# Patient Record
Sex: Male | Born: 1960 | Race: Black or African American | Hispanic: No | Marital: Single
Health system: Southern US, Community
[De-identification: ages and names within clinical notes are randomized; demographics above are authoritative.]

## PROBLEM LIST (undated history)

## (undated) DIAGNOSIS — I639 Cerebral infarction, unspecified: Secondary | ICD-10-CM

## (undated) DIAGNOSIS — I10 Essential (primary) hypertension: Secondary | ICD-10-CM

## (undated) HISTORY — PX: HAND SURGERY: SHX662

---

## 2003-09-18 ENCOUNTER — Emergency Department (HOSPITAL_COMMUNITY): Admission: EM | Admit: 2003-09-18 | Discharge: 2003-09-18 | Payer: Self-pay | Admitting: Emergency Medicine

## 2019-10-30 ENCOUNTER — Ambulatory Visit: Payer: Self-pay | Attending: Internal Medicine

## 2019-10-30 DIAGNOSIS — Z23 Encounter for immunization: Secondary | ICD-10-CM

## 2019-10-30 NOTE — Progress Notes (Signed)
   Covid-19 Vaccination Clinic  Name:  Chad Santos    MRN: [id number] DOB: [birth year]  10/30/2019  Chad Santos was observed post Covid-19 immunization for 15 minutes without incident. He was provided with Vaccine Information Sheet and instruction to access the V-Safe system.   Chad Santos was instructed to call 911 with any severe reactions post vaccine: [name] Difficulty breathing  . Swelling of face and throat  . A fast heartbeat  . A bad rash all over body  . Dizziness and weakness   Immunizations Administered    Name Date Dose VIS Date Route   Pfizer COVID-19 Vaccine 10/30/2019 10:25 AM 0.3 mL 07/04/2019 Intramuscular   Manufacturer: [place]   Lot: [id number]   [place]: [id number]

## 2019-11-24 ENCOUNTER — Ambulatory Visit: Payer: Self-pay | Attending: Internal Medicine

## 2019-11-24 DIAGNOSIS — Z23 Encounter for immunization: Secondary | ICD-10-CM

## 2019-11-24 NOTE — Progress Notes (Signed)
   Covid-19 Vaccination Clinic  Name:  Mubashir Gere    MRN: [id number] DOB: 17-Mar-1961  11/24/2019  Mr. Fresquez was observed post Covid-19 immunization for 15 minutes without incident. He was provided with Vaccine Information Sheet and instruction to access the V-Safe system.   Mr. Mazzoli was instructed to call 911 with any severe reactions post vaccine: [name] Difficulty breathing  . Swelling of face and throat  . A fast heartbeat  . A bad rash all over body  . Dizziness and weakness   Immunizations Administered    Name Date Dose VIS Date Route   Pfizer COVID-19 Vaccine 11/24/2019 10:06 AM 0.3 mL 09/17/2018 Intramuscular   Manufacturer: [place]   Lot: [id number]   [place]: [id number]

## 2020-07-10 ENCOUNTER — Other Ambulatory Visit: Payer: Self-pay

## 2020-07-10 ENCOUNTER — Emergency Department (HOSPITAL_COMMUNITY): Payer: Medicaid Other

## 2020-07-10 ENCOUNTER — Encounter (HOSPITAL_COMMUNITY): Payer: Self-pay

## 2020-07-10 ENCOUNTER — Emergency Department (HOSPITAL_COMMUNITY)
Admission: EM | Admit: 2020-07-10 | Discharge: 2020-07-11 | Disposition: A | Discharge status: Home / Self Care | Payer: Medicaid Other | Attending: Emergency Medicine | Admitting: Emergency Medicine

## 2020-07-10 DIAGNOSIS — M169 Osteoarthritis of hip, unspecified: Secondary | ICD-10-CM

## 2020-07-10 DIAGNOSIS — Z5321 Procedure and treatment not carried out due to patient leaving prior to being seen by health care provider: Secondary | ICD-10-CM | POA: Insufficient documentation

## 2020-07-10 DIAGNOSIS — M25551 Pain in right hip: Secondary | ICD-10-CM | POA: Insufficient documentation

## 2020-07-10 HISTORY — DX: Osteoarthritis of hip, unspecified: M16.9

## 2020-07-10 NOTE — ED Triage Notes (Signed)
Patient complains of right hip pain x 2-3 days, today complains too painful to walk. Denies trauma

## 2020-07-11 NOTE — ED Notes (Signed)
Pt called for vitals no answer. °

## 2020-12-31 ENCOUNTER — Emergency Department (HOSPITAL_COMMUNITY)
Admission: EM | Admit: 2020-12-31 | Discharge: 2020-12-31 | Disposition: A | Discharge status: Home / Self Care | Payer: Medicaid Other | Attending: Emergency Medicine | Admitting: Emergency Medicine

## 2020-12-31 ENCOUNTER — Emergency Department (HOSPITAL_COMMUNITY): Payer: Medicaid Other

## 2020-12-31 ENCOUNTER — Other Ambulatory Visit: Payer: Self-pay

## 2020-12-31 DIAGNOSIS — G4700 Insomnia, unspecified: Secondary | ICD-10-CM | POA: Diagnosis present

## 2020-12-31 DIAGNOSIS — R519 Headache, unspecified: Secondary | ICD-10-CM | POA: Insufficient documentation

## 2020-12-31 DIAGNOSIS — F458 Other somatoform disorders: Secondary | ICD-10-CM | POA: Diagnosis not present

## 2020-12-31 LAB — CBC WITH DIFFERENTIAL/PLATELET
Abs Immature Granulocytes: 0.01 10*3/uL (ref 0.00–0.07)
Basophils Absolute: 0 10*3/uL (ref 0.0–0.1)
Basophils Relative: 1 %
Eosinophils Absolute: 0 10*3/uL (ref 0.0–0.5)
Eosinophils Relative: 1 %
HCT: 48.8 % (ref 39.0–52.0)
Hemoglobin: 16.1 g/dL (ref 13.0–17.0)
Immature Granulocytes: 0 %
Lymphocytes Relative: 28 %
Lymphs Abs: 1.8 10*3/uL (ref 0.7–4.0)
MCH: 31.1 pg (ref 26.0–34.0)
MCHC: 33 g/dL (ref 30.0–36.0)
MCV: 94.2 fL (ref 80.0–100.0)
Monocytes Absolute: 0.5 10*3/uL (ref 0.1–1.0)
Monocytes Relative: 8 %
Neutro Abs: 3.9 10*3/uL (ref 1.7–7.7)
Neutrophils Relative %: 62 %
Platelets: 212 10*3/uL (ref 150–400)
RBC: 5.18 MIL/uL (ref 4.22–5.81)
RDW: 14.2 % (ref 11.5–15.5)
WBC: 6.2 10*3/uL (ref 4.0–10.5)
nRBC: 0 % (ref 0.0–0.2)

## 2020-12-31 LAB — URINALYSIS, ROUTINE W REFLEX MICROSCOPIC
Bilirubin Urine: NEGATIVE
Glucose, UA: NEGATIVE mg/dL
Hgb urine dipstick: NEGATIVE
Ketones, ur: NEGATIVE mg/dL
Nitrite: NEGATIVE
Protein, ur: NEGATIVE mg/dL
Specific Gravity, Urine: 1.015 (ref 1.005–1.030)
pH: 6 (ref 5.0–8.0)

## 2020-12-31 LAB — BASIC METABOLIC PANEL
Anion gap: 9 (ref 5–15)
BUN: 12 mg/dL (ref 8–23)
CO2: 27 mmol/L (ref 22–32)
Calcium: 9.5 mg/dL (ref 8.9–10.3)
Chloride: 101 mmol/L (ref 98–111)
Creatinine, Ser: 1.2 mg/dL (ref 0.61–1.24)
GFR, Estimated: >60 mL/min (ref >60)
Glucose, Bld: 105 mg/dL — ABNORMAL HIGH (ref 70–99)
Potassium: 4.6 mmol/L (ref 3.5–5.1)
Sodium: 137 mmol/L (ref 135–145)

## 2020-12-31 MED ORDER — TRAZODONE HCL 50 MG PO TABS: 50.0000 mg | ORAL_TABLET | Freq: Every evening | ORAL | 0 refills | Status: DC | PRN

## 2020-12-31 NOTE — Discharge Instructions (Addendum)
I have given you a prescription medication called Trazodone for sleep. You may try an over the counter medication as well like Unisom. All of these medications can give you daytime sleepiness and you may wish to start with 1/2 a tablet. I would advise trying an UNISOM first and then if it is not working for you, filling the prescription medication. I have attached a print out about sleep hygiene as well. You may follow up with neurology or a primary care doctor for your symptoms. Your head  ct and labs were normal. Your blood pressure was elevated today and should be rechecked and managed by a primary care doctor.

## 2020-12-31 NOTE — ED Provider Notes (Signed)
[facility] EMERGENCY DEPART[facility] Provider Note   CSN: [id number] Arrival date & time: 12/31/20  0715     History Chief Complaint  Patient presents with   Insomnia    Chad Santos is a 60 y.o. male who presents emergency department with chief complaint of insomnia.  Patient states that 3 days ago he began having problems sleeping.  He states that he is able to get to sleep at about 10 PM every night but wakes up around 12 or 1 and that is unable to return to sleep.  This is never happened to him before.  He has tried melatonin without relief.  He denies any use of stimulants, alcohol, illicit drugs.  He does not drink coffee.  He denies any increased stress.  At the same time he began having frontal headaches which are new.  He denies any other neurologic abnormalities with these headaches and has not tried any medications to relieve the symptoms.  He does not currently have a headache. Patient has also experienced intermittent lobus sensation with swallowing.  This is also new since development of the insomnia.  He has no difficulty swallowing water and has no abnormal throat sensation at this time.  The history is provided by the patient.  Insomnia This is a new problem. The current episode started more than 2 days ago. The problem occurs constantly. The problem has not changed since onset.Associated symptoms include headaches. Pertinent negatives include no chest pain, no abdominal pain and no shortness of breath. Nothing aggravates the symptoms. Nothing relieves the symptoms. Treatments tried: with melatonin. The treatment provided no relief.      No past medical history on file.  There are no problems to display for this patient.   No past surgical history on file.     No family history on file.     Home Medications Prior to Admission medications   Not on File    Allergies    Patient has no known allergies.  Review of Systems   Review of Systems   Constitutional: Negative.   HENT:  Positive for trouble swallowing (intermittent globus sensation).   Eyes: Negative.   Respiratory:  Negative for shortness of breath.   Cardiovascular:  Negative for chest pain.  Gastrointestinal:  Negative for abdominal pain.  Endocrine: Negative.   Genitourinary: Negative.   Musculoskeletal: Negative.   Neurological:  Positive for headaches.  Psychiatric/Behavioral:  Positive for sleep disturbance. Negative for agitation and dysphoric mood. The patient has insomnia. The patient is not nervous/anxious.    Physical Exam Updated Vital Signs BP (!) 185/110 (BP Location: Left Arm)   Pulse 72   Temp 98.4 F (36.9 C) (Oral)   Resp 16   Ht 6\' 1"  (1.854 m)   Wt 81.6 kg   SpO2 100%   BMI 23.75 kg/m   Physical Exam Vitals and nursing note reviewed.  Constitutional:      General: He is not in acute distress.    Appearance: He is well-developed. He is not diaphoretic.  HENT:     Head: Normocephalic and atraumatic.     Mouth/Throat:     Mouth: Mucous membranes are moist.     Comments: No palpable thyroid mass or lymphadenopathy Eyes:     General: No scleral icterus.    Conjunctiva/sclera: Conjunctivae normal.     Pupils: Pupils are equal, round, and reactive to light.     Comments: No horizontal, vertical or rotational nystagmus  Neck:  Comments: Full active and passive ROM without pain No midline or paraspinal tenderness No nuchal rigidity or meningeal signs Cardiovascular:     Rate and Rhythm: Normal rate and regular rhythm.  Pulmonary:     Effort: Pulmonary effort is normal. No respiratory distress.     Breath sounds: Normal breath sounds. No wheezing or rales.  Abdominal:     General: Bowel sounds are normal.     Palpations: Abdomen is soft.     Tenderness: There is no abdominal tenderness. There is no guarding or rebound.  Musculoskeletal:        General: Normal range of motion.     Cervical back: Normal range of motion and neck  supple.  Lymphadenopathy:     Cervical: No cervical adenopathy.  Skin:    General: Skin is warm and dry.     Findings: No rash.  Neurological:     Mental Status: He is alert and oriented to person, place, and time.     Cranial Nerves: No cranial nerve deficit.     Motor: No abnormal muscle tone.     Coordination: Coordination normal.     Comments: Mental Status:  Alert, oriented, thought content appropriate. Speech fluent without evidence of aphasia. Able to follow 2 step commands without difficulty.  Cranial Nerves:  II:  Peripheral visual fields grossly normal, pupils equal, round, reactive to light III,IV, VI: ptosis not present, extra-ocular motions intact bilaterally  V,VII: smile symmetric, facial light touch sensation equal VIII: hearing grossly normal bilaterally  IX,X: midline uvula rise  XI: bilateral shoulder shrug equal and strong XII: midline tongue extension  Motor:  5/5 in upper and lower extremities bilaterally including strong and equal grip strength and dorsiflexion/plantar flexion Sensory: Pinprick and light touch normal in all extremities.  Cerebellar: normal finger-to-nose with bilateral upper extremities Gait: normal gait and balance CV: distal pulses palpable throughout   Psychiatric:        Behavior: Behavior normal.        Thought Content: Thought content normal.        Judgment: Judgment normal.    ED Results / Procedures / Treatments   Labs (all labs ordered are listed, but only abnormal results are displayed) Labs Reviewed - No data to display  EKG None  Radiology No results found.  Procedures Procedures   Medications Ordered in ED Medications - No data to display  ED Course  I have reviewed the triage vital signs and the nursing notes.  Pertinent labs & imaging results that were available during my care of the patient were reviewed by me and considered in my medical decision making (see chart for details).    MDM  Rules/Calculators/A&P                          Patient here with insomnia, HA and globus sensation. I suspect all of his sxs are related to his lack of sleep. He has a negative CT head which I reviewed and interpreted. Labs , cbc, bmp, wnl. Will dc with neuro/pcp f/u, hand out on sleep hygiene and rx for trazadone to fill if otc meds  and lifestyle modifications do not help. No active h/a at this time. Doubt Hyperthyroidism with mild bradycardia. BP elevated and patient informed to f/u with a pcp. Discussed return precautions.  Final Clinical Impression(s) / ED Diagnoses Final diagnoses:  None    Rx / DC Orders ED Discharge Orders  None        Margarita Mail, PA-C 12/31/20 [id number]    Davonna Belling, MD 12/31/20 [phone]

## 2020-12-31 NOTE — ED Notes (Signed)
Pt was given urinal. Unable to give UA at this time.

## 2020-12-31 NOTE — ED Triage Notes (Signed)
Pt reports insomnia x 3 nights and feeling as if he is unable to clear something out of his throat when he swallows x 2 days. Also endorses headache x 2 days, taking advil without relief.

## 2021-01-20 ENCOUNTER — Emergency Department (HOSPITAL_COMMUNITY)
Admission: EM | Admit: 2021-01-20 | Discharge: 2021-01-20 | Disposition: A | Discharge status: Home / Self Care | Payer: Medicaid Other | Attending: Emergency Medicine | Admitting: Emergency Medicine

## 2021-01-20 ENCOUNTER — Emergency Department (HOSPITAL_COMMUNITY): Payer: Medicaid Other

## 2021-01-20 DIAGNOSIS — R066 Hiccough: Secondary | ICD-10-CM | POA: Insufficient documentation

## 2021-01-20 DIAGNOSIS — R630 Anorexia: Secondary | ICD-10-CM | POA: Insufficient documentation

## 2021-01-20 DIAGNOSIS — R5383 Other fatigue: Secondary | ICD-10-CM | POA: Insufficient documentation

## 2021-01-20 LAB — COMPREHENSIVE METABOLIC PANEL
ALT: 11 U/L (ref 0–44)
AST: 14 U/L — ABNORMAL LOW (ref 15–41)
Albumin: 3.6 g/dL (ref 3.5–5.0)
Alkaline Phosphatase: 104 U/L (ref 38–126)
Anion gap: 10 (ref 5–15)
BUN: 16 mg/dL (ref 8–23)
CO2: 26 mmol/L (ref 22–32)
Calcium: 9.2 mg/dL (ref 8.9–10.3)
Chloride: 101 mmol/L (ref 98–111)
Creatinine, Ser: 1.52 mg/dL — ABNORMAL HIGH (ref 0.61–1.24)
GFR, Estimated: 52 mL/min — ABNORMAL LOW (ref >60)
Glucose, Bld: 94 mg/dL (ref 70–99)
Potassium: 3.8 mmol/L (ref 3.5–5.1)
Sodium: 137 mmol/L (ref 135–145)
Total Bilirubin: 0.6 mg/dL (ref 0.3–1.2)
Total Protein: 6.9 g/dL (ref 6.5–8.1)

## 2021-01-20 LAB — CBC
HCT: 46.8 % (ref 39.0–52.0)
Hemoglobin: 15.5 g/dL (ref 13.0–17.0)
MCH: 31 pg (ref 26.0–34.0)
MCHC: 33.1 g/dL (ref 30.0–36.0)
MCV: 93.6 fL (ref 80.0–100.0)
Platelets: 241 10*3/uL (ref 150–400)
RBC: 5 MIL/uL (ref 4.22–5.81)
RDW: 13.8 % (ref 11.5–15.5)
WBC: 6.2 10*3/uL (ref 4.0–10.5)
nRBC: 0 % (ref 0.0–0.2)

## 2021-01-20 LAB — URINALYSIS, ROUTINE W REFLEX MICROSCOPIC
Bilirubin Urine: NEGATIVE
Glucose, UA: NEGATIVE mg/dL
Hgb urine dipstick: NEGATIVE
Ketones, ur: NEGATIVE mg/dL
Nitrite: NEGATIVE
Protein, ur: NEGATIVE mg/dL
Specific Gravity, Urine: 1.023 (ref 1.005–1.030)
pH: 6 (ref 5.0–8.0)

## 2021-01-20 LAB — LIPASE, BLOOD: Lipase: 25 U/L (ref 11–51)

## 2021-01-20 LAB — TSH: TSH: 1.081 u[IU]/mL (ref 0.350–4.500)

## 2021-01-20 MED ORDER — SODIUM CHLORIDE 0.9 % IV BOLUS
1000.0000 mL | Freq: Once | INTRAVENOUS | Status: AC
  Administered 2021-01-20: 1000 mL via INTRAVENOUS

## 2021-01-20 NOTE — Social Work (Signed)
CSW met with Pt at bedside. Pt reports that he recently moved to [place] from [place] and has yet to establish with a PCP. Pt currently receives SSDI and has applied for Medicaid.  CSW gave resources for [facility]. Also added added info to AVS.

## 2021-01-20 NOTE — ED Triage Notes (Signed)
Pt c/o loss of appetite & hiccups x3 days. No additional complaints

## 2021-01-20 NOTE — ED Provider Notes (Signed)
Emergency Medicine Provider Triage Evaluation Note  Chad Santos , a 60 y.o. male  was evaluated in triage.  Pt complains of decreased appetite and hiccups x3 days.  Patient reports that he was able to eat some chicken yesterday without incident.  Patient reports that hiccups are intermittent.  Has associated epigastric pain when hiccups are present.  Pain does not radiate.  No alleviating or aggravating symptoms of symptoms.  Review of Systems  Positive: Decreased appetite, hiccups Negative: Fever, chills, URI symptoms, shortness of breath, chest pain, nausea, vomiting, diarrhea  Physical Exam  BP (!) 163/107 (BP Location: Right Arm)   Pulse 89   Temp 97.6 F (36.4 C) (Oral)   Resp 15   SpO2 99%  Gen:   Awake, no distress   Resp:  Normal effort  MSK:   Moves extremities without difficulty  Other:  Abdomen soft, nondistended, nontender  Medical Decision Making  Medically screening exam initiated at 3:44 PM.  Appropriate orders placed.  Chad Santos was informed that the remainder of the evaluation will be completed by another provider, this initial triage assessment does not replace that evaluation, and the importance of remaining in the ED until their evaluation is complete.  The patient appears stable so that the remainder of the work up may be completed by another provider.      Loni Beckwith, PA-C 01/20/21 1546    Dorie Rank, MD 01/21/21 [phone]

## 2021-01-20 NOTE — Discharge Instructions (Addendum)
Try to keep her self hydrated.  Your kidney function looks mildly increased from what it was before.  Your lab work was reassuring today otherwise however.

## 2021-01-20 NOTE — ED Notes (Signed)
Discharge instructions reviewed and explained, pt verbalized understanding.

## 2021-02-02 NOTE — ED Provider Notes (Signed)
[facility] EMERGENCY DEPARTMENT Provider Note   CSN: [id number] Arrival date & time: 01/20/21  1442     History Chief Complaint  Patient presents with   no appetite   Hiccups    Chad Santos is a 60 y.o. male.  HPI Patient presents with hiccups and loss of appetite.  He has not run for 3 days.  No abdominal pains.  No lightheadedness or dizziness.  Has had some fatigue.  No headache.  No confusion.  Did have some issues with sleeping recently and started on trazodone to take as needed.  No weight loss.    No past medical history on file.  There are no problems to display for this patient.   No past surgical history on file.     No family history on file.     Home Medications Prior to Admission medications   Medication Sig Start Date End Date Taking? Authorizing Provider  Ibuprofen (ADVIL PO) Take 1 tablet by mouth daily as needed (pain).    [provider]  traZODone (DESYREL) 50 MG tablet Take 1 tablet (50 mg total) by mouth at bedtime as needed for up to 20 days for sleep. 12/31/20 01/20/21  Margarita Mail, PA-C    Allergies    Patient has no known allergies.  Review of Systems   Review of Systems  Constitutional:  Positive for appetite change.  HENT:  Negative for congestion.   Respiratory:  Negative for shortness of breath.   Cardiovascular:  Negative for chest pain.  Gastrointestinal:  Negative for abdominal pain.       Hiccups  Genitourinary:  Negative for flank pain.  Musculoskeletal:  Negative for back pain.  Neurological:  Negative for weakness.  Psychiatric/Behavioral:  Negative for confusion.    Physical Exam Updated Vital Signs BP (!) 164/98 (BP Location: Right Arm)   Pulse (!) 56   Temp 98.6 F (37 C) (Oral)   Resp 16   SpO2 100%   Physical Exam  ED Results / Procedures / Treatments   Labs (all labs ordered are listed, but only abnormal results are displayed) Labs Reviewed  COMPREHENSIVE METABOLIC PANEL -  Abnormal; Notable for the following components:      Result Value   Creatinine, Ser 1.52 (*)    AST 14 (*)    GFR, Estimated 52 (*)    All other components within normal limits  URINALYSIS, ROUTINE W REFLEX MICROSCOPIC - Abnormal; Notable for the following components:   Color, Urine AMBER (*)    APPearance HAZY (*)    Leukocytes,Ua MODERATE (*)    Bacteria, UA RARE (*)    All other components within normal limits  LIPASE, BLOOD  CBC  TSH    EKG None  Radiology No results found.  Procedures Procedures   Medications Ordered in ED Medications  sodium chloride 0.9 % bolus 1,000 mL (0 mLs Intravenous Stopped 01/20/21 1940)    ED Course  I have reviewed the triage vital signs and the nursing notes.  Pertinent labs & imaging results that were available during my care of the patient were reviewed by me and considered in my medical decision making (see chart for details).    MDM Rules/Calculators/A&P                          Patient presents with hiccups.  Has had for the last few days.  Work-up reassuring.  Benign exam.  Chest x-ray negative.  Recent negative head CT.  Will need outpatient follow-up.  Discussed with patient about possibility of occult malignancy.  TSH reassuring.  Only few hiccups here.  Discharge home  Final Clinical Impression(s) / ED Diagnoses Final diagnoses:  Hiccups  Loss of appetite    Rx / DC Orders ED Discharge Orders     None        Davonna Belling, MD 02/02/21 [phone]

## 2021-05-16 ENCOUNTER — Other Ambulatory Visit: Payer: Self-pay

## 2021-05-16 ENCOUNTER — Ambulatory Visit (HOSPITAL_COMMUNITY)
Admission: EM | Admit: 2021-05-16 | Discharge: 2021-05-16 | Disposition: A | Discharge status: Home / Self Care | Payer: Self-pay | Attending: Emergency Medicine | Admitting: Emergency Medicine

## 2021-05-16 ENCOUNTER — Encounter (HOSPITAL_COMMUNITY): Payer: Self-pay

## 2021-05-16 DIAGNOSIS — G4700 Insomnia, unspecified: Secondary | ICD-10-CM

## 2021-05-16 DIAGNOSIS — M79605 Pain in left leg: Secondary | ICD-10-CM

## 2021-05-16 DIAGNOSIS — M79604 Pain in right leg: Secondary | ICD-10-CM

## 2021-05-16 MED ORDER — CYCLOBENZAPRINE HCL 5 MG PO TABS: 5.0000 mg | ORAL_TABLET | Freq: Two times a day (BID) | ORAL | 0 refills | Status: DC | PRN

## 2021-05-16 MED ORDER — IBUPROFEN 800 MG PO TABS: 800.0000 mg | ORAL_TABLET | Freq: Three times a day (TID) | ORAL | 0 refills | Status: DC

## 2021-05-16 MED ORDER — TRAZODONE HCL 50 MG PO TABS: 50.0000 mg | ORAL_TABLET | Freq: Every day | ORAL | 0 refills | Status: DC

## 2021-05-16 NOTE — ED Provider Notes (Signed)
[facility]    CSN: [id number] Arrival date & time: 05/16/21  9675      History   Chief Complaint Chief Complaint  Patient presents with   Leg Pain   Insomnia    HPI Chad Santos is a 60 y.o. male.   Patient presents with  mania particularly staying asleep throughout the night.  Endorses that he typically goes to bed around 10 in a dark quiet environment, he takes approximately 2 hours to fall asleep and he only sleeps for about 2 to 3 hours.  Denies any changes in daily routine or stress.  Symptoms have occurred before, taking trazodone as needed.  Ran out of medication has no PCP.  denies dizziness, lightheadedness, weakness, blurry vision, headaches, changes in.  No pertinent medical history.  Concerned with intermittent bilateral leg pain present for 2 weeks.  Denies any injury or precipitating event.  Endorses that onset was abrupt.  Describes pain as sharp.  Denies numbness or tingling.  Range of motion is intact.  Has not attempted treatment of symptoms.  History reviewed. No pertinent past medical history.  There are no problems to display for this patient.   History reviewed. No pertinent surgical history.     Home Medications    Prior to Admission medications   Medication Sig Start Date End Date Taking? Authorizing Provider  cyclobenzaprine (FLEXERIL) 5 MG tablet Take 1 tablet (5 mg total) by mouth 2 (two) times daily as needed for muscle spasms. 05/16/21  Yes Milano Rosevear R, NP  ibuprofen (ADVIL) 800 MG tablet Take 1 tablet (800 mg total) by mouth 3 (three) times daily. 05/16/21  Yes Sharlett Lienemann, Leitha Schuller, NP  traZODone (DESYREL) 50 MG tablet Take 1 tablet (50 mg total) by mouth at bedtime. 05/16/21  Yes Brody Kump, Leitha Schuller, NP    Family History Family History  Family history unknown: Yes    Social History     Allergies   Patient has no known allergies.   Review of Systems Review of Systems  Constitutional: Negative.   Respiratory:  Negative.    Endocrine: Negative.   Skin: Negative.   Allergic/Immunologic: Negative.   Neurological: Negative.     Physical Exam Triage Vital Signs ED Triage Vitals  Enc Vitals Group     BP 05/16/21 1051 (!) 191/118     Pulse Rate 05/16/21 1051 62     Resp --      Temp 05/16/21 1051 98.5 F (36.9 C)     Temp Source 05/16/21 1051 Oral     SpO2 05/16/21 1051 97 %     Weight --      Height --      Head Circumference --      Peak Flow --      Pain Score 05/16/21 1100 6     Pain Loc --      Pain Edu? --      Excl. in [place]? --    No data found.  Updated Vital Signs BP (!) 177/106 (BP Location: Left Arm)   Pulse 62   Temp 98.5 F (36.9 C) (Oral)   SpO2 97%   Visual Acuity Right Eye Distance:   Left Eye Distance:   Bilateral Distance:    Right Eye Near:   Left Eye Near:    Bilateral Near:     Physical Exam Constitutional:      Appearance: Normal appearance. He is normal weight.  HENT:     Head: Normocephalic.  Eyes:  Extraocular Movements: Extraocular movements intact.  Cardiovascular:     Rate and Rhythm: Normal rate and regular rhythm.     Pulses: Normal pulses.     Heart sounds: Normal heart sounds.  Pulmonary:     Effort: Pulmonary effort is normal.     Breath sounds: Normal breath sounds.  Musculoskeletal:        General: Normal range of motion.     Comments: Range of motion of bilateral lower extremities intact no erythema no swelling no tenderness noted  Skin:    General: Skin is warm and dry.  Neurological:     General: No focal deficit present.     Mental Status: He is alert and oriented to person, place, and time. Mental status is at baseline.     Cranial Nerves: No cranial nerve deficit.     Sensory: No sensory deficit.     Motor: No weakness.     Coordination: Coordination normal.     Gait: Gait normal.  Psychiatric:        Mood and Affect: Mood normal.        Behavior: Behavior normal.     UC Treatments / Results  Labs (all labs  ordered are listed, but only abnormal results are displayed) Labs Reviewed - No data to display  EKG   Radiology No results found.  Procedures Procedures (including critical care time)  Medications Ordered in UC Medications - No data to display  Initial Impression / Assessment and Plan / UC Course  I have reviewed the triage vital signs and the nursing notes.  Pertinent labs & imaging results that were available during my care of the patient were reviewed by me and considered in my medical decision making (see chart for details).  Lateral leg pain Insomnia  1.  Trazodone 50 mg at bedtime prn .  Refilled 2.  PCP referral placed, encouraged and discussed good sleep hygiene with patient 3.  Ibuprofen 800 mg 3 times daily as needed 4.  Cyclobenzaprine 5 mg twice daily as needed 5.  Orthopedic follow-up as needed 6.  Daily stretching, heat or ice for comfort, pillows for support Final Clinical Impressions(s) / UC Diagnoses   Final diagnoses:  Bilateral leg pain  Insomnia, unspecified type     Discharge Instructions      You may use your trazodone 1 pill nightly before bedtime to help with your sleeping A primary care referral has been placed for you to help you find a doctor to manage her insomnia  For your leg pain you may use ibuprofen up to 3 times a day as needed, you may also use Flexeril twice a day as needed for additional comfort, be mindful that Flexeril may make you drowsy  Can use heating pad or ice in 15-minute intervals over the affected areas  If leg pain is persistent or recur she may follow-up with orthopedic specialist as needed  May follow-up with urgent care as needed for your symptoms   ED Prescriptions     Medication Sig Dispense Auth. Provider   traZODone (DESYREL) 50 MG tablet Take 1 tablet (50 mg total) by mouth at bedtime. 20 tablet Emberlee Sortino R, NP   ibuprofen (ADVIL) 800 MG tablet Take 1 tablet (800 mg total) by mouth 3 (three) times  daily. 21 tablet Vickye Astorino R, NP   cyclobenzaprine (FLEXERIL) 5 MG tablet Take 1 tablet (5 mg total) by mouth 2 (two) times daily as needed for muscle spasms. 30 tablet Shantara Goosby,  Leitha Schuller, NP      PDMP not reviewed this encounter.   Hans Eden, NP 05/16/21 1135

## 2021-05-16 NOTE — ED Triage Notes (Signed)
Pt presents with bilateral leg pain and sleep insomnia X 2 weeks.

## 2021-05-16 NOTE — Discharge Instructions (Signed)
You may use your trazodone 1 pill nightly before bedtime to help with your sleeping A primary care referral has been placed for you to help you find a doctor to manage her insomnia  For your leg pain you may use ibuprofen up to 3 times a day as needed, you may also use Flexeril twice a day as needed for additional comfort, be mindful that Flexeril may make you drowsy  Can use heating pad or ice in 15-minute intervals over the affected areas  If leg pain is persistent or recur she may follow-up with orthopedic specialist as needed  May follow-up with urgent care as needed for your symptoms

## 2021-05-30 ENCOUNTER — Encounter (HOSPITAL_COMMUNITY): Payer: Self-pay | Admitting: Emergency Medicine

## 2021-05-30 ENCOUNTER — Ambulatory Visit (HOSPITAL_COMMUNITY)
Admission: EM | Admit: 2021-05-30 | Discharge: 2021-05-30 | Disposition: A | Discharge status: Home / Self Care | Payer: Medicaid Other | Attending: Internal Medicine | Admitting: Internal Medicine

## 2021-05-30 ENCOUNTER — Other Ambulatory Visit: Payer: Self-pay

## 2021-05-30 DIAGNOSIS — G4700 Insomnia, unspecified: Secondary | ICD-10-CM | POA: Insufficient documentation

## 2021-05-30 DIAGNOSIS — R634 Abnormal weight loss: Secondary | ICD-10-CM | POA: Insufficient documentation

## 2021-05-30 LAB — CBC WITH DIFFERENTIAL/PLATELET
Abs Immature Granulocytes: 0.01 10*3/uL (ref 0.00–0.07)
Basophils Absolute: 0 10*3/uL (ref 0.0–0.1)
Basophils Relative: 1 %
Eosinophils Absolute: 0.1 10*3/uL (ref 0.0–0.5)
Eosinophils Relative: 1 %
HCT: 46 % (ref 39.0–52.0)
Hemoglobin: 15.2 g/dL (ref 13.0–17.0)
Immature Granulocytes: 0 %
Lymphocytes Relative: 29 %
Lymphs Abs: 1.7 10*3/uL (ref 0.7–4.0)
MCH: 31.3 pg (ref 26.0–34.0)
MCHC: 33 g/dL (ref 30.0–36.0)
MCV: 94.7 fL (ref 80.0–100.0)
Monocytes Absolute: 0.5 10*3/uL (ref 0.1–1.0)
Monocytes Relative: 8 %
Neutro Abs: 3.6 10*3/uL (ref 1.7–7.7)
Neutrophils Relative %: 61 %
Platelets: 198 10*3/uL (ref 150–400)
RBC: 4.86 MIL/uL (ref 4.22–5.81)
RDW: 13.9 % (ref 11.5–15.5)
WBC: 5.9 10*3/uL (ref 4.0–10.5)
nRBC: 0 % (ref 0.0–0.2)

## 2021-05-30 MED ORDER — MELATONIN 3-10 MG PO TABS: 1.0000 | ORAL_TABLET | Freq: Every day | ORAL | 0 refills | Status: DC

## 2021-05-30 MED ORDER — TRAZODONE HCL 100 MG PO TABS: 100.0000 mg | ORAL_TABLET | Freq: Every evening | ORAL | 0 refills | Status: DC | PRN

## 2021-05-30 NOTE — Discharge Instructions (Signed)
You need to establish care with a primary care physician If he continues to lose weight, you will benefit from an emergency department visit for imaging Please take medications as prescribed If symptoms worsen please return to urgent care to be reevaluated.

## 2021-05-30 NOTE — ED Provider Notes (Signed)
[facility]    CSN: [id number] Arrival date & time: 05/30/21  0813      History   Chief Complaint Chief Complaint  Patient presents with   loss of appetite    HPI Chad Santos is a 60 y.o. male with a history of insomnia comes to urgent care complaining of insomnia which has been ongoing for the past 3 weeks.  Patient has difficulty not only with initiation of sleep but also stain asleep.  She wakes up after about 2 hours or so and is unable to sleep.  He denies any depressed mood, anhedonia, suicidal or homicidal ideations.  No significant stressors.  No fever or chills.  Patient presented to the emergency department about 3 months ago with similar complaints.  He was prescribed trazodone but says that is not helping him much.  Patient also complains of a 10 pound weight loss.  No nausea, vomiting or change in bowel movements.  No history of colonoscopy.  Patient does not have a primary care physician.[name]   HPI  History reviewed. No pertinent past medical history.  There are no problems to display for this patient.   Past Surgical History:  Procedure Laterality Date   HAND SURGERY Right        Home Medications    Prior to Admission medications   Medication Sig Start Date End Date Taking? Authorizing Provider  Melatonin 3-10 MG TABS Take 1 tablet by mouth at bedtime. 05/30/21  Yes Rigby Swamy, Myrene Galas, MD  traZODone (DESYREL) 100 MG tablet Take 1 tablet (100 mg total) by mouth at bedtime as needed for sleep. 05/30/21  Yes Michaila Kenney, Myrene Galas, MD  ibuprofen (ADVIL) 800 MG tablet Take 1 tablet (800 mg total) by mouth 3 (three) times daily. 05/16/21   Hans Eden, NP    Family History Family History  Family history unknown: Yes    Social History Social History   Tobacco Use   Smoking status: Every Day    Types: Cigarettes   Smokeless tobacco: Never  Vaping Use   Vaping Use: Never used  Substance Use Topics   Alcohol use: Yes    Comment:  occasionally   Drug use: Never     Allergies   Patient has no known allergies.   Review of Systems Review of Systems  Constitutional:  Positive for unexpected weight change. Negative for chills and fever.  Musculoskeletal: Negative.   Neurological:  Negative for dizziness, light-headedness and headaches.  Psychiatric/Behavioral: Negative.      Physical Exam Triage Vital Signs ED Triage Vitals  Enc Vitals Group     BP 05/30/21 0910 (!) 162/97     Pulse Rate 05/30/21 0910 64     Resp 05/30/21 0910 (!) 24     Temp 05/30/21 0910 98.1 F (36.7 C)     Temp Source 05/30/21 0910 Oral     SpO2 05/30/21 0910 97 %     Weight --      Height --      Head Circumference --      Peak Flow --      Pain Score 05/30/21 0906 0     Pain Loc --      Pain Edu? --      Excl. in [place]? --    No data found.  Updated Vital Signs BP (!) 162/97 (BP Location: Right Arm)   Pulse 64   Temp 98.1 F (36.7 C) (Oral)   Resp (!) 24   SpO2  97%   Visual Acuity Right Eye Distance:   Left Eye Distance:   Bilateral Distance:    Right Eye Near:   Left Eye Near:    Bilateral Near:     Physical Exam Vitals and nursing note reviewed.  Constitutional:      General: He is not in acute distress.    Appearance: He is not ill-appearing, toxic-appearing or diaphoretic.  Cardiovascular:     Rate and Rhythm: Normal rate and regular rhythm.     Pulses: Normal pulses.     Heart sounds: Normal heart sounds.  Pulmonary:     Effort: Pulmonary effort is normal.     Breath sounds: Normal breath sounds.  Abdominal:     General: Bowel sounds are normal.     Palpations: Abdomen is soft.  Skin:    General: Skin is warm.     Capillary Refill: Capillary refill takes less than 2 seconds.  Neurological:     General: No focal deficit present.     Mental Status: He is alert and oriented to person, place, and time.     UC Treatments / Results  Labs (all labs ordered are listed, but only abnormal results are  displayed) Labs Reviewed  COMPREHENSIVE METABOLIC PANEL  CBC WITH DIFFERENTIAL/PLATELET    EKG   Radiology No results found.  Procedures Procedures (including critical care time)  Medications Ordered in UC Medications - No data to display  Initial Impression / Assessment and Plan / UC Course  I have reviewed the triage vital signs and the nursing notes.  Pertinent labs & imaging results that were available during my care of the patient were reviewed by me and considered in my medical decision making (see chart for details).     1.  Insomnia: Increase trazodone dose 200 mg at bedtime Add melatonin at bedtime If symptoms worsen or if there is no improvement patient is advised to return to urgent care to be reevaluated  2.  Unintentional weight loss, CBC, CMP Patient is advised to increase oral intake If he continues to lose weight he will need imaging to evaluate him further.  Patient is advised to go to the emergency department if his condition does not improve in the coming couple of weeks. Final Clinical Impressions(s) / UC Diagnoses   Final diagnoses:  Insomnia, unspecified type  Loss of weight     Discharge Instructions      You need to establish care with a primary care physician If he continues to lose weight, you will benefit from an emergency department visit for imaging Please take medications as prescribed If symptoms worsen please return to urgent care to be reevaluated.   ED Prescriptions     Medication Sig Dispense Auth. Provider   traZODone (DESYREL) 100 MG tablet Take 1 tablet (100 mg total) by mouth at bedtime as needed for sleep. 30 tablet Shalece Staffa, Myrene Galas, MD   Melatonin 3-10 MG TABS Take 1 tablet by mouth at bedtime. -- Chase Picket, MD      PDMP not reviewed this encounter.   Chase Picket, MD 05/30/21 1020

## 2021-05-30 NOTE — ED Triage Notes (Signed)
Patient complains of loss of appetite for 3 weeks.  Patient also complains of insomnia.  Patient was seen 05/16/2021 with complaints of insomnia as well.  Patient speculates approx 10 pound weight loss.  Has no pcp

## 2021-05-31 ENCOUNTER — Telehealth (HOSPITAL_COMMUNITY): Payer: Self-pay | Admitting: Emergency Medicine

## 2021-06-03 ENCOUNTER — Other Ambulatory Visit: Payer: Self-pay

## 2021-06-03 ENCOUNTER — Emergency Department (HOSPITAL_COMMUNITY)
Admission: EM | Admit: 2021-06-03 | Discharge: 2021-06-04 | Disposition: A | Discharge status: Home / Self Care | Payer: Medicaid Other | Attending: Medical | Admitting: Medical

## 2021-06-03 DIAGNOSIS — M545 Low back pain, unspecified: Secondary | ICD-10-CM | POA: Insufficient documentation

## 2021-06-03 DIAGNOSIS — R630 Anorexia: Secondary | ICD-10-CM | POA: Insufficient documentation

## 2021-06-03 DIAGNOSIS — Z5321 Procedure and treatment not carried out due to patient leaving prior to being seen by health care provider: Secondary | ICD-10-CM | POA: Insufficient documentation

## 2021-06-03 LAB — CBC WITH DIFFERENTIAL/PLATELET
Abs Immature Granulocytes: 0.02 10*3/uL (ref 0.00–0.07)
Basophils Absolute: 0 10*3/uL (ref 0.0–0.1)
Basophils Relative: 1 %
Eosinophils Absolute: 0.1 10*3/uL (ref 0.0–0.5)
Eosinophils Relative: 1 %
HCT: 46.8 % (ref 39.0–52.0)
Hemoglobin: 15.2 g/dL (ref 13.0–17.0)
Immature Granulocytes: 0 %
Lymphocytes Relative: 29 %
Lymphs Abs: 1.6 10*3/uL (ref 0.7–4.0)
MCH: 31 pg (ref 26.0–34.0)
MCHC: 32.5 g/dL (ref 30.0–36.0)
MCV: 95.3 fL (ref 80.0–100.0)
Monocytes Absolute: 0.5 10*3/uL (ref 0.1–1.0)
Monocytes Relative: 9 %
Neutro Abs: 3.2 10*3/uL (ref 1.7–7.7)
Neutrophils Relative %: 60 %
Platelets: 198 10*3/uL (ref 150–400)
RBC: 4.91 MIL/uL (ref 4.22–5.81)
RDW: 13.9 % (ref 11.5–15.5)
WBC: 5.4 10*3/uL (ref 4.0–10.5)
nRBC: 0 % (ref 0.0–0.2)

## 2021-06-03 LAB — COMPREHENSIVE METABOLIC PANEL
ALT: 9 U/L (ref 0–44)
AST: 13 U/L — ABNORMAL LOW (ref 15–41)
Albumin: 3.6 g/dL (ref 3.5–5.0)
Alkaline Phosphatase: 90 U/L (ref 38–126)
Anion gap: 10 (ref 5–15)
BUN: 14 mg/dL (ref 8–23)
CO2: 23 mmol/L (ref 22–32)
Calcium: 9 mg/dL (ref 8.9–10.3)
Chloride: 103 mmol/L (ref 98–111)
Creatinine, Ser: 1.3 mg/dL — ABNORMAL HIGH (ref 0.61–1.24)
GFR, Estimated: >60 mL/min (ref >60)
Glucose, Bld: 96 mg/dL (ref 70–99)
Potassium: 4 mmol/L (ref 3.5–5.1)
Sodium: 136 mmol/L (ref 135–145)
Total Bilirubin: 0.6 mg/dL (ref 0.3–1.2)
Total Protein: 6.6 g/dL (ref 6.5–8.1)

## 2021-06-03 LAB — LACTIC ACID, PLASMA: Lactic Acid, Venous: 0.8 mmol/L (ref 0.5–1.9)

## 2021-06-03 MED ORDER — ACETAMINOPHEN 325 MG PO TABS: 650.0000 mg | ORAL_TABLET | Freq: Once | ORAL | Status: DC

## 2021-06-03 NOTE — ED Triage Notes (Signed)
Pt reports lack of appetite x 3.5 weeks. Also reports lower back pain with radiation down BLE without injury. Pt denies any infectious symptoms, although is febrile.

## 2021-06-03 NOTE — ED Provider Notes (Signed)
Emergency Medicine Provider Triage Evaluation Note  Chad Santos , a 60 y.o. male  was evaluated in triage.  Pt complains of gradual onset, constant, achy, lower back pain radiating down BLEs for the past 2 weeks. Denies trauma to same. Has been seen at UC twice for same without relief. Also experiencing insomnia and decreased appetite with nausea. No vomiting. No abdominal pain. He was unaware he had a fever until he got here - febrile at 100.6. He denies rhinorrhea, cough, sneezing, sore throat, body aches. Denies recent sick contacts. No urinary retention, urinary or bowel incontinence, saddle anesthesia. No hx IVDA.  Review of Systems  Positive: + back pain, leg pain, nausea, decreased appetite  Negative: - urinary retention, urinary or bowel incontinence, saddle anesthesia, cough, body aches,   Physical Exam  BP (!) 158/101 (BP Location: Right Arm)   Pulse 86   Temp (!) 100.6 F (38.1 C)   Resp 18   SpO2 94%  Gen:   Awake, no distress   Resp:  Normal effort  MSK:   Moves extremities without difficulty  Other:  + midline L spinal TTP with associated bilateral paralumbar musculature TTP. ROM intact to back. Strength 5/5 to BLEs with hip flexion, knee flexion/extention, dorsiflexion/plantarflexion. Sensation intact. 2+ PT pulses bilaterally.   Medical Decision Making  Medically screening exam initiated at 12:54 PM.  Appropriate orders placed.  Alexandros Hohman was informed that the remainder of the evaluation will be completed by another provider, this initial triage assessment does not replace that evaluation, and the importance of remaining in the ED until their evaluation is complete.     Eustaquio Maize, PA-C 06/03/21 1257    Charlesetta Shanks, MD 06/03/21 [phone]

## 2021-06-03 NOTE — ED Notes (Signed)
Pt didn't answered when called for room

## 2021-06-09 ENCOUNTER — Other Ambulatory Visit: Payer: Self-pay

## 2021-06-09 ENCOUNTER — Ambulatory Visit (INDEPENDENT_AMBULATORY_CARE_PROVIDER_SITE_OTHER): Payer: Self-pay

## 2021-06-09 ENCOUNTER — Ambulatory Visit (HOSPITAL_COMMUNITY)
Admission: EM | Admit: 2021-06-09 | Discharge: 2021-06-09 | Disposition: A | Discharge status: Home / Self Care | Payer: Self-pay | Attending: Urgent Care | Admitting: Urgent Care

## 2021-06-09 ENCOUNTER — Encounter (HOSPITAL_COMMUNITY): Payer: Self-pay

## 2021-06-09 DIAGNOSIS — I10 Essential (primary) hypertension: Secondary | ICD-10-CM

## 2021-06-09 DIAGNOSIS — M545 Low back pain, unspecified: Secondary | ICD-10-CM

## 2021-06-09 DIAGNOSIS — M5416 Radiculopathy, lumbar region: Secondary | ICD-10-CM

## 2021-06-09 MED ORDER — PREDNISONE 20 MG PO TABS: ORAL_TABLET | ORAL | 0 refills | Status: DC

## 2021-06-09 MED ORDER — LOSARTAN POTASSIUM 50 MG PO TABS: 50.0000 mg | ORAL_TABLET | Freq: Every day | ORAL | 0 refills | Status: DC

## 2021-06-09 MED ORDER — TIZANIDINE HCL 4 MG PO TABS: 4.0000 mg | ORAL_TABLET | Freq: Every day | ORAL | 0 refills | Status: DC

## 2021-06-09 NOTE — ED Triage Notes (Signed)
Pt c/o lower back pain radiating down both legs x1wk. Denies injury.

## 2021-06-09 NOTE — ED Provider Notes (Signed)
[place] [facility]   MRN: [id number] DOB: [birth year]  Subjective:   Chad Santos is a 60 y.o. male presenting for 1 week history of persistent low back pain that radiates into either leg.  No fall, trauma, weakness, numbness or tingling, saddle paresthesia, history of back problems or back injuries, no changes to bowel or urinary habits.  Patient denies history of high blood pressure.  Has never had to take any medications.  Does not get regular follow-up.  He did go to the emergency room a few days ago but left without being seen.  Blood work is as below.  No current facility-administered medications for this encounter.  Current Outpatient Medications:    ibuprofen (ADVIL) 800 MG tablet, Take 1 tablet (800 mg total) by mouth 3 (three) times daily., Disp: 21 tablet, Rfl: 0   Melatonin 3-10 MG TABS, Take 1 tablet by mouth at bedtime., Disp: , Rfl: 0   traZODone (DESYREL) 100 MG tablet, Take 1 tablet (100 mg total) by mouth at bedtime as needed for sleep., Disp: 30 tablet, Rfl: 0   No Known Allergies  History reviewed. No pertinent past medical history.   Past Surgical History:  Procedure Laterality Date   HAND SURGERY Right     Family History  Family history unknown: Yes    Social History   Tobacco Use   Smoking status: Every Day    Types: Cigarettes   Smokeless tobacco: Never  Vaping Use   Vaping Use: Never used  Substance Use Topics   Alcohol use: Yes    Comment: occasionally   Drug use: Never    ROS   Objective:   Vitals: BP (!) 172/114 (BP Location: Right Arm)   Pulse 96   Temp 98.5 F (36.9 C) (Oral)   Resp 18   SpO2 93%   BP Readings from Last 3 Encounters:  06/09/21 (!) 172/114  06/03/21 (!) 161/127  05/30/21 (!) 162/97   BP recheck was 163/103.  Physical Exam Constitutional:      General: He is not in acute distress.    Appearance: Normal appearance. He is well-developed. He is not ill-appearing, toxic-appearing or diaphoretic.   HENT:     Head: Normocephalic and atraumatic.     Right Ear: External ear normal.     Left Ear: External ear normal.     Nose: Nose normal.     Mouth/Throat:     Mouth: Mucous membranes are moist.     Pharynx: Oropharynx is clear.  Eyes:     General: No scleral icterus.    Extraocular Movements: Extraocular movements intact.     Pupils: Pupils are equal, round, and reactive to light.  Cardiovascular:     Rate and Rhythm: Normal rate and regular rhythm.     Heart sounds: Normal heart sounds. No murmur heard.   No friction rub. No gallop.  Pulmonary:     Effort: Pulmonary effort is normal. No respiratory distress.     Breath sounds: Normal breath sounds. No stridor. No wheezing, rhonchi or rales.  Musculoskeletal:     Comments: Full range of motion throughout.  Strength 5/5 for upper and lower extremities.  Patient ambulates without any assistance at expected pace.  No ecchymosis, swelling, lacerations or abrasions.  Patient does have mild paraspinal muscle tenderness along the lumbar region of his back excluding the midline.  Negative straight leg raise bilaterally.  Skin:    General: Skin is warm and dry.  Neurological:  Mental Status: He is alert and oriented to person, place, and time.     Cranial Nerves: No cranial nerve deficit.     Motor: No weakness.     Coordination: Coordination normal.     Gait: Gait normal.     Deep Tendon Reflexes: Reflexes normal.     Comments: Negative Romberg and pronator drift, no facial asymmetry.  Psychiatric:        Mood and Affect: Mood normal.        Behavior: Behavior normal.        Thought Content: Thought content normal.    DG Lumbar Spine Complete  Result Date: 06/09/2021 CLINICAL DATA:  lumbar radiculopathy, low back pain EXAM: LUMBAR SPINE - COMPLETE 4+ VIEW COMPARISON:  None. FINDINGS: There is no evidence of lumbar spine fracture. Alignment is normal. Intervertebral disc spaces are maintained. Atherosclerotic calcification of  the abdominal aorta. IMPRESSION: Negative. Electronically Signed   By: Davina Poke D.O.   On: 06/09/2021 09:31     Recent Results (from the past 2160 hour(s))  CBC with Differential     Status: None   Collection Time: 05/30/21 10:49 AM  Result Value Ref Range   WBC 5.9 4.0 - 10.5 K/uL   RBC 4.86 4.22 - 5.81 MIL/uL   Hemoglobin 15.2 13.0 - 17.0 g/dL   HCT 46.0 39.0 - 52.0 %   MCV 94.7 80.0 - 100.0 fL   MCH 31.3 26.0 - 34.0 pg   MCHC 33.0 30.0 - 36.0 g/dL   RDW 13.9 11.5 - 15.5 %   Platelets 198 150 - 400 K/uL   nRBC 0.0 0.0 - 0.2 %   Neutrophils Relative % 61 %   Neutro Abs 3.6 1.7 - 7.7 K/uL   Lymphocytes Relative 29 %   Lymphs Abs 1.7 0.7 - 4.0 K/uL   Monocytes Relative 8 %   Monocytes Absolute 0.5 0.1 - 1.0 K/uL   Eosinophils Relative 1 %   Eosinophils Absolute 0.1 0.0 - 0.5 K/uL   Basophils Relative 1 %   Basophils Absolute 0.0 0.0 - 0.1 K/uL   Immature Granulocytes 0 %   Abs Immature Granulocytes 0.01 0.00 - 0.07 K/uL    Comment: Performed at [facility], [address] [place], [place] [address]  CBC with Differential     Status: None   Collection Time: 06/03/21  1:00 PM  Result Value Ref Range   WBC 5.4 4.0 - 10.5 K/uL   RBC 4.91 4.22 - 5.81 MIL/uL   Hemoglobin 15.2 13.0 - 17.0 g/dL   HCT 46.8 39.0 - 52.0 %   MCV 95.3 80.0 - 100.0 fL   MCH 31.0 26.0 - 34.0 pg   MCHC 32.5 30.0 - 36.0 g/dL   RDW 13.9 11.5 - 15.5 %   Platelets 198 150 - 400 K/uL   nRBC 0.0 0.0 - 0.2 %   Neutrophils Relative % 60 %   Neutro Abs 3.2 1.7 - 7.7 K/uL   Lymphocytes Relative 29 %   Lymphs Abs 1.6 0.7 - 4.0 K/uL   Monocytes Relative 9 %   Monocytes Absolute 0.5 0.1 - 1.0 K/uL   Eosinophils Relative 1 %   Eosinophils Absolute 0.1 0.0 - 0.5 K/uL   Basophils Relative 1 %   Basophils Absolute 0.0 0.0 - 0.1 K/uL   Immature Granulocytes 0 %   Abs Immature Granulocytes 0.02 0.00 - 0.07 K/uL    Comment: Performed at [facility], [address] [place], [place] [address]   Comprehensive metabolic panel     Status: Abnormal   Collection Time: 06/03/21  1:00 PM  Result Value Ref Range   Sodium 136 135 - 145 mmol/L   Potassium 4.0 3.5 - 5.1 mmol/L   Chloride 103 98 - 111 mmol/L   CO2 23 22 - 32 mmol/L   Glucose, Bld 96 70 - 99 mg/dL    Comment: Glucose reference range applies only to samples taken after fasting for at least 8 hours.   BUN 14 8 - 23 mg/dL   Creatinine, Ser 1.30 (H) 0.61 - 1.24 mg/dL   Calcium 9.0 8.9 - 10.3 mg/dL   Total Protein 6.6 6.5 - 8.1 g/dL   Albumin 3.6 3.5 - 5.0 g/dL   AST 13 (L) 15 - 41 U/L   ALT 9 0 - 44 U/L   Alkaline Phosphatase 90 38 - 126 U/L   Total Bilirubin 0.6 0.3 - 1.2 mg/dL   GFR, Estimated >60 >60 mL/min    Comment: (NOTE) Calculated using the CKD-EPI Creatinine Equation (2021)    Anion gap 10 5 - 15    Comment: Performed at [facility] [address] [place], [place] [address]  Lactic acid, plasma     Status: None   Collection Time: 06/03/21  1:00 PM  Result Value Ref Range   Lactic Acid, Venous 0.8 0.5 - 1.9 mmol/L    Comment: Performed at [facility] [address], [place], [place] [address]     Assessment and Plan :   PDMP not reviewed this encounter.  1. Acute bilateral low back pain without sciatica   2. Lumbar radiculopathy   3. Essential hypertension    Recommended an oral prednisone course, muscle relaxant.  Patient will likely need an MRI and therefore needs to have a consultation with a spine specialist.  Follow-up with a new primary care provider, provided him with information for this.  Discussed his longstanding history of elevated blood pressure readings and need to start management for hypertension.  He is to use losartan once daily.  Recommended hypertensive friendly diet.  Blood work reviewed from patient as he went to the emergency room but left without being seen.  Follow-up very closely with PCP as above. Counseled patient on potential for adverse effects with  medications prescribed/recommended today, ER and return-to-clinic precautions discussed, patient verbalized understanding.    Jaynee Eagles, [place] 06/09/21 [phone]

## 2021-06-09 NOTE — Discharge Instructions (Addendum)
Please make sure you follow-up with the spine specialist.  Make an appointment with them today to be seen in the near future.  There will likely try to pursue an MRI of your back for the pain that she have.  In the meantime, you can start the oral prednisone course and take Tylenol with it long-term.  Use the muscle relaxant at bedtime.  Please make sure that you start blood pressure medication as we discussed.  Follow-up with a new primary care provider by making an appointment with one of the providers listed.   For diabetes or elevated blood sugar, please make sure you are limiting and avoiding starchy, carbohydrate foods like pasta, breads, sweet breads, pastry, rice, potatoes, desserts. These foods can elevate your blood sugar. Also, limit and avoid drinks that contain a lot of sugar such as sodas, sweet teas, fruit juices.  Drinking plain water will be much more helpful, try 64 ounces of water daily.  It is okay to flavor your water naturally by cutting cucumber, lemon, mint or lime, placing it in a picture with water and drinking it over a period of 24-48 hours as long as it remains refrigerated.  For elevated blood pressure, make sure you are monitoring salt in your diet.  Do not eat restaurant foods and limit processed foods at home. I highly recommend you prepare and cook your own foods at home.  Processed foods include things like frozen meals, pre-seasoned meats and dinners, deli meats, canned foods as these foods contain a high amount of sodium/salt.  Make sure you are paying attention to sodium labels on foods you buy at the grocery store. Buy your spices separately such as garlic powder, onion powder, cumin, cayenne, parsley flakes so that you can avoid seasonings that contain salt. However, salt-free seasonings are available and can be used, an example is Mrs. Dash and includes a lot of different mixtures that do not contain salt.  Lastly, when cooking using oils that are healthier for you is  important. This includes olive oil, avocado oil, canola oil. We have discussed a lot of foods to avoid but below is a list of foods that can be very healthy to use in your diet whether it is for diabetes, cholesterol, high blood pressure, or in general healthy eating.  Salads - kale, spinach, cabbage, spring mix, arugula Fruits - avocadoes, berries (blueberries, raspberries, blackberries), apples, oranges, pomegranate, grapefruit, kiwi Vegetables - asparagus, cauliflower, broccoli, green beans, brussel sprouts, bell peppers, beets; stay away from or limit starchy vegetables like potatoes, carrots, peas Other general foods - kidney beans, egg whites, almonds, walnuts, sunflower seeds, pumpkin seeds, fat free yogurt, almond milk, flax seeds, quinoa, oats  Meat - It is better to eat lean meats and limit your red meat including pork to once a week.  Wild caught fish, chicken breast are good options as they tend to be leaner sources of good protein. Still be mindful of the sodium labels for the meats you buy.  DO NOT EAT ANY FOODS ON THIS LIST THAT YOU ARE ALLERGIC TO. For more specific needs, I highly recommend consulting a dietician or nutritionist but this can definitely be a good starting point.

## 2021-06-21 ENCOUNTER — Ambulatory Visit (HOSPITAL_COMMUNITY)
Admission: EM | Admit: 2021-06-21 | Discharge: 2021-06-21 | Disposition: A | Discharge status: Home / Self Care | Payer: Medicaid Other | Attending: Family Medicine | Admitting: Family Medicine

## 2021-06-21 ENCOUNTER — Encounter (HOSPITAL_COMMUNITY): Payer: Self-pay

## 2021-06-21 ENCOUNTER — Other Ambulatory Visit: Payer: Self-pay

## 2021-06-21 DIAGNOSIS — G4700 Insomnia, unspecified: Secondary | ICD-10-CM | POA: Insufficient documentation

## 2021-06-21 DIAGNOSIS — R630 Anorexia: Secondary | ICD-10-CM | POA: Insufficient documentation

## 2021-06-21 LAB — COMPREHENSIVE METABOLIC PANEL
ALT: 12 U/L (ref 0–44)
AST: 15 U/L (ref 15–41)
Albumin: 3.6 g/dL (ref 3.5–5.0)
Alkaline Phosphatase: 92 U/L (ref 38–126)
Anion gap: 9 (ref 5–15)
BUN: 19 mg/dL (ref 8–23)
CO2: 25 mmol/L (ref 22–32)
Calcium: 9.1 mg/dL (ref 8.9–10.3)
Chloride: 102 mmol/L (ref 98–111)
Creatinine, Ser: 1.92 mg/dL — ABNORMAL HIGH (ref 0.61–1.24)
GFR, Estimated: 39 mL/min — ABNORMAL LOW (ref >60)
Glucose, Bld: 202 mg/dL — ABNORMAL HIGH (ref 70–99)
Potassium: 4.3 mmol/L (ref 3.5–5.1)
Sodium: 136 mmol/L (ref 135–145)
Total Bilirubin: 0.7 mg/dL (ref 0.3–1.2)
Total Protein: 6.9 g/dL (ref 6.5–8.1)

## 2021-06-21 LAB — CBC WITH DIFFERENTIAL/PLATELET
Abs Immature Granulocytes: 0.03 10*3/uL (ref 0.00–0.07)
Basophils Absolute: 0 10*3/uL (ref 0.0–0.1)
Basophils Relative: 1 %
Eosinophils Absolute: 0 10*3/uL (ref 0.0–0.5)
Eosinophils Relative: 1 %
HCT: 49.5 % (ref 39.0–52.0)
Hemoglobin: 16.3 g/dL (ref 13.0–17.0)
Immature Granulocytes: 0 %
Lymphocytes Relative: 25 %
Lymphs Abs: 1.7 10*3/uL (ref 0.7–4.0)
MCH: 30.9 pg (ref 26.0–34.0)
MCHC: 32.9 g/dL (ref 30.0–36.0)
MCV: 93.9 fL (ref 80.0–100.0)
Monocytes Absolute: 0.5 10*3/uL (ref 0.1–1.0)
Monocytes Relative: 7 %
Neutro Abs: 4.7 10*3/uL (ref 1.7–7.7)
Neutrophils Relative %: 66 %
Platelets: 226 10*3/uL (ref 150–400)
RBC: 5.27 MIL/uL (ref 4.22–5.81)
RDW: 13.5 % (ref 11.5–15.5)
WBC: 7 10*3/uL (ref 4.0–10.5)
nRBC: 0 % (ref 0.0–0.2)

## 2021-06-21 LAB — FOLATE: Folate: 13.6 ng/mL (ref >5.9)

## 2021-06-21 LAB — VITAMIN D 25 HYDROXY (VIT D DEFICIENCY, FRACTURES): Vit D, 25-Hydroxy: 10.2 ng/mL — ABNORMAL LOW (ref 30–100)

## 2021-06-21 LAB — TSH: TSH: 0.598 u[IU]/mL (ref 0.350–4.500)

## 2021-06-21 LAB — VITAMIN B12: Vitamin B-12: 262 pg/mL (ref 180–914)

## 2021-06-21 MED ORDER — ESZOPICLONE 3 MG PO TABS
3.0000 mg | ORAL_TABLET | Freq: Every evening | ORAL | 0 refills | Status: DC | PRN
Start: 2021-06-21 — End: 2021-06-29

## 2021-06-21 NOTE — ED Triage Notes (Signed)
Pt reports he can not sleep more than 2 hrs for the past week; low appetite x 2 weeks.

## 2021-06-21 NOTE — ED Provider Notes (Signed)
  [facility]   [id number] 06/21/21 Arrival Time: 1218  ASSESSMENT & PLAN:  1. Insomnia, unspecified type   2. Decreased appetite    Unclear cause of current symptoms. Placed in PCP queue. Labs pending. Trial of: Meds ordered this encounter  Medications   Eszopiclone 3 MG TABS    Sig: Take 1 tablet (3 mg total) by mouth at bedtime as needed. Take immediately before bedtime    Dispense:  10 tablet    Refill:  0  Discussed not for freq use.  Reviewed expectations re: course of current medical issues. Questions answered. Outlined signs and symptoms indicating need for more acute intervention. Understanding verbalized. After Visit Summary given.   SUBJECTIVE: History from: patient. Chad Santos is a 60 y.o. male who reports insomnia; past 2 weeks. No life changes. Able to fall asleep but wakes after 1-2 hours. Trazodone recently Rx not helping. Also with decreased appetite. No n/v/d. No recent illnesses.   OBJECTIVE:  Vitals:   06/21/21 1404  BP: (!) 165/102  Pulse: (!) 104  Resp: (!) 26  Temp: 98 F (36.7 C)  TempSrc: Oral  SpO2: 94%    Recheck P: 92 Recheck RR: 18  General appearance: alert; no distress; appears well-nourished Eyes: PERRLA; EOMI; conjunctiva normal HENT: [place]; AT Neck: supple  CV: reg Lungs: speaks full sentences without difficulty; unlabored Abd: benign Extremities: no edema Skin: warm and dry Neurologic: normal gait Psychological: alert and cooperative; normal mood and affect   No Known Allergies  History reviewed. No pertinent past medical history. Social History   Socioeconomic History   Marital status: Single    Spouse name: Not on file   Number of children: Not on file   Years of education: Not on file   Highest education level: Not on file  Occupational History   Not on file  Tobacco Use   Smoking status: Every Day    Types: Cigarettes   Smokeless tobacco: Never  Vaping Use   Vaping Use: Never used   Substance and Sexual Activity   Alcohol use: Yes    Comment: occasionally   Drug use: Never   Sexual activity: Not on file  Other Topics Concern   Not on file  Social History Narrative   Not on file   Social Determinants of Health   Financial Resource Strain: Not on file  Food Insecurity: Not on file  Transportation Needs: Not on file  Physical Activity: Not on file  Stress: Not on file  Social Connections: Not on file  Intimate Partner Violence: Not on file   Family History  Family history unknown: Yes   Past Surgical History:  Procedure Laterality Date   HAND SURGERY Right      Vanessa Kick, MD 06/21/21 1729

## 2021-06-21 NOTE — Discharge Instructions (Addendum)
You have had labs (blood work) drawn today. We will call you with any significant abnormalities or if there is need to begin or change treatment or pursue further follow up.  You may also review your test results online through MyChart. If you do not have a MyChart account, instructions to sign up should be on your discharge paperwork.  

## 2021-06-22 ENCOUNTER — Telehealth (HOSPITAL_COMMUNITY): Payer: Self-pay | Admitting: Emergency Medicine

## 2021-06-22 MED ORDER — VITAMIN D (ERGOCALCIFEROL) 1.25 MG (50000 UNIT) PO CAPS: 50000.0000 [IU] | ORAL_CAPSULE | ORAL | 0 refills | Status: DC

## 2021-06-29 ENCOUNTER — Other Ambulatory Visit: Payer: Self-pay

## 2021-06-29 ENCOUNTER — Ambulatory Visit (HOSPITAL_COMMUNITY)
Admission: EM | Admit: 2021-06-29 | Discharge: 2021-06-29 | Disposition: A | Discharge status: Home / Self Care | Payer: Self-pay | Attending: Emergency Medicine | Admitting: Emergency Medicine

## 2021-06-29 ENCOUNTER — Encounter (HOSPITAL_COMMUNITY): Payer: Self-pay

## 2021-06-29 DIAGNOSIS — G4700 Insomnia, unspecified: Secondary | ICD-10-CM

## 2021-06-29 DIAGNOSIS — R630 Anorexia: Secondary | ICD-10-CM

## 2021-06-29 MED ORDER — HYDROXYZINE HCL 50 MG PO TABS: 50.0000 mg | ORAL_TABLET | Freq: Four times a day (QID) | ORAL | 1 refills | Status: DC | PRN

## 2021-06-29 NOTE — Discharge Instructions (Addendum)
Try the hydroxyzine.  This helps with anxiety as well as sleep.  Please follow-up with a primary care provider ASAP for monitoring of your blood pressure, even though it is better today.  You need a colonoscopy, and possibly a further work-up to determine the cause of your weight loss over the past 3 months.  Moderate exercise for 30 minutes 3-4 times a week can be helpful, make sure you drink plenty of electrolyte containing fluids.  try Ensure and boost to make sure that you have adequate nutritional/caloric intake.  Below is a list of primary care practices who are taking new patients for you to follow-up with.  [facility] -multiple locations.  See website at [email or web address]  [facility] internal medicine clinic Ground Floor - [facility] [facility], [place], [place], [place] [address] [phone]  [facility] at [facility] [address] [place], [place] [address] [phone]  [place], [place] [address] [phone]  [name] [phone] [place] [address]  [facility]: [place]  [phone]  [facility]: [address] [place]  [phone]  [facility] : [address] [place] [phone]  [facility]: [address] [place] [address] [phone]  [facility]: [place] [phone]  Dr. Blanchie Serve [place] [address]  [phone]  Go to www.goodrx.com  or www.costplusdrugs.com to look up your medications. This will give you a list of where you can find your prescriptions at  the most affordable prices. Or ask the pharmacist what the cash price is, or if they have any other discount programs available to help make your medication more affordable. This can be less expensive than what you would pay with insurance.

## 2021-06-29 NOTE — ED Provider Notes (Signed)
HPI  SUBJECTIVE:  Chad Santos is a 60 y.o. male who presents with 2 weeks of insomnia, decreased appetite.  He has been seen here twice for this, was tried initially on trazodone, then [place].  He states neither of them worked.  He states that he falls asleep but wakes after 1 to 2 hours.  He denies increased stress, but states that the 1 year anniversary of his mother's death is approaching and he has been thinking about it a lot.  He denies excessive feelings of guilt, crying, loss of pleasure in usual activities, racing thoughts, other manic behaviors, anxiety.  He states that he is not exercising.  He drinks 2-3 sodas a day, states that the last 1 was late at night.  He denies excess alcohol use.  He also reports unintentional 10 pound weight loss for the over the past 3 months.  No fevers, night sweats, chest pain, shortness of breath, abdominal pain, cough, heat or cold intolerance.  He has tried over-the-counter sleep aids, trazodone and Lunesta without improvement in his symptoms.  No aggravating factors.  He smokes per chart review.  He has never had a colonoscopy.  Has a history of hypertension, and is currently taking his losartan which was started here.  No history of cancer, pulmonary disease, thyroid disease.  PMD: None.  History reviewed. No pertinent past medical history.  Past Surgical History:  Procedure Laterality Date   HAND SURGERY Right     Family History  Family history unknown: Yes    Social History   Tobacco Use   Smoking status: Every Day    Types: Cigarettes   Smokeless tobacco: Never  Vaping Use   Vaping Use: Never used  Substance Use Topics   Alcohol use: Yes    Comment: occasionally   Drug use: Never    No current facility-administered medications for this encounter.  Current Outpatient Medications:    hydrOXYzine (ATARAX) 50 MG tablet, Take 1 tablet (50 mg total) by mouth every 6 (six) hours as needed for anxiety. May take 2 tabs at night, Disp:  30 tablet, Rfl: 1   ibuprofen (ADVIL) 800 MG tablet, Take 1 tablet (800 mg total) by mouth 3 (three) times daily., Disp: 21 tablet, Rfl: 0   losartan (COZAAR) 50 MG tablet, Take 1 tablet (50 mg total) by mouth daily., Disp: 90 tablet, Rfl: 0   Melatonin 3-10 MG TABS, Take 1 tablet by mouth at bedtime., Disp: , Rfl: 0   tiZANidine (ZANAFLEX) 4 MG tablet, Take 1 tablet (4 mg total) by mouth at bedtime., Disp: 30 tablet, Rfl: 0   Vitamin D, Ergocalciferol, (DRISDOL) 1.25 MG (50000 UNIT) CAPS capsule, Take 1 capsule (50,000 Units total) by mouth every 7 (seven) days., Disp: 6 capsule, Rfl: 0  No Known Allergies   ROS  As noted in HPI.   Physical Exam  BP (!) 145/96 (BP Location: Left Arm)   Pulse 88   Temp 98.4 F (36.9 C) (Oral)   Resp 18   SpO2 96%   Constitutional: Well developed, well nourished, no acute distress.  Appears anxious.  Well-dressed, well-nourished. Eyes:  EOMI, conjunctiva normal bilaterally HENT: Normocephalic, atraumatic,mucus membranes moist. Neck: No thyromegaly.  No thyroid tenderness. Respiratory: Normal inspiratory effort, lungs clear bilaterally Cardiovascular: Normal rate, regular rhythm, no murmurs rubs or gallops GI: nondistended. No suprapubic tenderness skin: No rash, skin intact Musculoskeletal: Moving all extremities equally Neurologic: Alert & oriented x 3, no focal neuro deficits Psychiatric: Speech and behavior appropriate.  No apparent auditory or visual hallucinations.  ED Course   Medications - No data to display  No orders of the defined types were placed in this encounter.   No results found for this or any previous visit (from the past 24 hour(s)). No results found.  ED Clinical Impression  1. Insomnia, unspecified type   2. Decreased appetite     ED Assessment/Plan  Previous records reviewed.  As noted in HPI.  Patient's primary concern today was the insomnia, so we addressed that today.  Patient with persistent  insomnia.  He denies manic or depressive symptoms.  He has no apparent suicidal/homicidal ideation, auditory or visual hallucinations.  Will try hydroxyzine, encouraged moderate exercise 3-4 times a week, limit caffeine intake in the afternoon, and to try Ensure/boost to maintain adequate nutrition.  Discussed with patient that he needs to be evaluated by a primary care provider for ongoing monitoring of his blood pressure, which is better today than over his last several visits.  Also discussed that he needs a colonoscopy, and may need additional work-up for malignancy due to the unintentional weight loss over the past 3 months.  Will provide primary care list and order assistance in finding a PMD.  Discussed MDM, treatment plan, and plan for follow-up with patient.patient agrees with plan.   Meds ordered this encounter  Medications   hydrOXYzine (ATARAX) 50 MG tablet    Sig: Take 1 tablet (50 mg total) by mouth every 6 (six) hours as needed for anxiety. May take 2 tabs at night    Dispense:  30 tablet    Refill:  1     *This clinic note was created using [employer]. Therefore, there may be occasional mistakes despite careful proofreading.  ?     Melynda Ripple, MD 06/29/21 1719

## 2021-06-29 NOTE — ED Triage Notes (Signed)
Pt reports he has not being able to sleep for the past 3 days, "not even a nap". Lower back pain and bilateral leg pain x 1 week.

## 2021-07-11 ENCOUNTER — Encounter (HOSPITAL_COMMUNITY): Payer: Self-pay

## 2021-07-11 ENCOUNTER — Ambulatory Visit (HOSPITAL_COMMUNITY)
Admission: EM | Admit: 2021-07-11 | Discharge: 2021-07-11 | Disposition: A | Discharge status: Home / Self Care | Payer: Medicaid Other | Attending: Internal Medicine | Admitting: Internal Medicine

## 2021-07-11 DIAGNOSIS — E559 Vitamin D deficiency, unspecified: Secondary | ICD-10-CM

## 2021-07-11 DIAGNOSIS — M5440 Lumbago with sciatica, unspecified side: Secondary | ICD-10-CM

## 2021-07-11 HISTORY — DX: Essential (primary) hypertension: I10

## 2021-07-11 MED ORDER — METHOCARBAMOL 500 MG PO TABS: 500.0000 mg | ORAL_TABLET | Freq: Every evening | ORAL | 0 refills | Status: DC | PRN

## 2021-07-11 MED ORDER — IBUPROFEN 600 MG PO TABS: 600.0000 mg | ORAL_TABLET | Freq: Three times a day (TID) | ORAL | 0 refills | Status: DC | PRN

## 2021-07-11 NOTE — ED Triage Notes (Signed)
Pt c/o pain in lower back radiating down both legs. Denies injury.

## 2021-07-11 NOTE — Discharge Instructions (Addendum)
Gentle range of motion exercise Heating pad use only 20 minutes on-20 minutes off cycle Please take medications as prescribed Return to urgent care if symptoms worsen.

## 2021-07-11 NOTE — ED Provider Notes (Signed)
[facility]    CSN: [id number] Arrival date & time: 07/11/21  0801      History   Chief Complaint Chief Complaint  Patient presents with   Back Pain    HPI Chad Santos is a 60 y.o. male comes to urgent care with lower back pain which started yesterday.  Patient has a history of lower back pain.  He fell sometime last week and is complaining of lower back pain.  Pain is currently 8 out of 10, sharp and throbbing, radiates to both lower extremities with no known relieving factors.  Patient has not tried any over-the-counter medications.  No bladder or bowel difficulties.  No bruising over the lower back.  No muscle stiffness or spasms.[name]   HPI  Past Medical History:  Diagnosis Date   Hypertension     There are no problems to display for this patient.   Past Surgical History:  Procedure Laterality Date   HAND SURGERY Right        Home Medications    Prior to Admission medications   Medication Sig Start Date End Date Taking? Authorizing Provider  losartan (COZAAR) 50 MG tablet Take 1 tablet (50 mg total) by mouth daily. 06/09/21  Yes Jaynee Eagles, PA-C  methocarbamol (ROBAXIN) 500 MG tablet Take 1 tablet (500 mg total) by mouth at bedtime as needed for muscle spasms. 07/11/21  Yes Barrie Sigmund, Myrene Galas, MD  ibuprofen (ADVIL) 600 MG tablet Take 1 tablet (600 mg total) by mouth every 8 (eight) hours as needed. 07/11/21   Michai Dieppa, Myrene Galas, MD  Vitamin D, Ergocalciferol, (DRISDOL) 1.25 MG (50000 UNIT) CAPS capsule Take 1 capsule (50,000 Units total) by mouth every 7 (seven) days. 06/22/21   Adelynne Joerger, Myrene Galas, MD    Family History Family History  Family history unknown: Yes    Social History Social History   Tobacco Use   Smoking status: Every Day    Packs/day: 1.00    Years: 30.00    Pack years: 30.00    Types: Cigarettes   Smokeless tobacco: Never  Vaping Use   Vaping Use: Never used  Substance Use Topics   Alcohol use: Yes    Comment:  occasionally   Drug use: Never     Allergies   Patient has no known allergies.   Review of Systems Review of Systems  Respiratory: Negative.    Gastrointestinal: Negative.   Genitourinary: Negative.   Musculoskeletal:  Positive for back pain and myalgias. Negative for arthralgias and neck pain.  Neurological: Negative.     Physical Exam Triage Vital Signs ED Triage Vitals  Enc Vitals Group     BP 07/11/21 0813 (!) 150/93     Pulse Rate 07/11/21 0813 80     Resp 07/11/21 0813 20     Temp 07/11/21 0813 98.4 F (36.9 C)     Temp Source 07/11/21 0813 Oral     SpO2 07/11/21 0813 97 %     Weight --      Height --      Head Circumference --      Peak Flow --      Pain Score 07/11/21 0814 8     Pain Loc --      Pain Edu? --      Excl. in [place]? --    No data found.  Updated Vital Signs BP (!) 150/93 (BP Location: Right Arm)    Pulse 80    Temp 98.4 F (36.9 C) (Oral)  Resp 20    SpO2 97%   Visual Acuity Right Eye Distance:   Left Eye Distance:   Bilateral Distance:    Right Eye Near:   Left Eye Near:    Bilateral Near:     Physical Exam Vitals and nursing note reviewed.  Constitutional:      General: He is not in acute distress.    Appearance: He is not ill-appearing.  Cardiovascular:     Rate and Rhythm: Normal rate and regular rhythm.     Pulses: Normal pulses.     Heart sounds: Normal heart sounds.  Pulmonary:     Effort: Pulmonary effort is normal.     Breath sounds: Normal breath sounds.  Abdominal:     General: Bowel sounds are normal.     Palpations: Abdomen is soft.  Musculoskeletal:        General: No swelling, tenderness, deformity or signs of injury. Normal range of motion.     Comments: Straight leg raise test is positive for the right lower extremity.  Skin:    General: Skin is warm.  Neurological:     Mental Status: He is alert.     UC Treatments / Results  Labs (all labs ordered are listed, but only abnormal results are  displayed) Labs Reviewed - No data to display  EKG   Radiology No results found.  Procedures Procedures (including critical care time)  Medications Ordered in UC Medications - No data to display  Initial Impression / Assessment and Plan / UC Course  I have reviewed the triage vital signs and the nursing notes.  Pertinent labs & imaging results that were available during my care of the patient were reviewed by me and considered in my medical decision making (see chart for details).     1.  Right-sided sciatica: Ibuprofen 600 mg every 6 hours as needed for pain Robaxin to be taken at bedtime as needed for muscle spasms-avoid driving or operating heavy machinery after taking Robaxin because it makes you drowsy If symptoms worsen please return to urgent care to be reevaluated Heating pad use on a 20-minute on-20 minutes off cycle.  Final Clinical Impressions(s) / UC Diagnoses   Final diagnoses:  Acute midline low back pain with sciatica, sciatica laterality unspecified  Vitamin D deficiency     Discharge Instructions      Gentle range of motion exercise Heating pad use only 20 minutes on-20 minutes off cycle Please take medications as prescribed Return to urgent care if symptoms worsen.     ED Prescriptions     Medication Sig Dispense Auth. Provider   ibuprofen (ADVIL) 600 MG tablet Take 1 tablet (600 mg total) by mouth every 8 (eight) hours as needed. 21 tablet Marquesa Rath, Myrene Galas, MD   methocarbamol (ROBAXIN) 500 MG tablet Take 1 tablet (500 mg total) by mouth at bedtime as needed for muscle spasms. 10 tablet Maleki Hippe, Myrene Galas, MD      I have reviewed the PDMP during this encounter.   Chase Picket, MD 07/11/21 [phone]

## 2021-08-21 ENCOUNTER — Inpatient Hospital Stay (HOSPITAL_COMMUNITY): Payer: Medicaid Other

## 2021-08-21 ENCOUNTER — Other Ambulatory Visit: Payer: Self-pay

## 2021-08-21 ENCOUNTER — Emergency Department (HOSPITAL_COMMUNITY): Payer: Medicaid Other

## 2021-08-21 ENCOUNTER — Encounter (HOSPITAL_COMMUNITY): Payer: Self-pay | Admitting: Emergency Medicine

## 2021-08-21 ENCOUNTER — Inpatient Hospital Stay (HOSPITAL_COMMUNITY)
Admission: EM | Admit: 2021-08-21 | Discharge: 2021-08-23 | DRG: 378 | Disposition: A | Discharge status: Home / Self Care | Payer: Medicaid Other | Attending: Internal Medicine | Admitting: Internal Medicine

## 2021-08-21 DIAGNOSIS — K449 Diaphragmatic hernia without obstruction or gangrene: Secondary | ICD-10-CM | POA: Diagnosis present

## 2021-08-21 DIAGNOSIS — R079 Chest pain, unspecified: Secondary | ICD-10-CM

## 2021-08-21 DIAGNOSIS — M5431 Sciatica, right side: Secondary | ICD-10-CM | POA: Diagnosis present

## 2021-08-21 DIAGNOSIS — R778 Other specified abnormalities of plasma proteins: Secondary | ICD-10-CM | POA: Diagnosis present

## 2021-08-21 DIAGNOSIS — M79605 Pain in left leg: Secondary | ICD-10-CM | POA: Diagnosis present

## 2021-08-21 DIAGNOSIS — K922 Gastrointestinal hemorrhage, unspecified: Secondary | ICD-10-CM | POA: Diagnosis not present

## 2021-08-21 DIAGNOSIS — F17210 Nicotine dependence, cigarettes, uncomplicated: Secondary | ICD-10-CM | POA: Diagnosis present

## 2021-08-21 DIAGNOSIS — D62 Acute posthemorrhagic anemia: Secondary | ICD-10-CM | POA: Diagnosis present

## 2021-08-21 DIAGNOSIS — R451 Restlessness and agitation: Secondary | ICD-10-CM | POA: Diagnosis present

## 2021-08-21 DIAGNOSIS — K648 Other hemorrhoids: Secondary | ICD-10-CM | POA: Diagnosis present

## 2021-08-21 DIAGNOSIS — R066 Hiccough: Secondary | ICD-10-CM | POA: Diagnosis present

## 2021-08-21 DIAGNOSIS — I10 Essential (primary) hypertension: Secondary | ICD-10-CM | POA: Diagnosis present

## 2021-08-21 DIAGNOSIS — K226 Gastro-esophageal laceration-hemorrhage syndrome: Secondary | ICD-10-CM | POA: Diagnosis present

## 2021-08-21 DIAGNOSIS — I248 Other forms of acute ischemic heart disease: Secondary | ICD-10-CM | POA: Diagnosis present

## 2021-08-21 DIAGNOSIS — D509 Iron deficiency anemia, unspecified: Secondary | ICD-10-CM | POA: Diagnosis not present

## 2021-08-21 DIAGNOSIS — N179 Acute kidney failure, unspecified: Secondary | ICD-10-CM | POA: Diagnosis present

## 2021-08-21 DIAGNOSIS — K635 Polyp of colon: Secondary | ICD-10-CM | POA: Diagnosis not present

## 2021-08-21 DIAGNOSIS — G4700 Insomnia, unspecified: Secondary | ICD-10-CM | POA: Diagnosis present

## 2021-08-21 DIAGNOSIS — R410 Disorientation, unspecified: Secondary | ICD-10-CM | POA: Diagnosis present

## 2021-08-21 DIAGNOSIS — M79604 Pain in right leg: Secondary | ICD-10-CM | POA: Diagnosis present

## 2021-08-21 DIAGNOSIS — D649 Anemia, unspecified: Secondary | ICD-10-CM | POA: Diagnosis not present

## 2021-08-21 DIAGNOSIS — K5730 Diverticulosis of large intestine without perforation or abscess without bleeding: Secondary | ICD-10-CM | POA: Diagnosis present

## 2021-08-21 DIAGNOSIS — Z20822 Contact with and (suspected) exposure to covid-19: Secondary | ICD-10-CM | POA: Diagnosis present

## 2021-08-21 DIAGNOSIS — Z79899 Other long term (current) drug therapy: Secondary | ICD-10-CM | POA: Diagnosis not present

## 2021-08-21 DIAGNOSIS — R7989 Other specified abnormal findings of blood chemistry: Secondary | ICD-10-CM | POA: Diagnosis present

## 2021-08-21 DIAGNOSIS — M79651 Pain in right thigh: Secondary | ICD-10-CM

## 2021-08-21 DIAGNOSIS — K921 Melena: Principal | ICD-10-CM

## 2021-08-21 DIAGNOSIS — K259 Gastric ulcer, unspecified as acute or chronic, without hemorrhage or perforation: Secondary | ICD-10-CM | POA: Diagnosis present

## 2021-08-21 DIAGNOSIS — R748 Abnormal levels of other serum enzymes: Secondary | ICD-10-CM

## 2021-08-21 LAB — HEPATIC FUNCTION PANEL
ALT: 26 U/L (ref 0–44)
AST: 29 U/L (ref 15–41)
Albumin: 3.3 g/dL — ABNORMAL LOW (ref 3.5–5.0)
Alkaline Phosphatase: 46 U/L (ref 38–126)
Bilirubin, Direct: 0.2 mg/dL (ref 0.0–0.2)
Indirect Bilirubin: 0.5 mg/dL (ref 0.3–0.9)
Total Bilirubin: 0.7 mg/dL (ref 0.3–1.2)
Total Protein: 5.5 g/dL — ABNORMAL LOW (ref 6.5–8.1)

## 2021-08-21 LAB — RAPID URINE DRUG SCREEN, HOSP PERFORMED
Amphetamines: NOT DETECTED
Barbiturates: NOT DETECTED
Benzodiazepines: NOT DETECTED
Cocaine: NOT DETECTED
Opiates: POSITIVE — AB
Tetrahydrocannabinol: POSITIVE — AB

## 2021-08-21 LAB — CBC
HCT: 30 % — ABNORMAL LOW (ref 39.0–52.0)
HCT: 27.6 % — ABNORMAL LOW (ref 39.0–52.0)
Hemoglobin: 9.9 g/dL — ABNORMAL LOW (ref 13.0–17.0)
Hemoglobin: 8.9 g/dL — ABNORMAL LOW (ref 13.0–17.0)
MCH: 31.1 pg (ref 26.0–34.0)
MCH: 30.9 pg (ref 26.0–34.0)
MCHC: 33 g/dL (ref 30.0–36.0)
MCHC: 32.2 g/dL (ref 30.0–36.0)
MCV: 94.3 fL (ref 80.0–100.0)
MCV: 95.8 fL (ref 80.0–100.0)
Platelets: 187 10*3/uL (ref 150–400)
Platelets: 165 10*3/uL (ref 150–400)
RBC: 3.18 MIL/uL — ABNORMAL LOW (ref 4.22–5.81)
RBC: 2.88 MIL/uL — ABNORMAL LOW (ref 4.22–5.81)
RDW: 15 % (ref 11.5–15.5)
RDW: 15.2 % (ref 11.5–15.5)
WBC: 7.6 10*3/uL (ref 4.0–10.5)
WBC: 8.6 10*3/uL (ref 4.0–10.5)
nRBC: 0 % (ref 0.0–0.2)
nRBC: 0 % (ref 0.0–0.2)

## 2021-08-21 LAB — RESP PANEL BY RT-PCR (FLU A&B, COVID) ARPGX2
Influenza A by PCR: NEGATIVE
Influenza B by PCR: NEGATIVE
SARS Coronavirus 2 by RT PCR: NEGATIVE

## 2021-08-21 LAB — HIV ANTIBODY (ROUTINE TESTING W REFLEX): HIV Screen 4th Generation wRfx: NONREACTIVE

## 2021-08-21 LAB — BASIC METABOLIC PANEL
Anion gap: 11 (ref 5–15)
BUN: 65 mg/dL — ABNORMAL HIGH (ref 8–23)
CO2: 26 mmol/L (ref 22–32)
Calcium: 8.7 mg/dL — ABNORMAL LOW (ref 8.9–10.3)
Chloride: 100 mmol/L (ref 98–111)
Creatinine, Ser: 1.91 mg/dL — ABNORMAL HIGH (ref 0.61–1.24)
GFR, Estimated: 39 mL/min — ABNORMAL LOW (ref >60)
Glucose, Bld: 109 mg/dL — ABNORMAL HIGH (ref 70–99)
Potassium: 3.5 mmol/L (ref 3.5–5.1)
Sodium: 137 mmol/L (ref 135–145)

## 2021-08-21 LAB — TYPE AND SCREEN
ABO/RH(D): O POS
Antibody Screen: NEGATIVE

## 2021-08-21 LAB — ABO/RH: ABO/RH(D): O POS

## 2021-08-21 LAB — LIPASE, BLOOD: Lipase: 25 U/L (ref 11–51)

## 2021-08-21 LAB — POC OCCULT BLOOD, ED: Occult Blood, Stool #1: POSITIVE — AB

## 2021-08-21 LAB — TROPONIN I (HIGH SENSITIVITY)
Troponin I (High Sensitivity): 38 ng/L — ABNORMAL HIGH (ref <18)
Troponin I (High Sensitivity): 31 ng/L — ABNORMAL HIGH (ref <18)

## 2021-08-21 MED ORDER — MORPHINE SULFATE (PF) 2 MG/ML IV SOLN
2.0000 mg | INTRAVENOUS | Status: DC | PRN
  Administered 2021-08-21 – 2021-08-23 (×3): 2 mg via INTRAVENOUS
  Filled 2021-08-21 (×3): qty 1

## 2021-08-21 MED ORDER — SODIUM CHLORIDE 0.9 % IV BOLUS (SEPSIS)
1000.0000 mL | Freq: Once | INTRAVENOUS | Status: AC
  Administered 2021-08-21: 1000 mL via INTRAVENOUS

## 2021-08-21 MED ORDER — ACETAMINOPHEN 650 MG RE SUPP: 650.0000 mg | Freq: Four times a day (QID) | RECTAL | Status: DC | PRN

## 2021-08-21 MED ORDER — PANTOPRAZOLE SODIUM 40 MG IV SOLR
40.0000 mg | Freq: Once | INTRAVENOUS | Status: AC
  Administered 2021-08-21: 40 mg via INTRAVENOUS
  Filled 2021-08-21: qty 40

## 2021-08-21 MED ORDER — ONDANSETRON HCL 4 MG PO TABS: 4.0000 mg | ORAL_TABLET | Freq: Four times a day (QID) | ORAL | Status: DC | PRN

## 2021-08-21 MED ORDER — HYDRALAZINE HCL 20 MG/ML IJ SOLN: 5.0000 mg | INTRAMUSCULAR | Status: DC | PRN

## 2021-08-21 MED ORDER — PANTOPRAZOLE SODIUM 40 MG IV SOLR
40.0000 mg | Freq: Two times a day (BID) | INTRAVENOUS | Status: DC
  Administered 2021-08-21 – 2021-08-23 (×4): 40 mg via INTRAVENOUS
  Filled 2021-08-21 (×4): qty 40

## 2021-08-21 MED ORDER — METHOCARBAMOL 500 MG PO TABS: 500.0000 mg | ORAL_TABLET | Freq: Four times a day (QID) | ORAL | Status: DC | PRN

## 2021-08-21 MED ORDER — PROCHLORPERAZINE EDISYLATE 10 MG/2ML IJ SOLN
10.0000 mg | Freq: Once | INTRAMUSCULAR | Status: AC
  Administered 2021-08-21: 10 mg via INTRAVENOUS
  Filled 2021-08-21: qty 2

## 2021-08-21 MED ORDER — LACTATED RINGERS IV SOLN: INTRAVENOUS | Status: DC

## 2021-08-21 MED ORDER — ONDANSETRON HCL 4 MG/2ML IJ SOLN: 4.0000 mg | Freq: Four times a day (QID) | INTRAMUSCULAR | Status: DC | PRN

## 2021-08-21 MED ORDER — ACETAMINOPHEN 325 MG PO TABS: 650.0000 mg | ORAL_TABLET | Freq: Four times a day (QID) | ORAL | Status: DC | PRN

## 2021-08-21 MED ORDER — MORPHINE SULFATE (PF) 4 MG/ML IV SOLN
4.0000 mg | Freq: Once | INTRAVENOUS | Status: AC
  Administered 2021-08-21: 4 mg via INTRAVENOUS
  Filled 2021-08-21: qty 1

## 2021-08-21 MED ORDER — SODIUM CHLORIDE 0.9 % IV SOLN
1000.0000 mL | INTRAVENOUS | Status: DC
  Administered 2021-08-21: 1000 mL via INTRAVENOUS

## 2021-08-21 NOTE — Assessment & Plan Note (Signed)
-  Uncertain etiology -He does have advanced R hip joint changes on x-ray so possibly related to this -Will order Robaxin for spasms -Consider back imaging if he is having ongoing issues, but lumbar radiculopathy is not usually bilateral like his reported pain

## 2021-08-21 NOTE — ED Notes (Signed)
Pt returned from CT °

## 2021-08-21 NOTE — Plan of Care (Signed)

## 2021-08-21 NOTE — ED Notes (Signed)
Patient got out of bed and ripped all monitoring devices off and pulled IV out. Patient reported he needed to go to bathroom but never called out or used call bell.

## 2021-08-21 NOTE — ED Provider Notes (Signed)
[facility] EMERGENCY DEPARTMENT Provider Note   CSN: [id number] Arrival date & time: 08/21/21  2458     History  Chief Complaint  Patient presents with   Chest Pain    Chad Santos is a 61 y.o. male.   Chest Pain Patient has a history of hypertension, hiccups and insomnia. Patient presents to the ED with several complaints.  Patient states he has been having hiccups over the last few days.  He is not having any abdominal pain but he has had some intermittent chest pain maybe a couple of times lasting 30 minutes at a time.  He is not having any diaphoresis.  Although he is having some nausea.  Pain is not radiating.  Its not pressure-like.  He is not having any shortness of breath.  Patient also states that he is having pain in his thigh.  It is an aching discomfort.  He has not noticed any redness or swelling.  He has not had any falls.  He denies any back pain.  Patient also states he has not sleeping well.  He has not slept well for the last couple of days.  He has had issues like this in the past.  He denies any alcohol use.  He does smoke cigarettes.  Home Medications Prior to Admission medications   Medication Sig Start Date End Date Taking? Authorizing Provider  ibuprofen (ADVIL) 600 MG tablet Take 1 tablet (600 mg total) by mouth every 8 (eight) hours as needed. 07/11/21   Chase Picket, MD  losartan (COZAAR) 50 MG tablet Take 1 tablet (50 mg total) by mouth daily. 06/09/21   Jaynee Eagles, PA-C  methocarbamol (ROBAXIN) 500 MG tablet Take 1 tablet (500 mg total) by mouth at bedtime as needed for muscle spasms. 07/11/21   Lamptey, Myrene Galas, MD  Vitamin D, Ergocalciferol, (DRISDOL) 1.25 MG (50000 UNIT) CAPS capsule Take 1 capsule (50,000 Units total) by mouth every 7 (seven) days. 06/22/21   LampteyMyrene Galas, MD      Allergies    Patient has no known allergies.    Review of Systems   Review of Systems  Cardiovascular:  Positive for chest pain.    Physical Exam Updated Vital Signs BP 134/79    Pulse 68    Temp 98 F (36.7 C) (Oral)    Resp 14    SpO2 91%  Physical Exam Vitals and nursing note reviewed.  Constitutional:      General: He is not in acute distress.    Appearance: He is well-developed.  HENT:     Head: Normocephalic and atraumatic.     Right Ear: External ear normal.     Left Ear: External ear normal.  Eyes:     General: No scleral icterus.       Right eye: No discharge.        Left eye: No discharge.     Conjunctiva/sclera: Conjunctivae normal.  Neck:     Trachea: No tracheal deviation.  Cardiovascular:     Rate and Rhythm: Normal rate and regular rhythm.  Pulmonary:     Effort: Pulmonary effort is normal. No respiratory distress.     Breath sounds: Normal breath sounds. No stridor. No wheezing or rales.  Abdominal:     General: Bowel sounds are normal. There is no distension.     Palpations: Abdomen is soft.     Tenderness: There is no abdominal tenderness. There is no guarding or rebound.  Musculoskeletal:  General: No tenderness or deformity. Normal range of motion.     Cervical back: Neck supple.     Right lower leg: No edema.     Left lower leg: No edema.     Comments: Mild tenderness palpation right anterior thigh, compartment is soft, no mass, no edema or erythema  Skin:    General: Skin is warm and dry.     Findings: No rash.  Neurological:     General: No focal deficit present.     Mental Status: He is alert.     Cranial Nerves: No cranial nerve deficit (no facial droop, extraocular movements intact, no slurred speech).     Sensory: No sensory deficit.     Motor: No abnormal muscle tone or seizure activity.     Coordination: Coordination normal.  Psychiatric:        Mood and Affect: Mood normal.    ED Results / Procedures / Treatments   Labs (all labs ordered are listed, but only abnormal results are displayed) Labs Reviewed  BASIC METABOLIC PANEL - Abnormal; Notable for the  following components:      Result Value   Glucose, Bld 109 (*)    BUN 65 (*)    Creatinine, Ser 1.91 (*)    Calcium 8.7 (*)    GFR, Estimated 39 (*)    All other components within normal limits  CBC - Abnormal; Notable for the following components:   RBC 3.18 (*)    Hemoglobin 9.9 (*)    HCT 30.0 (*)    All other components within normal limits  HEPATIC FUNCTION PANEL - Abnormal; Notable for the following components:   Total Protein 5.5 (*)    Albumin 3.3 (*)    All other components within normal limits  POC OCCULT BLOOD, ED - Abnormal; Notable for the following components:   Occult Blood, Stool #1 POSITIVE (*)    All other components within normal limits  TROPONIN I (HIGH SENSITIVITY) - Abnormal; Notable for the following components:   Troponin I (High Sensitivity) 38 (*)    All other components within normal limits  TROPONIN I (HIGH SENSITIVITY) - Abnormal; Notable for the following components:   Troponin I (High Sensitivity) 31 (*)    All other components within normal limits  RESP PANEL BY RT-PCR (FLU A&B, COVID) ARPGX2  LIPASE, BLOOD  TYPE AND SCREEN  ABO/RH    EKG EKG Interpretation  Date/Time:  Sunday August 21 2021 07:26:10 EST Ventricular Rate:  72 PR Interval:  138 QRS Duration: 115 QT Interval:  406 QTC Calculation: 445 R Axis:   86 Text Interpretation: Sinus rhythm Atrial premature complexes Biatrial enlargement Incomplete right bundle branch block Consider left ventricular hypertrophy Repol abnrm suggests ischemia, diffuse leads Confirmed by Dorie Rank [phone]) on 08/21/2021 7:34:14 AM  Radiology DG Chest 2 View  Result Date: 08/21/2021 CLINICAL DATA:  Chest pain and right lower extremity pain. EXAM: CHEST - 2 VIEW COMPARISON:  01/20/2021 FINDINGS: Normal heart, mediastinum and hila. Clear lungs.  No pleural effusion or pneumothorax. Skeletal structures are intact. IMPRESSION: No active cardiopulmonary disease. Electronically Signed   By: Lajean Manes M.D.    On: 08/21/2021 08:22   DG Femur Min 2 Views Right  Result Date: 08/21/2021 CLINICAL DATA:  Right thigh region pain. EXAM: RIGHT FEMUR 2 VIEWS COMPARISON:  Pelvis and right hip radiographs dated 07/10/2020. FINDINGS: No fracture or bone lesion. There is right hip joint space narrowing, most evident superiorly, associated with a collar  of osteophytes along the base of the femoral head. Joint space narrowing has increased compared to the prior exam. Knee joint is not well assessed but is normally aligned with no gross degenerative/arthropathic changes and no joint effusion. There scattered femoral artery atherosclerotic calcifications. IMPRESSION: 1. No fracture, bone lesion or acute finding. 2. Advanced right hip joint arthropathic/degenerative changes. Electronically Signed   By: Lajean Manes M.D.   On: 08/21/2021 08:24    Procedures Procedures    Medications Ordered in ED Medications  sodium chloride 0.9 % bolus 1,000 mL (0 mLs Intravenous Stopped 08/21/21 1030)    Followed by  0.9 %  sodium chloride infusion (1,000 mLs Intravenous New Bag/Given 08/21/21 0805)  pantoprazole (PROTONIX) injection 40 mg (40 mg Intravenous Given 08/21/21 0806)  prochlorperazine (COMPAZINE) injection 10 mg (10 mg Intravenous Given 08/21/21 0806)  morphine 4 MG/ML injection 4 mg (4 mg Intravenous Given 08/21/21 8757)    ED Course/ Medical Decision Making/ A&P Clinical Course as of 08/21/21 1213  Sun Aug 21, 2021  0748 Previous records reviewed.  Patient has been evaluated several times in the past after urgent cares and the ED for issues with insomnia and hiccups [JK]  1112 Troponins are elevated at 38 and 31 [JK]  1112 Hemoglobin decreased at 9.9, down from 16 few months ago [JK]  1112 Covid and flu are negative [JK]  1112 Femur x-ray without acute findings [JK]  1112 Chest x-ray images and radiology report reviewed.  No acute findings [JK]  1140 Discussed with Dr Lorin Mercy regarding admission [JK]  1211 Case  discussed with Alonza Bogus, GI, will see pt. [JK]    Clinical Course User Index [JK] Dorie Rank, MD                           Medical Decision Making Amount and/or Complexity of Data Reviewed Labs: ordered. Radiology: ordered.  Risk Prescription drug management. Decision regarding hospitalization.   Chest pain, elevated troponin. Patient symptoms are atypical for ACS.  He has had fleeting intermittent chest pain.  EKG however is not normal and his troponins were slightly elevated.  Pt does have elevated creatinine, un changed.  They are only mildly elevated and the delta troponin is unchanged.  I doubt that this is related to acute coronary syndrome.  LCould consider cardiac consultation although primary issue at this time seems to be GI bleeding  Hiccups, abdominal pain Patient denies any hematemesis or blood in his stool.  He has been having upper abdominal discomfort.  And hiccups patient has a significant drop in his hemoglobin.  His stool is guaiac positive.  Suggestive of an acute GI bleed.  Patient will require hospitalization regarding this.  I will consult the medical service.  Also consult GI  Leg pain Acute abnormalities noted on x-rays.  Findings not suggestive of DVT.          Final Clinical Impression(s) / ED Diagnoses Final diagnoses:  Chest pain, unspecified type  Hiccup  Pain of right thigh  Upper GI bleed  Elevated creatine kinase     Dorie Rank, MD 08/21/21 1213

## 2021-08-21 NOTE — ED Notes (Signed)
Patient removed his second IV, bleeding controlled, dressing placed. Pt currently has a gcs of 14 due to his confusion. Pt moved to a room that is located in front of this RN. Bed alarm placed, bed in lowest position, brakes locked.

## 2021-08-21 NOTE — Assessment & Plan Note (Signed)
-  Patient with agitation and lack of understanding of the plan while in the ER despite multiple explanations -He has called his sister multiple times to ask her to bring him food even though he has been told that he can't eat -He has also exhibited erratic behaviors like pulling off his lines/monitors and sitting naked at the bedside -He provided inconsistent history like reporting that he lives in [place] or saw a doctor there recently when this does not appear to be true -He will not answer the door when his sister comes over and she has noticed erratic behavior -He is having escalating ER and urgent care visits and reports significant insomnia -All of this behavior is suggestive of underlying mental illness, although he has no report of such -Will check UDS -Psychiatry consult requested -Currently does not appear to be a danger to himself or others, but he may need a sitter

## 2021-08-21 NOTE — ED Triage Notes (Addendum)
Pt reports pain to center of chest x 3 days, hiccups, and generalized pain all over.  Denies SOB, nausea, and vomiting.

## 2021-08-21 NOTE — Assessment & Plan Note (Signed)
-  Patient is presenting with melena and anemia, suggestive of upper GI bleeding. -He reports about 1 month of dark stools and 2 weeks of DOE. -Inconsistent reporting about ibuprofen use, maybe taking BID. -Most likely diagnosis is gastric or duodenal ulcer, esophagitis or gastritis, or Mallory-Weiss tear. -The patient is not tachycardic with normal blood pressure, suggesting subacute volume loss.  -Will admit to med surg -GI consulted by ED, will follow up recommendations -NPO for possible EGD -LR at 100 mL/hr -Start IV pantoprazole 40 BID for patients with ongoing bleeding who need urgent EGD -Zofran IV for nausea -Avoid NSAIDs and SQ heparin -Maintain IV access (2 large bore IVs if possible).  ABLA -Patient's lightheadedness and DOE are most likely caused by anemia secondary to upper GI bleeding.  -His Hgb decreased from 16.3 on 11/29 to 9.9 today.  -Type and screen were done in ED.  -Monitor closely and follow cbc q12h, transfuse as necessary for Hbg <7

## 2021-08-21 NOTE — Assessment & Plan Note (Signed)
-  Recently diagnosed at Urgent Care -He was started on losartan but is not taking at this time -Will add prn IV hydralazine -May need oral therapy but will follow for now

## 2021-08-21 NOTE — ED Notes (Signed)
Pt transported to CT ?

## 2021-08-21 NOTE — ED Notes (Signed)
Advised patient to not eat until MD ok's patient to eat reports his sister is bringing him a sausage biscuit

## 2021-08-21 NOTE — Assessment & Plan Note (Signed)
-  Likely associated with demand ischemia -Minimally elevated troponin, negative delta -No further intervention planned for now

## 2021-08-21 NOTE — Consult Note (Signed)
Referring Provider: Dr. Tomi Bamberger Primary Care Physician:  Pcp, No Primary Gastroenterologist:  Althia Forts  Reason for Consultation:  GI bleed  HPI: Chad Santos is a 61 y.o. male with past medical history significant for hypertension who presented to [facility] primarily with complaints of chest pain and dyspnea on exertion for the past couple of weeks.  Hemoglobin was found to be 9.9 g, down from 16.3 g just 2 months ago.  Was heme positive.    When I entered the room patient was lying in the bed with his pants around his thighs.  After I exited the room the nurse pulled me to the side and said that he has had very unusual behavior:  keeps getting up and pulling all his lines, cords, etc.  She says that every time she goes into the room he has his pants pulled down.  She says that family says that he moved here 2 years ago from [place] and moved into his mother's house when she passed away.  His sister reports that she does not know any of his medical history and she will try to call him, but he never answers.  Apparently while here though he has been calling her continuously asking her to bring him food.  It looks like a psychiatric consult has been placed.  The patient tells me that he has been having dark stools about once a day for the past couple weeks.  Does not notice any red blood.  Denies nausea, vomiting, abdominal pain.  He tells me that he does not use ibuprofen regularly, but apparently reported at some point that he takes it twice a day.  Inconsistent with history and not forthcoming.  Apparently has never had a colonoscopy or endoscopy.   Past Medical History:  Diagnosis Date   Hypertension     Past Surgical History:  Procedure Laterality Date   HAND SURGERY Right     Prior to Admission medications   Medication Sig Start Date End Date Taking? Authorizing Provider  losartan (COZAAR) 50 MG tablet Take 1 tablet (50 mg total) by mouth daily. Patient not taking:  Reported on 08/21/2021 06/09/21   Jaynee Eagles, PA-C  methocarbamol (ROBAXIN) 500 MG tablet Take 1 tablet (500 mg total) by mouth at bedtime as needed for muscle spasms. Patient not taking: Reported on 08/21/2021 07/11/21   Chase Picket, MD    Current Facility-Administered Medications  Medication Dose Route Frequency Provider Last Rate Last Admin   acetaminophen (TYLENOL) tablet 650 mg  650 mg Oral Q6H PRN Karmen Bongo, MD       Or   acetaminophen (TYLENOL) suppository 650 mg  650 mg Rectal Q6H PRN Karmen Bongo, MD       hydrALAZINE (APRESOLINE) injection 5 mg  5 mg Intravenous Q4H PRN Karmen Bongo, MD       lactated ringers infusion   Intravenous Continuous Karmen Bongo, MD       methocarbamol (ROBAXIN) tablet 500 mg  500 mg Oral Q6H PRN Karmen Bongo, MD       morphine 2 MG/ML injection 2 mg  2 mg Intravenous Q2H PRN Karmen Bongo, MD       ondansetron [facility]) tablet 4 mg  4 mg Oral Q6H PRN Karmen Bongo, MD       Or   ondansetron [facility]) injection 4 mg  4 mg Intravenous Q6H PRN Karmen Bongo, MD       Current Outpatient Medications  Medication Sig Dispense Refill  losartan (COZAAR) 50 MG tablet Take 1 tablet (50 mg total) by mouth daily. (Patient not taking: Reported on 08/21/2021) 90 tablet 0   methocarbamol (ROBAXIN) 500 MG tablet Take 1 tablet (500 mg total) by mouth at bedtime as needed for muscle spasms. (Patient not taking: Reported on 08/21/2021) 10 tablet 0    Allergies as of 08/21/2021   (No Known Allergies)    Family History  Family history unknown: Yes    Social History   Socioeconomic History   Marital status: Single    Spouse name: Not on file   Number of children: Not on file   Years of education: Not on file   Highest education level: Not on file  Occupational History   Occupation: unemployed  Tobacco Use   Smoking status: Every Day    Packs/day: 1.00    Years: 37.00    Pack years: 37.00    Types: Cigarettes   Smokeless tobacco:  Never   Tobacco comments:    Reports <1ppd  Vaping Use   Vaping Use: Never used  Substance and Sexual Activity   Alcohol use: Yes    Comment: occasionally,6 pack/month   Drug use: Never   Sexual activity: Not on file  Other Topics Concern   Not on file  Social History Narrative   Not on file   Social Determinants of Health   Financial Resource Strain: Not on file  Food Insecurity: Not on file  Transportation Needs: Not on file  Physical Activity: Not on file  Stress: Not on file  Social Connections: Not on file  Intimate Partner Violence: Not on file    Review of Systems: ROS is O/W negative except as mentioned in HPI.  Physical Exam: Vital signs in last 24 hours: Temp:  [98 F (36.7 C)] 98 F (36.7 C) (01/29 0727) Pulse Rate:  [65-94] 68 (01/29 1200) Resp:  [12-30] 14 (01/29 1200) BP: (133-163)/(67-95) 134/79 (01/29 1200) SpO2:  [70 %-100 %] 91 % (01/29 1200)   General:  Alert, disheveled, cooperative in NAD Head:  Normocephalic and atraumatic. Eyes:  Sclera clear, no icterus.  Conjunctiva pink. Ears:  Normal auditory acuity. Mouth:  No deformity or lesions.   Lungs:  Clear throughout to auscultation.  No wheezes, crackles, or rhonchi.  Heart:  Regular rate and rhythm; no murmurs, clicks, rubs, or gallops. Abdomen:  Soft, non-distended. BS present.  Non-tender. Rectal:  Deferred.  Heme positive.  Msk:  Symmetrical without gross deformities. Pulses:  Normal pulses noted. Extremities:  Without clubbing or edema. Neurologic:  Alert and oriented x 4;  grossly normal neurologically. Skin:  Intact without significant lesions or rashes.  Intake/Output this shift: Total I/O In: 1587.5 [I.V.:587.5; IV Piggyback:1000] Out: -   Lab Results: Recent Labs    08/21/21 0727  WBC 7.6  HGB 9.9*  HCT 30.0*  PLT 187   BMET Recent Labs    08/21/21 0727  NA 137  K 3.5  CL 100  CO2 26  GLUCOSE 109*  BUN 65*  CREATININE 1.91*  CALCIUM 8.7*   LFT Recent Labs     08/21/21 0927  PROT 5.5*  ALBUMIN 3.3*  AST 29  ALT 26  ALKPHOS 46  BILITOT 0.7  BILIDIR 0.2  IBILI 0.5   Studies/Results: DG Chest 2 View  Result Date: 08/21/2021 CLINICAL DATA:  Chest pain and right lower extremity pain. EXAM: CHEST - 2 VIEW COMPARISON:  01/20/2021 FINDINGS: Normal heart, mediastinum and hila. Clear lungs.  No pleural effusion  or pneumothorax. Skeletal structures are intact. IMPRESSION: No active cardiopulmonary disease. Electronically Signed   By: Lajean Manes M.D.   On: 08/21/2021 08:22   DG Femur Min 2 Views Right  Result Date: 08/21/2021 CLINICAL DATA:  Right thigh region pain. EXAM: RIGHT FEMUR 2 VIEWS COMPARISON:  Pelvis and right hip radiographs dated 07/10/2020. FINDINGS: No fracture or bone lesion. There is right hip joint space narrowing, most evident superiorly, associated with a collar of osteophytes along the base of the femoral head. Joint space narrowing has increased compared to the prior exam. Knee joint is not well assessed but is normally aligned with no gross degenerative/arthropathic changes and no joint effusion. There scattered femoral artery atherosclerotic calcifications. IMPRESSION: 1. No fracture, bone lesion or acute finding. 2. Advanced right hip joint arthropathic/degenerative changes. Electronically Signed   By: Lajean Manes M.D.   On: 08/21/2021 08:24    IMPRESSION:  *New anemia with heme positive stools:  Hgb 9.9 grams down from 16.3 grams two months ago.  He does report dark stools once daily for the past couple weeks.  Questionable NSAID use.  He reported twice daily ibuprofen use to the admitting physician, but denied that to me.  Patient is a very poor and unreliable historian.  PLAN: -EGD at some point. -Will need colonoscopy at some point as well.  ? As inpatient with EGD or wait for results of EGD first and then plan for outpatient if bleeding source found on EGD.  Don't know that he would follow up for that. -Monitor Hgb and  transfuse prn. -Pantoprazole 40 mg IV BID.  **Of note, when I entered the room patient was lying in the bed with his pants around his thighs.  After I exited the room the nurse pulled me to the side and said that he has had very unusual behavior:  keeps getting up and pulling all his lines, cords, etc.  She says that every time she goes into the room he has his pants pulled down.  She says that family says that he moved here 2 years ago from [place] and moved into his mother's house when she passed away.  His sister reports that she does not know any of his medical history and she will try to call him, but he never answers.  Apparently while here though he has been calling her continuously asking her to bring him food.  It looks like a psychiatric consult has been placed.   Chad Santos. Chad Santos  08/21/2021, 12:57 PM

## 2021-08-21 NOTE — ED Notes (Signed)
Patient acting bizarre, walked into room and patient had gotten dressed but had his pants down and was sitting on the chair bare bottom.  Patient tapping his foot as though he was anxious or upset.  When asked what was wrong patient voice pain to right leg.  Asked patient to get back in bed so we could hook him back up to monitor and reinforced he is admitted for this GI bleed.

## 2021-08-21 NOTE — H&P (Signed)
History and Physical    Patient: Chad Santos NAT:[id number] DOB: Sep 11, 1960 DOA: 08/21/2021 DOS: the patient was seen and examined on 08/21/2021 PCP: Pcp, No - last saw a doctor a few months ago in [place], reports he is just visiting here Patient coming from: Home - lives alone in [place]; [place]: [place] Pop, [place]   Chief Complaint: Chest pain  HPI: Chad Santos is a 61 y.o. male with medical history significant of HTN presenting with chest pain. He reports pain in his legs, can't sleep.  Denies chest or abdominal pain.  His BMs have gotten dark for maybe a month.  No prior h/o GI bleeds.  He has never had a colonoscopy.  +DOE for about 2 weeks.  Not light-headed or dizziness.  No N/V.  He takes ibuprofen BID.    The nurse reports that his sister knows nothing about him.  He moved from [place] into his mother's house after she died.  He doesn't answer the door when she calls.  She will try to reach out to his daughter, in [place].    His sister reports that she hasn't heard from him in months.  He called her this AM to ask her to take him to the hospital; he didn't say anything else on the way to the hospital.  She brought him here a while back but she doesn't know anything about his health.  She is not aware of mental health issues.  He moved into his mother's house a couple of years ago.  Their mother died in 06-20-18 and he has been living in her house in [place] since then.  He may be using drugs, she doesn't know.  He's been "very quiet" since he's been back.   She will call his daughter to see if she can help.  He has been seen frequently in the ER since 6/22.  He presented on 6/10 with insomnia, new onset, with frontal headaches; he had a negative head CT and was given trazodone.  He returned on 6/30 with decreased appetite and hiccups; evaluation was unremarkable and he was discharged without intervention.  He returned to urgent care on 10/24 with leg pain and  insomnia.  Trazodone was refilled and he was given Flexeril and ibuprofen.  He was back at [facility] on 11/7 with anorexia and denied mood issues other than insomnia; trazodone dose was increased and he was given melatonin.  He came to the ER for back pain on 11/11 but LWBS.  He went to UC on 11/17 for back pain with B radiculopathy and he was treated with PO prednisone and muscle relaxant and recommended for outpatient PCP f/u for consideration of MRI.  He was started on losartan for HTN.   He returned to UC on 11/29 with insomnia and anorexia and was given Lunesta.  He came to the ER on 12/7 with insomnia and anorexia and was given hydroxyzine.  He returned on 12/19 at [facility] with back pain and R-side sciatica and was given ibuprofen and robaxin.    ER Course:  Upper GI bleed.  Complained of chest pain, hiccups, not sleeping well, but not n/v.  +hiccups during exam.  Hgb dropped from 16.3 to 9.9, heme positive.  Mild troponin elevation, 38, 31.  Creatinine 1.91, increased.  Ordered Protonix.  GI consulted.     Review of Systems: As mentioned in the history of present illness. All other systems reviewed and are negative. Past Medical History:  Diagnosis Date   Hypertension  Past Surgical History:  Procedure Laterality Date   HAND SURGERY Right    Social History:  reports that he has been smoking cigarettes. He has a 37.00 pack-year smoking history. He has never used smokeless tobacco. He reports current alcohol use. He reports that he does not use drugs.  No Known Allergies  Family History  Family history unknown: Yes    Prior to Admission medications   Medication Sig Start Date End Date Taking? Authorizing Provider  ibuprofen (ADVIL) 600 MG tablet Take 1 tablet (600 mg total) by mouth every 8 (eight) hours as needed. 07/11/21   Chase Picket, MD  losartan (COZAAR) 50 MG tablet Take 1 tablet (50 mg total) by mouth daily. 06/09/21   Jaynee Eagles, PA-C  methocarbamol (ROBAXIN) 500 MG tablet  Take 1 tablet (500 mg total) by mouth at bedtime as needed for muscle spasms. 07/11/21   Lamptey, Myrene Galas, MD  Vitamin D, Ergocalciferol, (DRISDOL) 1.25 MG (50000 UNIT) CAPS capsule Take 1 capsule (50,000 Units total) by mouth every 7 (seven) days. 06/22/21   Chase Picket, MD    Physical Exam: Vitals:   08/21/21 1015 08/21/21 1045 08/21/21 1115 08/21/21 1200  BP: 133/67 (!) 149/78 133/80 134/79  Pulse: 65 70 78 68  Resp: 13 12 19 14   Temp:      TempSrc:      SpO2: 100% 99% 99% 91%   General:  Appears agitated, restless, rubbing R > L thighs Eyes:  PERRL, EOMI, normal lids, iris ENT:  grossly normal hearing, lips & tongue, mmm; poor/absent dentition Neck:  no LAD, masses or thyromegaly Cardiovascular:  RRR, no m/r/g. No LE edema.  Respiratory:   CTA bilaterally with no wheezes/rales/rhonchi.  Normal respiratory effort. Abdomen:  soft, NT, ND Skin:  no rash or induration seen on limited exam Musculoskeletal:  grossly normal tone BUE/BLE, good ROM, no bony abnormality Psychiatric:  eccentric/agitated mood and affect, speech fluent and appropriate for the most part but there is inconsistency in his answers to questions, AOx3 Neurologic:  CN 2-12 grossly intact, moves all extremities in coordinated fashion   Radiological Exams on Admission: Independently reviewed - see discussion in A/P where applicable  DG Chest 2 View  Result Date: 08/21/2021 CLINICAL DATA:  Chest pain and right lower extremity pain. EXAM: CHEST - 2 VIEW COMPARISON:  01/20/2021 FINDINGS: Normal heart, mediastinum and hila. Clear lungs.  No pleural effusion or pneumothorax. Skeletal structures are intact. IMPRESSION: No active cardiopulmonary disease. Electronically Signed   By: Lajean Manes M.D.   On: 08/21/2021 08:22   DG Femur Min 2 Views Right  Result Date: 08/21/2021 CLINICAL DATA:  Right thigh region pain. EXAM: RIGHT FEMUR 2 VIEWS COMPARISON:  Pelvis and right hip radiographs dated 07/10/2020. FINDINGS:  No fracture or bone lesion. There is right hip joint space narrowing, most evident superiorly, associated with a collar of osteophytes along the base of the femoral head. Joint space narrowing has increased compared to the prior exam. Knee joint is not well assessed but is normally aligned with no gross degenerative/arthropathic changes and no joint effusion. There scattered femoral artery atherosclerotic calcifications. IMPRESSION: 1. No fracture, bone lesion or acute finding. 2. Advanced right hip joint arthropathic/degenerative changes. Electronically Signed   By: Lajean Manes M.D.   On: 08/21/2021 08:24    EKG: Independently reviewed.  NSR with rate 72; nonspecific ST changes with no prior for comparison   Labs on Admission: I have personally reviewed the available labs and  imaging studies at the time of the admission.  Pertinent labs:    Glucose 109; 202 on 11/29 BUN 65/Creatinine 1.91/GFR 39 - stable from 06/21/21 HS troponin 38, 31 WBC 7.6 Hgb 9.9; 16.3 on 11/29 Heme positive COVID/flu negative   Assessment/Plan * Upper GI bleeding- (present on admission) -Patient is presenting with melena and anemia, suggestive of upper GI bleeding. -He reports about 1 month of dark stools and 2 weeks of DOE. -Inconsistent reporting about ibuprofen use, maybe taking BID. -Most likely diagnosis is gastric or duodenal ulcer, esophagitis or gastritis, or Mallory-Weiss tear. -The patient is not tachycardic with normal blood pressure, suggesting subacute volume loss.  -Will admit to med surg -GI consulted by ED, will follow up recommendations -NPO for possible EGD -LR at 100 mL/hr -Start IV pantoprazole 40 BID for patients with ongoing bleeding who need urgent EGD -Zofran IV for nausea -Avoid NSAIDs and SQ heparin -Maintain IV access (2 large bore IVs if possible).  ABLA -Patient's lightheadedness and DOE are most likely caused by anemia secondary to upper GI bleeding.  -His Hgb decreased from  16.3 on 11/29 to 9.9 today.  -Type and screen were done in ED.  -Monitor closely and follow cbc q12h, transfuse as necessary for Hbg <7  Agitation- (present on admission) -Patient with agitation and lack of understanding of the plan while in the ER despite multiple explanations -He has called his sister multiple times to ask her to bring him food even though he has been told that he can't eat -He has also exhibited erratic behaviors like pulling off his lines/monitors and sitting naked at the bedside -He provided inconsistent history like reporting that he lives in [place] or saw a doctor there recently when this does not appear to be true -He will not answer the door when his sister comes over and she has noticed erratic behavior -He is having escalating ER and urgent care visits and reports significant insomnia -All of this behavior is suggestive of underlying mental illness, although he has no report of such -Will check UDS -Psychiatry consult requested -Currently does not appear to be a danger to himself or others, but he may need a sitter  Bilateral leg pain- (present on admission) -Uncertain etiology -He does have advanced R hip joint changes on x-ray so possibly related to this -Will order Robaxin for spasms -Consider back imaging if he is having ongoing issues, but lumbar radiculopathy is not usually bilateral like his reported pain  Elevated troponin- (present on admission) -Likely associated with demand ischemia -Minimally elevated troponin, negative delta -No further intervention planned for now  Essential hypertension- (present on admission) -Recently diagnosed at Urgent Care -He was started on losartan but is not taking at this time -Will add prn IV hydralazine -May need oral therapy but will follow for now    Advance Care Planning:   Code Status: Full Code   Consults: GI; psychiatry  Family Communication: I spoke with his sister by telephone at the time of  admission  Severity of Illness: The appropriate patient status for this patient is INPATIENT. Inpatient status is judged to be reasonable and necessary in order to provide the required intensity of service to ensure the patient's safety. The patient's presenting symptoms, physical exam findings, and initial radiographic and laboratory data in the context of their chronic comorbidities is felt to place them at high risk for further clinical deterioration. Furthermore, it is not anticipated that the patient will be medically stable for discharge from the  hospital within 2 midnights of admission.   * I certify that at the point of admission it is my clinical judgment that the patient will require inpatient hospital care spanning beyond 2 midnights from the point of admission due to high intensity of service, high risk for further deterioration and high frequency of surveillance required.*  Author: Karmen Bongo, MD 08/21/2021 1:53 PM  For on call review [email or web address].

## 2021-08-22 LAB — IRON AND TIBC
Iron: 56 ug/dL (ref 45–182)
Saturation Ratios: 29 % (ref 17.9–39.5)
TIBC: 195 ug/dL — ABNORMAL LOW (ref 250–450)
UIBC: 139 ug/dL

## 2021-08-22 LAB — BASIC METABOLIC PANEL
Anion gap: 8 (ref 5–15)
BUN: 35 mg/dL — ABNORMAL HIGH (ref 8–23)
CO2: 26 mmol/L (ref 22–32)
Calcium: 8.5 mg/dL — ABNORMAL LOW (ref 8.9–10.3)
Chloride: 108 mmol/L (ref 98–111)
Creatinine, Ser: 1.38 mg/dL — ABNORMAL HIGH (ref 0.61–1.24)
GFR, Estimated: 58 mL/min — ABNORMAL LOW (ref >60)
Glucose, Bld: 81 mg/dL (ref 70–99)
Potassium: 3.9 mmol/L (ref 3.5–5.1)
Sodium: 142 mmol/L (ref 135–145)

## 2021-08-22 LAB — CBC
HCT: 26 % — ABNORMAL LOW (ref 39.0–52.0)
Hemoglobin: 8.7 g/dL — ABNORMAL LOW (ref 13.0–17.0)
MCH: 31.6 pg (ref 26.0–34.0)
MCHC: 33.5 g/dL (ref 30.0–36.0)
MCV: 94.5 fL (ref 80.0–100.0)
Platelets: 161 10*3/uL (ref 150–400)
RBC: 2.75 MIL/uL — ABNORMAL LOW (ref 4.22–5.81)
RDW: 15.3 % (ref 11.5–15.5)
WBC: 6.8 10*3/uL (ref 4.0–10.5)
nRBC: 0 % (ref 0.0–0.2)

## 2021-08-22 LAB — FERRITIN: Ferritin: 306 ng/mL (ref 24–336)

## 2021-08-22 LAB — RETICULOCYTES
Immature Retic Fract: 7.9 % (ref 2.3–15.9)
RBC.: 2.8 MIL/uL — ABNORMAL LOW (ref 4.22–5.81)
Retic Count, Absolute: 60.5 10*3/uL (ref 19.0–186.0)
Retic Ct Pct: 2.2 % (ref 0.4–3.1)

## 2021-08-22 MED ORDER — BISACODYL 5 MG PO TBEC
20.0000 mg | DELAYED_RELEASE_TABLET | Freq: Once | ORAL | Status: AC
  Administered 2021-08-22: 20 mg via ORAL
  Filled 2021-08-22: qty 4

## 2021-08-22 MED ORDER — PEG-KCL-NACL-NASULF-NA ASC-C 100 G PO SOLR
0.5000 | Freq: Once | ORAL | Status: AC
Start: 2021-08-22 — End: 2021-08-22
  Administered 2021-08-22: 100 g via ORAL
  Filled 2021-08-22: qty 1

## 2021-08-22 MED ORDER — TRAZODONE HCL 50 MG PO TABS
50.0000 mg | ORAL_TABLET | Freq: Every evening | ORAL | Status: DC | PRN
  Administered 2021-08-22: 50 mg via ORAL
  Filled 2021-08-22: qty 1

## 2021-08-22 MED ORDER — METOCLOPRAMIDE HCL 5 MG/ML IJ SOLN
10.0000 mg | Freq: Four times a day (QID) | INTRAMUSCULAR | Status: AC
  Administered 2021-08-22 (×2): 10 mg via INTRAVENOUS
  Filled 2021-08-22 (×2): qty 2

## 2021-08-22 MED ORDER — PEG-KCL-NACL-NASULF-NA ASC-C 100 G PO SOLR
0.5000 | Freq: Once | ORAL | Status: AC
  Administered 2021-08-22: 100 g via ORAL

## 2021-08-22 MED ORDER — PEG-KCL-NACL-NASULF-NA ASC-C 100 G PO SOLR: 1.0000 | Freq: Once | ORAL | Status: DC

## 2021-08-22 MED ORDER — SODIUM CHLORIDE 0.9 % IV SOLN: INTRAVENOUS | Status: DC

## 2021-08-22 NOTE — H&P (View-Only) (Signed)
Daily Rounding Note  08/22/2021, 12:36 PM  LOS: 1 day   SUBJECTIVE:   Chief complaint:  anemia.  FOBT +.      Overnight reported dark, tarry stool, patient did not see this and he had not seen tarry stool at home.  He is on clears.  No nausea or vomiting.  Abdominal pain present at arrival has resolved.  Leg pain is also improved.  OBJECTIVE:         Vital signs in last 24 hours:    Temp:  [98.3 F (36.8 C)-99.4 F (37.4 C)] 98.3 F (36.8 C) (01/30 0839) Pulse Rate:  [62-98] 76 (01/30 0839) Resp:  [16-22] 19 (01/30 0839) BP: (134-153)/(75-90) 136/78 (01/30 0839) SpO2:  [98 %-100 %] 98 % (01/30 0839) Last BM Date: 08/21/21 There were no vitals filed for this visit. General: Comfortable, pleasant, somewhat chronically ill-appearing but in no distress and calm. Heart: RRR Chest: Clear bilaterally.  No labored breathing or cough. Abdomen: Soft without tenderness.  Active bowel sounds.  No distention. Extremities: No CCE. Neuro/Psych: Oriented x3.  Appropriate.  No pressured speech.  Pleasant.  Intake/Output from previous day: 01/29 0701 - 01/30 0700 In: 3218.4 [I.V.:2218.4; IV Piggyback:1000] Out: -   Intake/Output this shift: No intake/output data recorded.  Lab Results: Recent Labs    08/21/21 0727 08/21/21 1652 08/22/21 0618  WBC 7.6 8.6 6.8  HGB 9.9* 8.9* 8.7*  HCT 30.0* 27.6* 26.0*  PLT 187 165 161   BMET Recent Labs    08/21/21 0727 08/22/21 0618  NA 137 142  K 3.5 3.9  CL 100 108  CO2 26 26  GLUCOSE 109* 81  BUN 65* 35*  CREATININE 1.91* 1.38*  CALCIUM 8.7* 8.5*   LFT Recent Labs    08/21/21 0927  PROT 5.5*  ALBUMIN 3.3*  AST 29  ALT 26  ALKPHOS 46  BILITOT 0.7  BILIDIR 0.2  IBILI 0.5   PT/INR No results for input(s): LABPROT, INR in the last 72 hours. Hepatitis Panel No results for input(s): HEPBSAG, HCVAB, HEPAIGM, HEPBIGM in the last 72 hours.  Studies/Results: DG Chest  2 View  Result Date: 08/21/2021 CLINICAL DATA:  Chest pain and right lower extremity pain. EXAM: CHEST - 2 VIEW COMPARISON:  01/20/2021 FINDINGS: Normal heart, mediastinum and hila. Clear lungs.  No pleural effusion or pneumothorax. Skeletal structures are intact. IMPRESSION: No active cardiopulmonary disease. Electronically Signed   By: Lajean Manes M.D.   On: 08/21/2021 08:22   CT HEAD WO CONTRAST (5MM)  Result Date: 08/21/2021 CLINICAL DATA:  Delirium. EXAM: CT HEAD WITHOUT CONTRAST TECHNIQUE: Contiguous axial images were obtained from the base of the skull through the vertex without intravenous contrast. RADIATION DOSE REDUCTION: This exam was performed according to the departmental dose-optimization program which includes automated exposure control, adjustment of the mA and/or kV according to patient size and/or use of iterative reconstruction technique. COMPARISON:  December 31, 2020. FINDINGS: Brain: Mild chronic ischemic white matter disease is noted. No mass effect or midline shift is noted. Ventricular size is within normal limits. There is no evidence of mass lesion, hemorrhage or acute infarction. Vascular: No hyperdense vessel or unexpected calcification. Skull: Normal. Negative for fracture or focal lesion. Sinuses/Orbits: No acute finding. Other: None. IMPRESSION: No acute intracranial abnormality seen. Electronically Signed   By: Marijo Conception M.D.   On: 08/21/2021 17:09   DG Femur Min 2 Views Right  Result Date: 08/21/2021 CLINICAL  DATA:  Right thigh region pain. EXAM: RIGHT FEMUR 2 VIEWS COMPARISON:  Pelvis and right hip radiographs dated 07/10/2020. FINDINGS: No fracture or bone lesion. There is right hip joint space narrowing, most evident superiorly, associated with a collar of osteophytes along the base of the femoral head. Joint space narrowing has increased compared to the prior exam. Knee joint is not well assessed but is normally aligned with no gross degenerative/arthropathic  changes and no joint effusion. There scattered femoral artery atherosclerotic calcifications. IMPRESSION: 1. No fracture, bone lesion or acute finding. 2. Advanced right hip joint arthropathic/degenerative changes. Electronically Signed   By: Lajean Manes M.D.   On: 08/21/2021 08:24    Scheduled Meds:  pantoprazole (PROTONIX) IV  40 mg Intravenous Q12H   Continuous Infusions:  lactated ringers 100 mL/hr at 08/22/21 0154   PRN Meds:.acetaminophen **OR** acetaminophen, hydrALAZINE, methocarbamol, morphine injection, ondansetron **OR** ondansetron (ZOFRAN) IV   ASSESMENT:   Normocytic anemia.  Suspect multifactorial.  Pt reports dark stools in the past few weeks.  May have been using ibuprofen on a regular basis.  Empiric IV bid pantoprazole in place.  Iron, iron sats, ferritin, folate, B12 levels normal.  Low TIBC.  Hgb  ~16 in 05/2021, now 9.9 ..  8.7.  FOBT positive.  No GI imaging thus far.  Delirium, confusion.  Head CT w mild, chronic ischemic WM dz.  No acute changes.  Psychiatry involved, incomplete notes thus far, have yet to offer diagnosis.  No obvious mental health issues today.      AKI, improved.     PLAN   Notes mention psychiatry consult is pending to assess for patient competency to make medical decisions.    Colonoscopy, EGD.  On for 0930 tmrw.  Procedure details including complications, risks, benefits discussed with patient and he is agreeable.  He seems entirely competent to make medical decisions based on interaction today.      Azucena Freed  08/22/2021, 12:36 PM Phone [phone]   I have taken a history, reviewed the chart and examined the patient. I performed a substantive portion of this encounter, including complete performance of at least one of the key components, in conjunction with the APP. I agree with the APP's note, impression and recommendations.   61 year old male w/hx of HTN p/w anemia with heme + stools. Psych says patient is A&Ox3 and likely had  an episode of delirium. Plan for EGD/colonoscopy tomorrow.

## 2021-08-22 NOTE — Consult Note (Shared)
Pt states "I am doing a lot better than I was doing yesterday". States he came in for his stomach and his leg, but he's not sure of any test results. He does remember hearing that his blood was low. Overall poor healthcare literacy (did not recognize terms anemia, hemoglobin). Generally denies depression, anxiety. Has poor sleep. He does not remember the names of any of the medications he has gotten for sleep recently. He denies pain. He denies racing thoughts or stressors. He denies any relationship issues. He states that he "doesn't do anything except collect disability". Collects disability for his leg. Has siblings who live in [place], but not in touch with them. Denies isolating himself. No thoughts of hurting self or others. Denies hallucinations. Denies paranoia. Feels appetite is poor. Denies any history of abuse. Drinks maybe 1 beer/week. Remote marijuana use. Smokes cigarettes - ~1 ppd.  Oriented to self, situation, date month, year, president, and president - incorrectly gave past president as Mady Gemma.   Has never been admitted to a psych hospital. No hx self-harm. No family hx mental illness.   Psychosocial Lives alone, desires roommate.

## 2021-08-22 NOTE — Progress Notes (Addendum)
Daily Rounding Note  08/22/2021, 12:36 PM  LOS: 1 day   SUBJECTIVE:   Chief complaint:  anemia.  FOBT +.      Overnight reported dark, tarry stool, patient did not see this and he had not seen tarry stool at home.  He is on clears.  No nausea or vomiting.  Abdominal pain present at arrival has resolved.  Leg pain is also improved.  OBJECTIVE:         Vital signs in last 24 hours:    Temp:  [98.3 F (36.8 C)-99.4 F (37.4 C)] 98.3 F (36.8 C) (01/30 0839) Pulse Rate:  [62-98] 76 (01/30 0839) Resp:  [16-22] 19 (01/30 0839) BP: (134-153)/(75-90) 136/78 (01/30 0839) SpO2:  [98 %-100 %] 98 % (01/30 0839) Last BM Date: 08/21/21 There were no vitals filed for this visit. General: Comfortable, pleasant, somewhat chronically ill-appearing but in no distress and calm. Heart: RRR Chest: Clear bilaterally.  No labored breathing or cough. Abdomen: Soft without tenderness.  Active bowel sounds.  No distention. Extremities: No CCE. Neuro/Psych: Oriented x3.  Appropriate.  No pressured speech.  Pleasant.  Intake/Output from previous day: 01/29 0701 - 01/30 0700 In: 3218.4 [I.V.:2218.4; IV Piggyback:1000] Out: -   Intake/Output this shift: No intake/output data recorded.  Lab Results: Recent Labs    08/21/21 0727 08/21/21 1652 08/22/21 0618  WBC 7.6 8.6 6.8  HGB 9.9* 8.9* 8.7*  HCT 30.0* 27.6* 26.0*  PLT 187 165 161   BMET Recent Labs    08/21/21 0727 08/22/21 0618  NA 137 142  K 3.5 3.9  CL 100 108  CO2 26 26  GLUCOSE 109* 81  BUN 65* 35*  CREATININE 1.91* 1.38*  CALCIUM 8.7* 8.5*   LFT Recent Labs    08/21/21 0927  PROT 5.5*  ALBUMIN 3.3*  AST 29  ALT 26  ALKPHOS 46  BILITOT 0.7  BILIDIR 0.2  IBILI 0.5   PT/INR No results for input(s): LABPROT, INR in the last 72 hours. Hepatitis Panel No results for input(s): HEPBSAG, HCVAB, HEPAIGM, HEPBIGM in the last 72 hours.  Studies/Results: DG Chest  2 View  Result Date: 08/21/2021 CLINICAL DATA:  Chest pain and right lower extremity pain. EXAM: CHEST - 2 VIEW COMPARISON:  01/20/2021 FINDINGS: Normal heart, mediastinum and hila. Clear lungs.  No pleural effusion or pneumothorax. Skeletal structures are intact. IMPRESSION: No active cardiopulmonary disease. Electronically Signed   By: Lajean Manes M.D.   On: 08/21/2021 08:22   CT HEAD WO CONTRAST (5MM)  Result Date: 08/21/2021 CLINICAL DATA:  Delirium. EXAM: CT HEAD WITHOUT CONTRAST TECHNIQUE: Contiguous axial images were obtained from the base of the skull through the vertex without intravenous contrast. RADIATION DOSE REDUCTION: This exam was performed according to the departmental dose-optimization program which includes automated exposure control, adjustment of the mA and/or kV according to patient size and/or use of iterative reconstruction technique. COMPARISON:  December 31, 2020. FINDINGS: Brain: Mild chronic ischemic white matter disease is noted. No mass effect or midline shift is noted. Ventricular size is within normal limits. There is no evidence of mass lesion, hemorrhage or acute infarction. Vascular: No hyperdense vessel or unexpected calcification. Skull: Normal. Negative for fracture or focal lesion. Sinuses/Orbits: No acute finding. Other: None. IMPRESSION: No acute intracranial abnormality seen. Electronically Signed   By: Marijo Conception M.D.   On: 08/21/2021 17:09   DG Femur Min 2 Views Right  Result Date: 08/21/2021 CLINICAL  DATA:  Right thigh region pain. EXAM: RIGHT FEMUR 2 VIEWS COMPARISON:  Pelvis and right hip radiographs dated 07/10/2020. FINDINGS: No fracture or bone lesion. There is right hip joint space narrowing, most evident superiorly, associated with a collar of osteophytes along the base of the femoral head. Joint space narrowing has increased compared to the prior exam. Knee joint is not well assessed but is normally aligned with no gross degenerative/arthropathic  changes and no joint effusion. There scattered femoral artery atherosclerotic calcifications. IMPRESSION: 1. No fracture, bone lesion or acute finding. 2. Advanced right hip joint arthropathic/degenerative changes. Electronically Signed   By: Lajean Manes M.D.   On: 08/21/2021 08:24    Scheduled Meds:  pantoprazole (PROTONIX) IV  40 mg Intravenous Q12H   Continuous Infusions:  lactated ringers 100 mL/hr at 08/22/21 0154   PRN Meds:.acetaminophen **OR** acetaminophen, hydrALAZINE, methocarbamol, morphine injection, ondansetron **OR** ondansetron (ZOFRAN) IV   ASSESMENT:   Normocytic anemia.  Suspect multifactorial.  Pt reports dark stools in the past few weeks.  May have been using ibuprofen on a regular basis.  Empiric IV bid pantoprazole in place.  Iron, iron sats, ferritin, folate, B12 levels normal.  Low TIBC.  Hgb  ~16 in 05/2021, now 9.9 ..  8.7.  FOBT positive.  No GI imaging thus far.  Delirium, confusion.  Head CT w mild, chronic ischemic WM dz.  No acute changes.  Psychiatry involved, incomplete notes thus far, have yet to offer diagnosis.  No obvious mental health issues today.      AKI, improved.     PLAN   Notes mention psychiatry consult is pending to assess for patient competency to make medical decisions.    Colonoscopy, EGD.  On for 0930 tmrw.  Procedure details including complications, risks, benefits discussed with patient and he is agreeable.  He seems entirely competent to make medical decisions based on interaction today.      Azucena Freed  08/22/2021, 12:36 PM Phone [phone]   I have taken a history, reviewed the chart and examined the patient. I performed a substantive portion of this encounter, including complete performance of at least one of the key components, in conjunction with the APP. I agree with the APP's note, impression and recommendations.   61 year old male w/hx of HTN p/w anemia with heme + stools. Psych says patient is A&Ox3 and likely had  an episode of delirium. Plan for EGD/colonoscopy tomorrow.

## 2021-08-22 NOTE — Progress Notes (Signed)
PROGRESS NOTE  Chad Santos  DOB: 03-17-1961  PCP: Pcp, No OIT:[id number]  DOA: 08/21/2021  LOS: 1 day  Hospital Day: 2  Chief Complaint  Patient presents with   Chest Pain    Brief narrative: Chad Santos is a 61 y.o. male with PMH significant for hypertension  Patient presented to the ED on 08/12/2021 with complaint of chest pain, dyspnea and exertion for last few weeks.  Also reported black stool for same duration Patient lives at home alone.  He had sister reported that she had not heard from him in months.  He called her on 1/29 morning to take him to the hospital. In the ED, patient was hemodynamically stable. Labs with hemoglobin 9.9, BUN/creatinine 65/1.91 FOBT positive CT head unremarkable While in the ED, patient was also showing unusual behavior. Admitted to hospital service GI and psychiatry were consulted.  Subjective: Patient was seen and examined this morning.  Pleasant middle-aged African-American male.  His biggest concern at this time is being on clear liquid diet and not getting real food. Is oriented x3 on my evaluation.  Seen by psychiatry as well.  Assessment/Plan: Acute GI bleeding -Presented with melena, fatigue, dyspnea on exertion -Noted to be FOBT positive and had low hemoglobin level at 9.9 compared to 16 few weeks ago. -GI consulted.  Currently on Protonix IV 40 mg twice daily, clear liquid diet -Intervention per GI  Acute blood loss anemia -Acute drop in hemoglobin due to GI bleeding, seems fairly recent.  Ferritin level remains normal at 306.  Continue to monitor hemoglobin.  Transfuse if less than 7. Recent Labs    06/03/21 1300 06/21/21 1430 06/21/21 1433 08/21/21 0727 08/21/21 1652 08/22/21 0618  HGB 15.2 16.3  --  9.9* 8.9* 8.7*  MCV 95.3 93.9  --  94.3 95.8 94.5  VITAMINB12  --  262  --   --   --   --   FOLATE  --   --  13.6  --   --   --   FERRITIN  --   --   --   --   --  306  TIBC  --   --   --   --   --  195*  IRON  --    --   --   --   --  56  RETICCTPCT  --   --   --   --   --  2.2   Agitation -Noted to be agitated in the ED.  Lack understanding of the plan despite multiple explanations. -Psychiatry consultation was obtained.  Per psychiatry note, patient denies any depression, anxiety or psychosis.  He is alert, awake and oriented. Rec PRN IV Haldol for agitation.  Bilateral leg pain -Has advanced right hip joint changes and x-ray.  Currently on as needed Robaxin.  Elevated troponin -Mild elevation in troponin likely associated with demand ischemia Recent Labs    08/21/21 0727 08/21/21 0927  TROPONINIHS 38* 31*   Essential hypertension -IV hydralazine as needed  Mobility: Encourage ambulation.  PT eval ordered. Living condition: Lives at home Goals of care:   Code Status: Full Code  Nutritional status: There is no height or weight on file to calculate BMI.      Diet:  Diet Order             Diet clear liquid Room service appropriate? Yes; Fluid consistency: Thin  Diet effective now  DVT prophylaxis:  SCDs Start: 08/21/21 1242   Antimicrobials: None Fluid: LR@100  Consultants: GI, psychiatry Family Communication: None at bedside  Status is: Inpatient  Continue in-hospital care because: Pending GI intervention Level of care: Med-Surg   Dispo: The patient is from: Home              Anticipated d/c is to: Pending PT eval              Patient currently is not medically stable to d/c.   Difficult to place patient No     Infusions:   lactated ringers 100 mL/hr at 08/22/21 1248    Scheduled Meds:  pantoprazole (PROTONIX) IV  40 mg Intravenous Q12H    PRN meds: acetaminophen **OR** acetaminophen, hydrALAZINE, methocarbamol, morphine injection, ondansetron **OR** ondansetron (ZOFRAN) IV   Antimicrobials: Anti-infectives (From admission, onward)    None       Objective: Vitals:   08/21/21 2327 08/22/21 0839  BP: (!) 153/86 136/78  Pulse: 83 76   Resp: 18 19  Temp: 99.4 F (37.4 C) 98.3 F (36.8 C)  SpO2: 99% 98%    Intake/Output Summary (Last 24 hours) at 08/22/2021 1306 Last data filed at 08/22/2021 0600 Gross per 24 hour  Intake 1630.93 ml  Output --  Net 1630.93 ml   There were no vitals filed for this visit. Weight change:  There is no height or weight on file to calculate BMI.   Physical Exam: General exam: Pleasant, middle-aged African-American male.  Not in physical distress Skin: No rashes, lesions or ulcers. HEENT: Atraumatic, normocephalic, no obvious bleeding Lungs: Clear to auscultation bilaterally CVS: Regular rate and rhythm, no murmur GI/Abd soft, nontender, nondistended, bowel sound present CNS: Alert, awake, oriented x3, Psychiatry: Not restless or agitated at the time of my evaluation Extremities: No pedal edema, no calf tenderness  Data Review: I have personally reviewed the laboratory data and studies available.  F/u labs ordered Unresulted Labs (From admission, onward)     Start     Ordered   Unscheduled  CBC with Differential/Platelet  Daily,   R      08/22/21 1306   Unscheduled  Basic metabolic panel  Daily,   R      08/22/21 1306            Signed, Terrilee Croak, MD [facility] 08/22/2021

## 2021-08-22 NOTE — Plan of Care (Signed)
  Problem: Education: Goal: Knowledge of General Education information will improve Description Including pain rating scale, medication(s)/side effects and non-pharmacologic comfort measures Outcome: Progressing   

## 2021-08-22 NOTE — TOC Initial Note (Signed)
Transition of Care [facility]) - Initial/Assessment Note    Patient Details  Name: Chad Santos MRN: [id number] Date of Birth: 12/07/1960  [facility] CM/SW Contact:    Tom-Johnson, Renea Ee, RN Phone Number: 08/22/2021, 2:56 PM  Clinical Narrative:                  CM spoke with patient at bedside about needs for post hospital transition. Patient is admitted for GI bleed. States he lives alone. Has a daughter who lives in [place] and has a sister, Vito Backers whom are supportive. Patient is on disability and does not drive. States he catches ride with family and friends to and from his appointments. CM will schedule transportation at discharge if needed. Patient does not have medical insurance and also does not have a PCP. CM sent referral to [employer] for Medicaid eligibility and patient's account is assigned to [place] and are working towards an interview per [employer], Hotel manager. MATCH done for medication assistance. Followup appointment with [facility] and information on AVS. No recommendation noted at this time. CM will continue to follow with needs.   Barriers to Discharge: Continued Medical Work up   Patient Goals and CMS Choice Patient states their goals for this hospitalization and ongoing recovery are:: To return home. CMS Medicare.gov Compare Post Acute Care list provided to:: Patient    Expected Discharge Plan and Services   In-house Referral: PCP / Psychologist, educational, Financial Counselor Discharge Planning Services: CM Consult   Living arrangements for the past 2 months: [place]                                      Prior Living Arrangements/Services Living arrangements for the past 2 months: Single Family Home Lives with:: Self Patient language and need for interpreter reviewed:: Yes Do you feel safe going back to the place where you live?: Yes      Need for Family Participation in Patient Care: Yes (Comment) Care  giver support system in place?: Yes (comment)   Criminal Activity/Legal Involvement Pertinent to Current Situation/Hospitalization: No - Comment as needed  Activities of Daily Living      Permission Sought/Granted Permission sought to share information with : Case Manager, Family Supports, Facility Sport and exercise psychologist, PCP Permission granted to share information with : Yes, Verbal Permission Granted              Emotional Assessment Appearance:: Appears stated age Attitude/Demeanor/Rapport: Engaged Affect (typically observed): Accepting, Appropriate, Calm, Hopeful Orientation: : Oriented to Self, Oriented to Place, Oriented to  Time, Oriented to Situation Alcohol / Substance Use: Not Applicable Psych Involvement: No (comment)  Admission diagnosis:  Hiccup [R06.6] Upper GI bleeding [K92.2] Upper GI bleed [K92.2] Elevated creatine kinase [R74.8] Pain of right thigh [M79.651] Chest pain, unspecified type [R07.9] Patient Active Problem List   Diagnosis Date Noted   Upper GI bleeding 08/21/2021   Essential hypertension 08/21/2021   Elevated troponin 08/21/2021   Bilateral leg pain 08/21/2021   Agitation 08/21/2021   Blood in the stool    Normocytic anemia    PCP:  Pcp, No Pharmacy:   [place], [place] - [address] [place] [address] Phone: [phone] Fax: [phone]     Social Determinants of Health (SDOH) Interventions    Readmission Risk Interventions No flowsheet data found.

## 2021-08-22 NOTE — Consult Note (Signed)
[facility] Face-to-Face Psychiatry Consult   Reason for Consult: Psychosis Referring Physician: Dr. Lorin Mercy Patient Identification: Chad Santos MRN:  [id number] Principal Diagnosis: Upper GI bleeding Diagnosis:  Principal Problem:   Upper GI bleeding Active Problems:   Essential hypertension   Elevated troponin   Bilateral leg pain   Agitation   Blood in the stool   Normocytic anemia   Total Time spent with patient: 45 minutes  Assessment:   Chad Santos is a 61 y.o. male patient with past medical history of HTN admitted medically for chest pain and pain in his legs.  Patient was found to have upper GI bleed with hemoglobin of 9.9.  Psych consulted for psychosis.  Pt seen today.  Denies any depression, or anxiety. Endorses insomnia. Denies SI, HI, AVH.  Patient denies any past psych history or hospitalizations.  He answers questions appropriately and is not responding to internal stimuli.  He is alert and oriented.  Patient does not meet criteria for inpatient hospitalization. Patient does have poor healthcare literacy and does not know about his diagnosis or need for endoscopy or colonoscopy.  Recommend primary team to explain to him his diagnosis and endoscopy or colonoscopy if needed.  Primary team can use low-dose Haldol for agitation if needed.  Recommendations Safety Patient is low Risk for self-harm.  Labs reviewed: CMP shows creatinine 1.38, AST ALT within normal limits, albumin 3.3, UDS positive for opiates and THC.  Hemoglobin 8.7, TIBC 195, ferritin -normal, CT head -normal 06/21/21-TSH 0.598, vitamin D low at 10.20, vitamin B12 262, folate 13.6.  Medications -Primary team can use low-dose Haldol for agitation if needed.  Disposition -Per Primary. Patient does not meet criteria for inpatient hospitalization.   Thank you for this psych consult.  Psychiatry will sign off.    Subjective:  Pt states "I am doing a lot better than I was doing yesterday". States he came  in for his stomach and his leg, but he's not sure of any test results. He does remember hearing that his blood was low. Overall poor healthcare literacy (did not recognize terms anemia, hemoglobin).  When asked if somebody has talked to him about his diagnosis and endoscopy or colonoscopy needs.  He states that nobody talked to him about that.  He states he generally denies depression, anxiety. Has poor sleep. He does not remember the names of any of the medications he has gotten for sleep recently. He denies pain. He denies racing thoughts or stressors. He denies any relationship issues. He states that he "doesn't do anything except collect disability". Collects disability for his leg. Has siblings who live in [place], but not in touch with them. Denies isolating himself. No thoughts of hurting self or others. Denies hallucinations. Denies paranoia. Feels appetite is poor. Denies any history of abuse. Drinks maybe 1 beer/week. Remote marijuana use. Smokes cigarettes - ~1 ppd.   Oriented to self, situation, date month, year, president, and president - incorrectly gave past president as Mady Gemma.    Has never been admitted to a psych hospital. No hx self-harm. No family hx mental illness.    Chart review: Patient had been to ER and Urgent care  9 times since 12/31/20 for various reasons-frontal headache, decreased appetite, hiccups, leg pain, insomnia, anorexia and back pain.  He had been prescribed trazodone 100 mg as needed for insomnia and Vistaril 50 mg every 6 hours for anxiety.  Past Psychiatric History: none  Risk to Self:  No Risk to Others:  No Prior Inpatient  Therapy:  No Prior Outpatient Therapy:  No  Past Medical History:  Past Medical History:  Diagnosis Date   Hypertension     Past Surgical History:  Procedure Laterality Date   HAND SURGERY Right    Family History:  Family History  Family history unknown: Yes   Family Psychiatric  History: none Social History:  Social  History   Substance and Sexual Activity  Alcohol Use Yes   Comment: occasionally,6 pack/month     Social History   Substance and Sexual Activity  Drug Use Never    Social History   Socioeconomic History   Marital status: Single    Spouse name: Not on file   Number of children: Not on file   Years of education: Not on file   Highest education level: Not on file  Occupational History   Occupation: unemployed  Tobacco Use   Smoking status: Every Day    Packs/day: 1.00    Years: 37.00    Pack years: 37.00    Types: Cigarettes   Smokeless tobacco: Never   Tobacco comments:    Reports <1ppd  Vaping Use   Vaping Use: Never used  Substance and Sexual Activity   Alcohol use: Yes    Comment: occasionally,6 pack/month   Drug use: Never   Sexual activity: Not on file  Other Topics Concern   Not on file  Social History Narrative   Not on file   Social Determinants of Health   Financial Resource Strain: Not on file  Food Insecurity: Not on file  Transportation Needs: Not on file  Physical Activity: Not on file  Stress: Not on file  Social Connections: Not on file   Additional Social History:    Allergies:  No Known Allergies  Labs:  Results for orders placed or performed during the hospital encounter of 08/21/21 (from the past 48 hour(s))  Basic metabolic panel     Status: Abnormal   Collection Time: 08/21/21  7:27 AM  Result Value Ref Range   Sodium 137 135 - 145 mmol/L   Potassium 3.5 3.5 - 5.1 mmol/L   Chloride 100 98 - 111 mmol/L   CO2 26 22 - 32 mmol/L   Glucose, Bld 109 (H) 70 - 99 mg/dL    Comment: Glucose reference range applies only to samples taken after fasting for at least 8 hours.   BUN 65 (H) 8 - 23 mg/dL   Creatinine, Ser 1.91 (H) 0.61 - 1.24 mg/dL   Calcium 8.7 (L) 8.9 - 10.3 mg/dL   GFR, Estimated 39 (L) >60 mL/min    Comment: (NOTE) Calculated using the CKD-EPI Creatinine Equation (2021)    Anion gap 11 5 - 15    Comment: Performed at  [facility] [address] [place],  [address]  CBC     Status: Abnormal   Collection Time: 08/21/21  7:27 AM  Result Value Ref Range   WBC 7.6 4.0 - 10.5 K/uL   RBC 3.18 (L) 4.22 - 5.81 MIL/uL   Hemoglobin 9.9 (L) 13.0 - 17.0 g/dL   HCT 30.0 (L) 39.0 - 52.0 %   MCV 94.3 80.0 - 100.0 fL   MCH 31.1 26.0 - 34.0 pg   MCHC 33.0 30.0 - 36.0 g/dL   RDW 15.0 11.5 - 15.5 %   Platelets 187 150 - 400 K/uL   nRBC 0.0 0.0 - 0.2 %    Comment: Performed at [facility], [place] [address]  [place], [place] [address]  Troponin I (High Sensitivity)     Status: Abnormal   Collection Time: 08/21/21  7:27 AM  Result Value Ref Range   Troponin I (High Sensitivity) 38 (H) <18 ng/L    Comment: (NOTE) Elevated high sensitivity troponin I (hsTnI) values and significant  changes across serial measurements may suggest ACS but many other  chronic and acute conditions are known to elevate hsTnI results.  Refer to the "Links" section for chest pain algorithms and additional  guidance. Performed at [facility], [place] [address], [place], [place] [address]   ABO/Rh     Status: None   Collection Time: 08/21/21  7:27 AM  Result Value Ref Range   ABO/RH(D)      O POS Performed at [facility] [address], [place], [place] [address]   Resp Panel by RT-PCR (Flu A&B, Covid) Nasopharyngeal Swab     Status: None   Collection Time: 08/21/21  7:47 AM   Specimen: Nasopharyngeal Swab; Nasopharyngeal(NP) swabs in vial transport medium  Result Value Ref Range   SARS Coronavirus 2 by RT PCR NEGATIVE NEGATIVE    Comment: (NOTE) SARS-CoV-2 target nucleic acids are NOT DETECTED.  The SARS-CoV-2 RNA is generally detectable in upper respiratory specimens during the acute phase of infection. The lowest concentration of SARS-CoV-2 viral copies this assay can detect is 138 copies/mL. A negative result does not preclude SARS-Cov-2 infection and should not be used as the sole basis for  treatment or other patient management decisions. A negative result may occur with  improper specimen collection/handling, submission of specimen other than nasopharyngeal swab, presence of viral mutation(s) within the areas targeted by this assay, and inadequate number of viral copies(<138 copies/mL). A negative result must be combined with clinical observations, patient history, and epidemiological information. The expected result is Negative.  Fact Sheet for Patients:  EntrepreneurPulse.com.au  Fact Sheet for Healthcare Providers:  IncredibleEmployment.be  This test is no t yet approved or cleared by the [place] FDA and  has been authorized for detection and/or diagnosis of SARS-CoV-2 by FDA under an Emergency Use Authorization (EUA). This EUA will remain  in effect (meaning this test can be used) for the duration of the COVID-19 declaration under Section 564(b)(1) of the Act, 21 U.S.C.section 360bbb-3(b)(1), unless the authorization is terminated  or revoked sooner.       Influenza A by PCR NEGATIVE NEGATIVE   Influenza B by PCR NEGATIVE NEGATIVE    Comment: (NOTE) The Xpert Xpress SARS-CoV-2/FLU/RSV plus assay is intended as an aid in the diagnosis of influenza from Nasopharyngeal swab specimens and should not be used as a sole basis for treatment. Nasal washings and aspirates are unacceptable for Xpert Xpress SARS-CoV-2/FLU/RSV testing.  Fact Sheet for Patients: EntrepreneurPulse.com.au  Fact Sheet for Healthcare Providers: IncredibleEmployment.be  This test is not yet approved or cleared by the [place] FDA and has been authorized for detection and/or diagnosis of SARS-CoV-2 by FDA under an Emergency Use Authorization (EUA). This EUA will remain in effect (meaning this test can be used) for the duration of the COVID-19 declaration under Section 564(b)(1) of the Act, 21 U.S.C. section  360bbb-3(b)(1), unless the authorization is terminated or revoked.  Performed at [facility], [place] [address], [place],  [address]   Hepatic function panel     Status: Abnormal   Collection Time: 08/21/21  9:27 AM  Result Value Ref Range   Total Protein 5.5 (L) 6.5 -  8.1 g/dL   Albumin 3.3 (L) 3.5 - 5.0 g/dL   AST 29 15 - 41 U/L   ALT 26 0 - 44 U/L   Alkaline Phosphatase 46 38 - 126 U/L   Total Bilirubin 0.7 0.3 - 1.2 mg/dL   Bilirubin, Direct 0.2 0.0 - 0.2 mg/dL   Indirect Bilirubin 0.5 0.3 - 0.9 mg/dL    Comment: Performed at [facility] [address] [place], [place] [address]  Lipase, blood     Status: None   Collection Time: 08/21/21  9:27 AM  Result Value Ref Range   Lipase 25 11 - 51 U/L    Comment: Performed at [facility] [address], [place], [place] [address]  Troponin I (High Sensitivity)     Status: Abnormal   Collection Time: 08/21/21  9:27 AM  Result Value Ref Range   Troponin I (High Sensitivity) 31 (H) <18 ng/L    Comment: (NOTE) Elevated high sensitivity troponin I (hsTnI) values and significant  changes across serial measurements may suggest ACS but many other  chronic and acute conditions are known to elevate hsTnI results.  Refer to the "Links" section for chest pain algorithms and additional  guidance. Performed at [facility], [place] [address] [place], [place] [address]   POC occult blood, ED     Status: Abnormal   Collection Time: 08/21/21 11:19 AM  Result Value Ref Range   Occult Blood, Stool #1 POSITIVE (A)   Type and screen [place]     Status: None   Collection Time: 08/21/21 11:30 AM  Result Value Ref Range   ABO/RH(D) O POS    Antibody Screen NEG    Sample Expiration      08/24/2021,2359 Performed at [facility], [place] [address], [place], [place] [address]   Urine rapid drug screen (hosp performed)     Status: Abnormal   Collection Time: 08/21/21  1:23 PM  Result Value  Ref Range   Opiates POSITIVE (A) NONE DETECTED   Cocaine NONE DETECTED NONE DETECTED   Benzodiazepines NONE DETECTED NONE DETECTED   Amphetamines NONE DETECTED NONE DETECTED   Tetrahydrocannabinol POSITIVE (A) NONE DETECTED   Barbiturates NONE DETECTED NONE DETECTED    Comment: (NOTE) DRUG SCREEN FOR MEDICAL PURPOSES ONLY.  IF CONFIRMATION IS NEEDED FOR ANY PURPOSE, NOTIFY LAB WITHIN 5 DAYS.  LOWEST DETECTABLE LIMITS FOR URINE DRUG SCREEN Drug Class                     Cutoff (ng/mL) Amphetamine and metabolites    1000 Barbiturate and metabolites    200 Benzodiazepine                 606 Tricyclics and metabolites     300 Opiates and metabolites        300 Cocaine and metabolites        300 THC                            50 Performed at [facility], [place] [address] [place], [place] [address]   HIV Antibody (routine testing w rflx)     Status: None   Collection Time: 08/21/21  4:52 PM  Result Value Ref Range   HIV Screen 4th Generation wRfx Non Reactive Non Reactive    Comment: Performed at [facility], [place] [address],  [place], [place] [address]  CBC     Status: Abnormal   Collection Time: 08/21/21  4:52 PM  Result Value Ref Range   WBC 8.6 4.0 - 10.5 K/uL   RBC 2.88 (L) 4.22 - 5.81 MIL/uL   Hemoglobin 8.9 (L) 13.0 - 17.0 g/dL   HCT 27.6 (L) 39.0 - 52.0 %   MCV 95.8 80.0 - 100.0 fL   MCH 30.9 26.0 - 34.0 pg   MCHC 32.2 30.0 - 36.0 g/dL   RDW 15.2 11.5 - 15.5 %   Platelets 165 150 - 400 K/uL   nRBC 0.0 0.0 - 0.2 %    Comment: Performed at [facility], [place] [address] [place], [place] [address]  CBC     Status: Abnormal   Collection Time: 08/22/21  6:18 AM  Result Value Ref Range   WBC 6.8 4.0 - 10.5 K/uL   RBC 2.75 (L) 4.22 - 5.81 MIL/uL   Hemoglobin 8.7 (L) 13.0 - 17.0 g/dL   HCT 26.0 (L) 39.0 - 52.0 %   MCV 94.5 80.0 - 100.0 fL   MCH 31.6 26.0 - 34.0 pg   MCHC 33.5 30.0 - 36.0 g/dL   RDW 15.3 11.5 - 15.5 %   Platelets 161 150 - 400 K/uL    nRBC 0.0 0.0 - 0.2 %    Comment: Performed at [facility], [place] [address] [place], [place] [address]  Basic metabolic panel     Status: Abnormal   Collection Time: 08/22/21  6:18 AM  Result Value Ref Range   Sodium 142 135 - 145 mmol/L   Potassium 3.9 3.5 - 5.1 mmol/L   Chloride 108 98 - 111 mmol/L   CO2 26 22 - 32 mmol/L   Glucose, Bld 81 70 - 99 mg/dL    Comment: Glucose reference range applies only to samples taken after fasting for at least 8 hours.   BUN 35 (H) 8 - 23 mg/dL   Creatinine, Ser 1.38 (H) 0.61 - 1.24 mg/dL   Calcium 8.5 (L) 8.9 - 10.3 mg/dL   GFR, Estimated 58 (L) >60 mL/min    Comment: (NOTE) Calculated using the CKD-EPI Creatinine Equation (2021)    Anion gap 8 5 - 15    Comment: Performed at [facility] [address], [place], [place] [address]  Iron and TIBC     Status: Abnormal   Collection Time: 08/22/21  6:18 AM  Result Value Ref Range   Iron 56 45 - 182 ug/dL   TIBC 195 (L) 250 - 450 ug/dL   Saturation Ratios 29 17.9 - 39.5 %   UIBC 139 ug/dL    Comment: Performed at [facility], [place] [address] [place], [place] [address]  Ferritin     Status: None   Collection Time: 08/22/21  6:18 AM  Result Value Ref Range   Ferritin 306 24 - 336 ng/mL    Comment: Performed at [facility], [place] [address], [place], [place] [address]  Reticulocytes     Status: Abnormal   Collection Time: 08/22/21  6:18 AM  Result Value Ref Range   Retic Ct Pct 2.2 0.4 - 3.1 %   RBC. 2.80 (L) 4.22 - 5.81 MIL/uL   Retic Count, Absolute 60.5 19.0 - 186.0 K/uL   Immature Retic Fract 7.9 2.3 - 15.9 %    Comment: Performed at [facility] [address], [place], [place] [address]  Current Facility-Administered Medications  Medication Dose Route Frequency Provider Last Rate Last Admin   acetaminophen (TYLENOL) tablet 650 mg  650 mg Oral Q6H PRN Karmen Bongo, MD       Or   acetaminophen (TYLENOL) suppository 650 mg  650 mg Rectal Q6H PRN  Karmen Bongo, MD       hydrALAZINE (APRESOLINE) injection 5 mg  5 mg Intravenous Q4H PRN Karmen Bongo, MD       lactated ringers infusion   Intravenous Continuous Karmen Bongo, MD 100 mL/hr at 08/22/21 1248 New Bag at 08/22/21 1248   methocarbamol (ROBAXIN) tablet 500 mg  500 mg Oral Q6H PRN Karmen Bongo, MD       metoCLOPramide (REGLAN) injection 10 mg  10 mg Intravenous Q6H Gribbin, Sarah J, PA-C       morphine 2 MG/ML injection 2 mg  2 mg Intravenous Q2H PRN Karmen Bongo, MD   2 mg at 08/21/21 1314   ondansetron (ZOFRAN) tablet 4 mg  4 mg Oral Q6H PRN Karmen Bongo, MD       Or   ondansetron [facility]) injection 4 mg  4 mg Intravenous Q6H PRN Karmen Bongo, MD       pantoprazole (PROTONIX) injection 40 mg  40 mg Intravenous Q12H Zehr, Jessica D, PA-C   40 mg at 08/22/21 0824   peg 3350 powder (MOVIPREP) kit 100 g  0.5 kit Oral Once Gribbin, Sarah J, PA-C       And   peg 3350 powder (MOVIPREP) kit 100 g  0.5 kit Oral Once Vena Rua, PA-C        Musculoskeletal: Strength & Muscle Tone:  Not tested Gait & Station:  Deferred Patient leans: Backward            Psychiatric Specialty Exam:  Presentation  General Appearance: Appropriate for Environment Eye Contact:Fair Speech:Clear and Coherent; Normal Rate Speech Volume:Normal Handedness:No data recorded  Mood and Affect  Mood:Euthymic Affect:Appropriate  Thought Process  Thought Processes:Coherent Descriptions of Associations:Intact  Orientation:Full (Time, Place and Person)  Thought Content:Logical; WDL  History of Schizophrenia/Schizoaffective disorder:No data recorded Duration of Psychotic Symptoms:No data recorded Hallucinations:Hallucinations: Other (comment)  Ideas of Reference:None  Suicidal Thoughts:Suicidal Thoughts: No  Homicidal Thoughts:Homicidal Thoughts: No   Sensorium  Memory:Recent Fair; Remote Fair; Immediate Poor Judgment:Fair Insight:Fair  Executive Functions   Concentration:Good Attention Span:Good Junction of Knowledge:Poor Language:Good  Psychomotor Activity  Psychomotor Activity:Psychomotor Activity: Normal  Assets  Assets:Communication Skills; Desire for Improvement; Physical Health; Social Support; Housing  Sleep  Sleep:Sleep: Poor  Physical Exam: Physical Exam Vitals reviewed.  Constitutional:      General: He is not in acute distress.    Appearance: He is not ill-appearing, toxic-appearing or diaphoretic.  HENT:     Head: Normocephalic.  Pulmonary:     Effort: Pulmonary effort is normal.  Neurological:     Mental Status: He is alert.   Review of Systems  Psychiatric/Behavioral:  Negative for depression, hallucinations, memory loss, substance abuse and suicidal ideas. The patient has insomnia. The patient is not nervous/anxious.   Blood pressure 136/78, pulse 76, temperature 98.3 F (36.8 C), temperature source Oral, resp. rate 19, SpO2 98 %. There is no height or weight on file to calculate BMI.  PGy2 Armando Reichert, MD 08/22/2021 3:49 PM

## 2021-08-23 ENCOUNTER — Inpatient Hospital Stay (HOSPITAL_COMMUNITY): Payer: Medicaid Other | Admitting: Certified Registered"

## 2021-08-23 ENCOUNTER — Telehealth: Payer: Self-pay

## 2021-08-23 ENCOUNTER — Encounter (HOSPITAL_COMMUNITY): Payer: Self-pay | Admitting: Internal Medicine

## 2021-08-23 ENCOUNTER — Encounter (HOSPITAL_COMMUNITY)
Admission: EM | Disposition: A | Discharge status: Home / Self Care | Payer: Self-pay | Source: Home / Self Care | Attending: Internal Medicine

## 2021-08-23 DIAGNOSIS — K226 Gastro-esophageal laceration-hemorrhage syndrome: Secondary | ICD-10-CM

## 2021-08-23 DIAGNOSIS — D509 Iron deficiency anemia, unspecified: Secondary | ICD-10-CM

## 2021-08-23 DIAGNOSIS — K259 Gastric ulcer, unspecified as acute or chronic, without hemorrhage or perforation: Secondary | ICD-10-CM

## 2021-08-23 DIAGNOSIS — K635 Polyp of colon: Secondary | ICD-10-CM

## 2021-08-23 HISTORY — PX: COLONOSCOPY WITH PROPOFOL: SHX5780

## 2021-08-23 HISTORY — PX: ESOPHAGOGASTRODUODENOSCOPY (EGD) WITH PROPOFOL: SHX5813

## 2021-08-23 HISTORY — PX: POLYPECTOMY: SHX5525

## 2021-08-23 HISTORY — PX: BIOPSY: SHX5522

## 2021-08-23 LAB — CBC WITH DIFFERENTIAL/PLATELET
Abs Immature Granulocytes: 0.01 10*3/uL (ref 0.00–0.07)
Basophils Absolute: 0.1 10*3/uL (ref 0.0–0.1)
Basophils Relative: 1 %
Eosinophils Absolute: 0.1 10*3/uL (ref 0.0–0.5)
Eosinophils Relative: 2 %
HCT: 24.2 % — ABNORMAL LOW (ref 39.0–52.0)
Hemoglobin: 8.2 g/dL — ABNORMAL LOW (ref 13.0–17.0)
Immature Granulocytes: 0 %
Lymphocytes Relative: 41 %
Lymphs Abs: 2.6 10*3/uL (ref 0.7–4.0)
MCH: 31.9 pg (ref 26.0–34.0)
MCHC: 33.9 g/dL (ref 30.0–36.0)
MCV: 94.2 fL (ref 80.0–100.0)
Monocytes Absolute: 0.6 10*3/uL (ref 0.1–1.0)
Monocytes Relative: 9 %
Neutro Abs: 3 10*3/uL (ref 1.7–7.7)
Neutrophils Relative %: 47 %
Platelets: 160 10*3/uL (ref 150–400)
RBC: 2.57 MIL/uL — ABNORMAL LOW (ref 4.22–5.81)
RDW: 14.8 % (ref 11.5–15.5)
WBC: 6.3 10*3/uL (ref 4.0–10.5)
nRBC: 0 % (ref 0.0–0.2)

## 2021-08-23 LAB — BASIC METABOLIC PANEL
Anion gap: 8 (ref 5–15)
BUN: 19 mg/dL (ref 8–23)
CO2: 24 mmol/L (ref 22–32)
Calcium: 8.3 mg/dL — ABNORMAL LOW (ref 8.9–10.3)
Chloride: 107 mmol/L (ref 98–111)
Creatinine, Ser: 1.26 mg/dL — ABNORMAL HIGH (ref 0.61–1.24)
GFR, Estimated: >60 mL/min (ref >60)
Glucose, Bld: 84 mg/dL (ref 70–99)
Potassium: 3.4 mmol/L — ABNORMAL LOW (ref 3.5–5.1)
Sodium: 139 mmol/L (ref 135–145)

## 2021-08-23 SURGERY — ESOPHAGOGASTRODUODENOSCOPY (EGD) WITH PROPOFOL
Anesthesia: Monitor Anesthesia Care

## 2021-08-23 MED ORDER — LACTATED RINGERS IV SOLN
INTRAVENOUS | Status: AC | PRN
  Administered 2021-08-23: 20 mL/h via INTRAVENOUS

## 2021-08-23 MED ORDER — PROPOFOL 10 MG/ML IV BOLUS
INTRAVENOUS | Status: DC | PRN
Start: 2021-08-23 — End: 2021-08-23
  Administered 2021-08-23: 50 mg via INTRAVENOUS

## 2021-08-23 MED ORDER — LOSARTAN POTASSIUM 50 MG PO TABS: 50.0000 mg | ORAL_TABLET | Freq: Every day | ORAL | 2 refills | Status: DC

## 2021-08-23 MED ORDER — PANTOPRAZOLE SODIUM 40 MG PO TBEC: 40.0000 mg | DELAYED_RELEASE_TABLET | Freq: Two times a day (BID) | ORAL | 2 refills | Status: DC

## 2021-08-23 MED ORDER — PROPOFOL 500 MG/50ML IV EMUL
INTRAVENOUS | Status: DC | PRN
  Administered 2021-08-23: 125 ug/kg/min via INTRAVENOUS

## 2021-08-23 SURGICAL SUPPLY — 25 items

## 2021-08-23 NOTE — Op Note (Signed)
[facility] Patient Name: Chad Santos Procedure Date : 08/23/2021 MRN: [id number] Attending MD: Georgian Co ,  Date of Birth: 01-07-1961 CSN: [id number] Age: 61 Admit Type: Inpatient Procedure:                Upper GI endoscopy Indications:              Iron deficiency anemia Providers:                Adline Mango" Carmin Richmond, RN,                            Benetta Spar, Technician Referring MD:             Hospitalist team Medicines:                Monitored Anesthesia Care Complications:            No immediate complications. Estimated Blood Loss:     Estimated blood loss was minimal. Procedure:                Pre-Anesthesia Assessment:                           - Prior to the procedure, a History and Physical                            was performed, and patient medications and                            allergies were reviewed. The patient's tolerance of                            previous anesthesia was also reviewed. The risks                            and benefits of the procedure and the sedation                            options and risks were discussed with the patient.                            All questions were answered, and informed consent                            was obtained. Prior Anticoagulants: The patient has                            taken no previous anticoagulant or antiplatelet                            agents. ASA Grade Assessment: II - A patient with                            mild systemic disease. After reviewing the risks  and benefits, the patient was deemed in                            satisfactory condition to undergo the procedure.                           After obtaining informed consent, the endoscope was                            passed under direct vision. Throughout the                            procedure, the patient's blood pressure, pulse, and                             oxygen saturations were monitored continuously. The                            GIF-H190 ([id number]) Olympus endoscope was introduced                            through the mouth, and advanced to the second part                            of duodenum. The upper GI endoscopy was                            accomplished without difficulty. The patient                            tolerated the procedure well. Scope In: Scope Out: Findings:      The examined esophagus was normal.      A medium-sized hiatal hernia was present.      A 10 mm non-bleeding Mallory-Weiss tear with stigmata of recent bleeding       was found.      Few non-bleeding cratered gastric ulcers with no stigmata of bleeding       were found in the gastric antrum. Biopsies were taken with a cold       forceps for Helicobacter pylori testing.      The examined duodenum was normal. Impression:               - Normal esophagus.                           - Medium-sized hiatal hernia.                           - Mallory-Weiss tear.                           - Non-bleeding gastric ulcers with no stigmata of                            bleeding. Biopsied.                           -  Normal examined duodenum. Recommendation:           - Await pathology results.                           - Use a proton pump inhibitor PO BID for 8 weeks.                           - Repeat upper endoscopy in 2 months to check                            healing.                           - Perform a colonoscopy today. Procedure Code(s):        --- Professional ---                           [phone], Esophagogastroduodenoscopy, flexible,                            transoral; with biopsy, single or multiple Diagnosis Code(s):        --- Professional ---                           K44.9, Diaphragmatic hernia without obstruction or                            gangrene                           K22.6, Gastro-esophageal laceration-hemorrhage                             syndrome                           K25.9, Gastric ulcer, unspecified as acute or                            chronic, without hemorrhage or perforation                           D50.9, Iron deficiency anemia, unspecified CPT copyright 2019 [employer] Medical [employer]. All rights reserved. The codes documented in this report are preliminary and upon coder review may  be revised to meet current compliance requirements. [name] "Christia Reading,  08/23/2021 11:02:03 AM Number of Addenda: 0

## 2021-08-23 NOTE — Discharge Summary (Signed)
Physician Discharge Summary  Chad Santos QJJ:[id number] DOB: [birth year] DOA: 08/21/2021  PCP: Merryl Hacker, No  Admit date: 08/21/2021 Discharge date: 08/23/2021  Admitted From: Home Discharge disposition: Home with home health RN, outpatient PT   Code Status: Full Code   Discharge Diagnosis:   Principal Problem:   Upper GI bleeding Active Problems:   Essential hypertension   Elevated troponin   Bilateral leg pain   Agitation   Blood in the stool   Normocytic anemia    Chief Complaint  Patient presents with   Chest Pain    Brief narrative: Chad Santos is a 61 y.o. male with PMH significant for hypertension  Patient presented to the ED on 08/12/2021 with complaint of chest pain, dyspnea and exertion for last few weeks.  Also reported black stool for same duration Patient lives at home alone.  He had sister reported that she had not heard from him in months.  He called her on 1/29 morning to take him to the hospital. In the ED, patient was hemodynamically stable. Labs with hemoglobin 9.9, BUN/creatinine 65/1.91 FOBT positive CT head unremarkable While in the ED, patient was also showing unusual behavior. Admitted to hospital service GI and psychiatry were consulted.  Subjective: Patient was seen and examined this morning.  Pleasant middle-aged African-American male.  His biggest concern at this time is being on clear liquid diet and not getting real food. Is oriented x3 on my evaluation.  Seen by psychiatry as well.  Assessment/Plan: Acute GI bleeding -Presented with melena, fatigue, dyspnea on exertion -Noted to be FOBT positive and had low hemoglobin level at 9.9 compared to 16 few weeks ago. -GI consulted.  Currently on Protonix IV 40 mg twice daily. -GI consult appreciated. -Patient underwent EGD this morning which showed a Mallory-Weiss tear, nonbleeding gastric ulcer and stigmata of bleeding.  GI recommended PPI twice daily for 8 weeks followed by repeat endoscopy  in 2 months to check healing.  Colonoscopy showed a 10 mm polyp in the ascending colon removed with a cold snare.  Diverticulosis in the sigmoid colon and nonbleeding internal hemorrhoids.  Acute blood loss anemia -Acute drop in hemoglobin due to GI bleeding, seems fairly recent.  Ferritin level remains normal at 306.   -Hemoglobin at 8.2 this morning. Recent Labs    06/21/21 1430 06/21/21 1433 08/21/21 0727 08/21/21 1652 08/22/21 0618 08/23/21 0355  HGB 16.3  --  9.9* 8.9* 8.7* 8.2*  MCV 93.9  --  94.3 95.8 94.5 94.2  VITAMINB12 262  --   --   --   --   --   FOLATE  --  13.6  --   --   --   --   FERRITIN  --   --   --   --  306  --   TIBC  --   --   --   --  195*  --   IRON  --   --   --   --  56  --   RETICCTPCT  --   --   --   --  2.2  --    Agitation -Noted to be agitated in the ED.   -Psychiatry consultation was obtained.  Per psychiatry note, patient denies any depression, anxiety or psychosis.  He is alert, awake and oriented.   Bilateral leg pain -Has advanced right hip joint changes and x-ray.  Currently on as needed Robaxin. -PT eval obtained.  Outpatient PT recommended for gait and balance.  Elevated troponin -Mild elevation in troponin likely associated with demand ischemia Recent Labs    08/21/21 0727 08/21/21 0927  TROPONINIHS 38* 31*   Essential hypertension -Resume losartan.  Recommend compliance.  Discharge Medications:   Allergies as of 08/23/2021   No Known Allergies      Medication List     STOP taking these medications    methocarbamol 500 MG tablet Commonly known as: ROBAXIN       TAKE these medications    losartan 50 MG tablet Commonly known as: Cozaar Take 1 tablet (50 mg total) by mouth daily.   pantoprazole 40 MG tablet Commonly known as: Protonix Take 1 tablet (40 mg total) by mouth 2 (two) times daily before a meal.        Wound care:     Discharge Instructions:   Discharge Instructions     Call MD for:   difficulty breathing, headache or visual disturbances   Complete by: As directed    Call MD for:  extreme fatigue   Complete by: As directed    Call MD for:  hives   Complete by: As directed    Call MD for:  persistant dizziness or light-headedness   Complete by: As directed    Call MD for:  persistant nausea and vomiting   Complete by: As directed    Call MD for:  severe uncontrolled pain   Complete by: As directed    Call MD for:  temperature >100.4   Complete by: As directed    Diet general   Complete by: As directed    Discharge instructions   Complete by: As directed    General discharge instructions:  Follow with Primary MD Pcp, No in 7 days   Get CBC/BMP checked in next visit within 1 week by PCP or SNF MD. (We routinely change or add medications that can affect your baseline labs and fluid status, therefore we recommend that you get the mentioned basic workup next visit with your PCP, your PCP may decide not to get them or add new tests based on their clinical decision)  On your next visit with your PCP, please get your medicines reviewed and adjusted.  Please request your PCP  to go over all hospital tests, procedures, radiology results at the follow up, please get all Hospital records sent to your PCP by signing hospital release before you go home.  Activity: As tolerated with Full fall precautions use walker/cane & assistance as needed  Avoid using any recreational substances like cigarette, tobacco, alcohol, or non-prescribed drug.  If you experience worsening of your admission symptoms, develop shortness of breath, life threatening emergency, suicidal or homicidal thoughts you must seek medical attention immediately by calling 911 or calling your MD immediately  if symptoms less severe.  You must read complete instructions/literature along with all the possible adverse reactions/side effects for all the medicines you take and that have been prescribed to you. Take any  new medicine only after you have completely understood and accepted all the possible adverse reactions/side effects.   Do not drive, operate heavy machinery, perform activities at heights, swimming or participation in water activities or provide baby sitting services if your were admitted for syncope or siezures until you have seen by Primary MD or a Neurologist and advised to do so again.  Do not drive when taking Pain medications.  Do not take more than prescribed Pain, Sleep and Anxiety Medications  Wear Seat belts while driving.  Please  note You were cared for by a hospitalist during your hospital stay. If you have any questions about your discharge medications or the care you received while you were in the hospital after you are discharged, you can call the unit and asked to speak with the hospitalist on call if the hospitalist that took care of you is not available. Once you are discharged, your primary care physician will handle any further medical issues. Please note that NO REFILLS for any discharge medications will be authorized once you are discharged, as it is imperative that you return to your primary care physician (or establish a relationship with a primary care physician if you do not have one) for your aftercare needs so that they can reassess your need for medications and monitor your lab values.   Increase activity slowly   Complete by: As directed        Follow ups:    Follow-up [facility] Follow up.   Why: call and make a appointment to establish a primary care MD Contact information: [place] [phone]                Discharge Exam:   Vitals:   08/23/21 1053 08/23/21 1108 08/23/21 1123 08/23/21 1215  BP: (!) 144/84 (!) 150/88 (!) 164/88 (!) 144/95  Pulse: 65 68 64 62  Resp: 11 19 14 16   Temp: 98.7 F (37.1 C)  98.7 F (37.1 C) 98.4 F (36.9 C)  TempSrc:     Oral  SpO2: 100% 100% 100% 100%  Weight:      Height:        Body mass index is 24.41 kg/m.   General exam: Pleasant, middle-aged African-American male.  Not in physical distress Skin: No rashes, lesions or ulcers. HEENT: Atraumatic, normocephalic, no obvious bleeding Lungs: Clear to auscultation bilaterally CVS: Regular rate and rhythm, no murmur GI/Abd soft, nontender, nondistended, bowel sound present CNS: Alert, awake, oriented x3, Psychiatry: Not restless or agitated at the time of my evaluation Extremities: No pedal edema, no calf tenderness  Time coordinating discharge: 35 minutes   The results of significant diagnostics from this hospitalization (including imaging, microbiology, ancillary and laboratory) are listed below for reference.    Procedures and Diagnostic Studies:   DG Chest 2 View  Result Date: 08/21/2021 CLINICAL DATA:  Chest pain and right lower extremity pain. EXAM: CHEST - 2 VIEW COMPARISON:  01/20/2021 FINDINGS: Normal heart, mediastinum and hila. Clear lungs.  No pleural effusion or pneumothorax. Skeletal structures are intact. IMPRESSION: No active cardiopulmonary disease. Electronically Signed   By: Lajean Manes M.D.   On: 08/21/2021 08:22   CT HEAD WO CONTRAST (5MM)  Result Date: 08/21/2021 CLINICAL DATA:  Delirium. EXAM: CT HEAD WITHOUT CONTRAST TECHNIQUE: Contiguous axial images were obtained from the base of the skull through the vertex without intravenous contrast. RADIATION DOSE REDUCTION: This exam was performed according to the departmental dose-optimization program which includes automated exposure control, adjustment of the mA and/or kV according to patient size and/or use of iterative reconstruction technique. COMPARISON:  December 31, 2020. FINDINGS: Brain: Mild chronic ischemic white matter disease is noted. No mass effect or midline shift is noted. Ventricular size is within normal limits. There is no evidence of mass lesion, hemorrhage or acute  infarction. Vascular: No hyperdense vessel or unexpected calcification. Skull: Normal. Negative for fracture or focal lesion. Sinuses/Orbits: No acute finding. Other: None. IMPRESSION:  No acute intracranial abnormality seen. Electronically Signed   By: Marijo Conception M.D.   On: 08/21/2021 17:09   DG Femur Min 2 Views Right  Result Date: 08/21/2021 CLINICAL DATA:  Right thigh region pain. EXAM: RIGHT FEMUR 2 VIEWS COMPARISON:  Pelvis and right hip radiographs dated 07/10/2020. FINDINGS: No fracture or bone lesion. There is right hip joint space narrowing, most evident superiorly, associated with a collar of osteophytes along the base of the femoral head. Joint space narrowing has increased compared to the prior exam. Knee joint is not well assessed but is normally aligned with no gross degenerative/arthropathic changes and no joint effusion. There scattered femoral artery atherosclerotic calcifications. IMPRESSION: 1. No fracture, bone lesion or acute finding. 2. Advanced right hip joint arthropathic/degenerative changes. Electronically Signed   By: Lajean Manes M.D.   On: 08/21/2021 08:24     Labs:   Basic Metabolic Panel: Recent Labs  Lab 08/21/21 0727 08/22/21 0618 08/23/21 0355  NA 137 142 139  K 3.5 3.9 3.4*  CL 100 108 107  CO2 26 26 24   GLUCOSE 109* 81 84  BUN 65* 35* 19  CREATININE 1.91* 1.38* 1.26*  CALCIUM 8.7* 8.5* 8.3*   GFR Estimated Creatinine Clearance: 68.7 mL/min (A) (by C-G formula based on SCr of 1.26 mg/dL (H)). Liver Function Tests: Recent Labs  Lab 08/21/21 0927  AST 29  ALT 26  ALKPHOS 46  BILITOT 0.7  PROT 5.5*  ALBUMIN 3.3*   Recent Labs  Lab 08/21/21 0927  LIPASE 25   No results for input(s): AMMONIA in the last 168 hours. Coagulation profile No results for input(s): INR, PROTIME in the last 168 hours.  CBC: Recent Labs  Lab 08/21/21 0727 08/21/21 1652 08/22/21 0618 08/23/21 0355  WBC 7.6 8.6 6.8 6.3  NEUTROABS  --   --   --  3.0  HGB  9.9* 8.9* 8.7* 8.2*  HCT 30.0* 27.6* 26.0* 24.2*  MCV 94.3 95.8 94.5 94.2  PLT 187 165 161 160   Cardiac Enzymes: No results for input(s): CKTOTAL, CKMB, CKMBINDEX, TROPONINI in the last 168 hours. BNP: Invalid input(s): POCBNP CBG: No results for input(s): GLUCAP in the last 168 hours. D-Dimer No results for input(s): DDIMER in the last 72 hours. Hgb A1c No results for input(s): HGBA1C in the last 72 hours. Lipid Profile No results for input(s): CHOL, HDL, LDLCALC, TRIG, CHOLHDL, LDLDIRECT in the last 72 hours. Thyroid function studies No results for input(s): TSH, T4TOTAL, T3FREE, THYROIDAB in the last 72 hours.  Invalid input(s): FREET3 Anemia work up Recent Labs    08/22/21 0618  FERRITIN 306  TIBC 195*  IRON 56  RETICCTPCT 2.2   Microbiology Recent Results (from the past 240 hour(s))  Resp Panel by RT-PCR (Flu A&B, Covid) Nasopharyngeal Swab     Status: None   Collection Time: 08/21/21  7:47 AM   Specimen: Nasopharyngeal Swab; Nasopharyngeal(NP) swabs in vial transport medium  Result Value Ref Range Status   SARS Coronavirus 2 by RT PCR NEGATIVE NEGATIVE Final    Comment: (NOTE) SARS-CoV-2 target nucleic acids are NOT DETECTED.  The SARS-CoV-2 RNA is generally detectable in upper respiratory specimens during the acute phase of infection. The lowest concentration of SARS-CoV-2 viral copies this assay can detect is 138 copies/mL. A negative result does not preclude SARS-Cov-2 infection and should not be used as the sole basis for treatment or other patient management decisions. A negative result may occur with  improper specimen collection/handling, submission  of specimen other than nasopharyngeal swab, presence of viral mutation(s) within the areas targeted by this assay, and inadequate number of viral copies(<138 copies/mL). A negative result must be combined with clinical observations, patient history, and epidemiological information. The expected result is  Negative.  Fact Sheet for Patients:  EntrepreneurPulse.com.au  Fact Sheet for Healthcare Providers:  IncredibleEmployment.be  This test is no t yet approved or cleared by the [place] FDA and  has been authorized for detection and/or diagnosis of SARS-CoV-2 by FDA under an Emergency Use Authorization (EUA). This EUA will remain  in effect (meaning this test can be used) for the duration of the COVID-19 declaration under Section 564(b)(1) of the Act, 21 U.S.C.section 360bbb-3(b)(1), unless the authorization is terminated  or revoked sooner.       Influenza A by PCR NEGATIVE NEGATIVE Final   Influenza B by PCR NEGATIVE NEGATIVE Final    Comment: (NOTE) The Xpert Xpress SARS-CoV-2/FLU/RSV plus assay is intended as an aid in the diagnosis of influenza from Nasopharyngeal swab specimens and should not be used as a sole basis for treatment. Nasal washings and aspirates are unacceptable for Xpert Xpress SARS-CoV-2/FLU/RSV testing.  Fact Sheet for Patients: EntrepreneurPulse.com.au  Fact Sheet for Healthcare Providers: IncredibleEmployment.be  This test is not yet approved or cleared by the [place] FDA and has been authorized for detection and/or diagnosis of SARS-CoV-2 by FDA under an Emergency Use Authorization (EUA). This EUA will remain in effect (meaning this test can be used) for the duration of the COVID-19 declaration under Section 564(b)(1) of the Act, 21 U.S.C. section 360bbb-3(b)(1), unless the authorization is terminated or revoked.  Performed at [facility], [place] [address] [place], [place] [address]      Signed: Terrilee Croak  [facility] 08/23/2021, 2:04 PM

## 2021-08-23 NOTE — Interval H&P Note (Signed)
History and Physical Interval Note:  08/23/2021 9:40 AM  Chad Santos  has presented today for surgery, with the diagnosis of Anemia.  FOBT positive stool.  Staff reports dark, tarry stool.  The various methods of treatment have been discussed with the patient and family. After consideration of risks, benefits and other options for treatment, the patient has consented to  Procedure(s): ESOPHAGOGASTRODUODENOSCOPY (EGD) WITH PROPOFOL (N/A) COLONOSCOPY WITH PROPOFOL (N/A) as a surgical intervention.  The patient's history has been reviewed, patient examined, no change in status, stable for surgery.  I have reviewed the patient's chart and labs.  Questions were answered to the patient's satisfaction.     [name]

## 2021-08-23 NOTE — Progress Notes (Signed)
DISCHARGE NOTE HOME Chad Santos to be discharged Home per MD order. Discussed prescriptions and follow up appointments with the patient. Prescriptions given to patient; medication list explained in detail. Patient verbalized understanding.  Skin clean, dry and intact without evidence of skin break down, no evidence of skin tears noted. IV catheter discontinued intact. Site without signs and symptoms of complications. Dressing and pressure applied. Pt denies pain at the site currently. No complaints noted.  Patient free of lines, drains, and wounds.   An After Visit Summary (AVS) was printed and given to the patient. Patient escorted via wheelchair, and discharged home via private auto.  Berneta Levins, RN

## 2021-08-23 NOTE — Telephone Encounter (Signed)
-----   Message from [name], MD sent at 08/23/2021 11:28 AM EST ----- Hi Ammie, let's arrange for hospital follow up clinic visit with APP or Dr. Tarri Glenn in 3-4 weeks and repeat EGD in 8 weeks with Dr. Tarri Glenn to follow up healing of Mallory-Weiss tear and gastric ulcers.   Thanks, Lyndee Leo

## 2021-08-23 NOTE — Plan of Care (Signed)
°  Problem: Activity: Goal: Risk for activity intolerance will decrease Outcome: Progressing   Problem: Coping: Goal: Level of anxiety will decrease Outcome: Progressing   Problem: Elimination: Goal: Will not experience complications related to bowel motility Outcome: Progressing   Problem: Elimination: Goal: Will not experience complications related to urinary retention Outcome: Progressing

## 2021-08-23 NOTE — Telephone Encounter (Signed)
Left message for patient to please call back. 

## 2021-08-23 NOTE — Evaluation (Signed)
Physical Therapy Evaluation Patient Details Name: Chad Santos MRN: [id number] DOB: 07/16/1961 Today's Date: 08/23/2021  History of Present Illness  61 yo male presented to the ED on 08/12/2021 with complaint of chest pain, dyspnea and exertion for last few weeks.  Also reported black stool for same duration, upper GI Bleed; some agitation in ED; PMH with HTN  Clinical Impression   Patient evaluated by Physical Therapy with no further acute PT needs identified. All education has been completed and the patient has no further questions. Discussed use of cane and going to outpatient PT for a closer look at his gait and balance, and to decr his risk of falls; Overall walking in the hallways well, with and without pushing IV pole; OK for dc home from PT standpoint;  See below for any follow-up Physical Therapy or equipment needs. PT is signing off. Thank you for this referral.        Recommendations for follow up therapy are one component of a multi-disciplinary discharge planning process, led by the attending physician.  Recommendations may be updated based on patient status, additional functional criteria and insurance authorization.  Follow Up Recommendations Outpatient PT (Gait and balance)    Assistance Recommended at Discharge PRN  Patient can return home with the following       Equipment Recommendations Cane  Recommendations for Other Services       Functional Status Assessment Patient has had a recent decline in their functional status and/or demonstrates limited ability to make significant improvements in function in a reasonable and predictable amount of time     Precautions / Restrictions Precautions Precautions: Fall Precaution Comments: Fall risk is present, but low      Mobility  Bed Mobility Overal bed mobility: Independent                  Transfers Overall transfer level: Independent                      Ambulation/Gait Ambulation/Gait  assistance: Supervision, Modified independent (Device/Increase time) Gait Distance (Feet): 300 Feet Assistive device: None, IV Pole Gait Pattern/deviations: Decreased dorsiflexion - right       General Gait Details: Decr ankle coordination and decr dorsiflexion during swing; mild but present foot inversion during stance; Opted to use pushing the IV Pole in his L hand as a makeshift cane, which increased his overall steadiness with ambulation  Stairs            Wheelchair Mobility    Modified Rankin (Stroke Patients Only)       Balance                                             Pertinent Vitals/Pain Pain Assessment Pain Assessment: No/denies pain    Home Living Family/patient expects to be discharged to:: Private residence Living Arrangements: Alone Available Help at Discharge: Family;Friend(s);Available PRN/intermittently Type of Home: House Home Access: Stairs to enter Entrance Stairs-Rails: Right Entrance Stairs-Number of Steps: 5   Home Layout: One level Home Equipment: None      Prior Function Prior Level of Function : Independent/Modified Independent;History of Falls (last six months)             Mobility Comments: Has R knee pain, which has limited activities he enjoys       Hand Dominance  Extremity/Trunk Assessment   Upper Extremity Assessment Upper Extremity Assessment: Overall WFL for tasks assessed    Lower Extremity Assessment Lower Extremity Assessment: RLE deficits/detail;LLE deficits/detail RLE Deficits / Details: Noted decr dorsiflexion during advancement, with decr ankle coordination during gait cycle; Reports problems with toe catch and recent falls       Communication   Communication: No difficulties  Cognition Arousal/Alertness: Awake/alert Behavior During Therapy: WFL for tasks assessed/performed Overall Cognitive Status: Within Functional Limits for tasks assessed                                           General Comments General comments (skin integrity, edema, etc.): Discussed benefits of using a cane for more stabiltiy and to prevent further falls; Also discussed possibility of going to Outpt PT for gait and balance    Exercises     Assessment/Plan    PT Assessment All further PT needs can be met in the next venue of care  PT Problem List Decreased balance;Decreased coordination;Decreased knowledge of precautions       PT Treatment Interventions      PT Goals (Current goals can be found in the Care Plan section)  Acute Rehab PT Goals Patient Stated Goal: To eat lunch PT Goal Formulation: All assessment and education complete, DC therapy    Frequency       Co-evaluation               AM-PAC PT "6 Clicks" Mobility  Outcome Measure Help needed turning from your back to your side while in a flat bed without using bedrails?: None Help needed moving from lying on your back to sitting on the side of a flat bed without using bedrails?: None Help needed moving to and from a bed to a chair (including a wheelchair)?: None Help needed standing up from a chair using your arms (e.g., wheelchair or bedside chair)?: None Help needed to walk in hospital room?: None Help needed climbing 3-5 steps with a railing? : None 6 Click Score: 24    End of Session   Activity Tolerance: Patient tolerated treatment well Patient left: in bed;with call bell/phone within reach Nurse Communication: Mobility status PT Visit Diagnosis: Unsteadiness on feet (R26.81);Other abnormalities of gait and mobility (R26.89);History of falling (Z91.81)    Time: 6389-3734 PT Time Calculation (min) (ACUTE ONLY): 19 min   Charges:   PT Evaluation $PT Eval Low Complexity: War, PT  Acute Rehabilitation Services Pager [phone] Office [phone]   Colletta [place] 08/23/2021, 12:55 PM

## 2021-08-23 NOTE — Anesthesia Postprocedure Evaluation (Signed)
Anesthesia Post Note  Patient: Chad Santos  Procedure(s) Performed: ESOPHAGOGASTRODUODENOSCOPY (EGD) WITH PROPOFOL COLONOSCOPY WITH PROPOFOL BIOPSY     Patient location during evaluation: PACU Anesthesia Type: MAC Level of consciousness: awake and alert, patient cooperative and oriented Pain management: pain level controlled Vital Signs Assessment: post-procedure vital signs reviewed and stable Respiratory status: spontaneous breathing, nonlabored ventilation and respiratory function stable Cardiovascular status: stable and blood pressure returned to baseline Postop Assessment: no apparent nausea or vomiting and able to ambulate Anesthetic complications: no   No notable events documented.  Last Vitals:  Vitals:   08/23/21 1108 08/23/21 1123  BP: (!) 150/88 (!) 164/88  Pulse: 68 64  Resp: 19 14  Temp:  37.1 C  SpO2: 100% 100%    Last Pain:  Vitals:   08/23/21 1123  TempSrc:   PainSc: 0-No pain                 Delphin Funes,E. Sadhana Frater

## 2021-08-23 NOTE — Anesthesia Preprocedure Evaluation (Signed)
Anesthesia Evaluation  Patient identified by MRN, date of birth, ID band Patient awake    Reviewed: Allergy & Precautions, NPO status , Patient's Chart, lab work & pertinent test results  History of Anesthesia Complications Negative for: history of anesthetic complications  Airway Mallampati: I  TM Distance: >3 FB Neck ROM: Full    Dental  (+) Poor Dentition, Dental Advisory Given, Missing   Pulmonary Current Smoker and Patient abstained from smoking.,    breath sounds clear to auscultation       Cardiovascular hypertension, Pt. on medications (-) angina Rhythm:Regular Rate:Normal     Neuro/Psych    GI/Hepatic Neg liver ROS, GI bleed   Endo/Other  negative endocrine ROS  Renal/GU Renal InsufficiencyRenal disease     Musculoskeletal   Abdominal   Peds  Hematology  (+) Blood dyscrasia (Hb 8.2), anemia ,   Anesthesia Other Findings   Reproductive/Obstetrics                             Anesthesia Physical Anesthesia Plan  ASA: 2  Anesthesia Plan: MAC   Post-op Pain Management: Minimal or no pain anticipated   Induction:   PONV Risk Score and Plan: 0  Airway Management Planned: Natural Airway and Nasal Cannula  Additional Equipment: None  Intra-op Plan:   Post-operative Plan:   Informed Consent: I have reviewed the patients History and Physical, chart, labs and discussed the procedure including the risks, benefits and alternatives for the proposed anesthesia with the patient or authorized representative who has indicated his/her understanding and acceptance.     Dental advisory given  Plan Discussed with: CRNA and Surgeon  Anesthesia Plan Comments:         Anesthesia Quick Evaluation

## 2021-08-23 NOTE — TOC Transition Note (Signed)
Transition of Care [facility]) - CM/SW Discharge Note   Patient Details  Name: Chad Santos MRN: [id number] Date of Birth: 12/21/1960  [facility] CM/SW Contact:  Tom-Johnson, Renea Ee, RN Phone Number: 08/23/2021, 3:23 PM   Clinical Narrative:     Patient is scheduled for discharge today. No recommendations from PT/OT noted. Sister to transport at discharge. No further TOC needs noted.  Final next level of care: Home/Self Care Barriers to Discharge: Barriers Resolved   Patient Goals and CMS Choice Patient states their goals for this hospitalization and ongoing recovery are:: To return home. CMS Medicare.gov Compare Post Acute Care list provided to:: Patient Choice offered to / list presented to : NA  Discharge Placement                Patient to be transferred to facility by: Sister      Discharge Plan and Services In-house Referral: PCP / [facility], Financial Counselor Discharge Planning Services: CM Consult            DME Arranged: N/A DME Agency: NA       HH Arranged: NA HH Agency: NA        Social Determinants of Health (SDOH) Interventions     Readmission Risk Interventions Readmission Risk Prevention Plan 08/22/2021  Transportation Screening Complete  PCP or Specialist Appt within 5-7 Days Complete  Home Care Screening Complete  Medication Review (RN CM) Complete  Some recent data might be hidden

## 2021-08-23 NOTE — Transfer of Care (Signed)
Immediate Anesthesia Transfer of Care Note  Patient: Chad Santos  Procedure(s) Performed: ESOPHAGOGASTRODUODENOSCOPY (EGD) WITH PROPOFOL COLONOSCOPY WITH PROPOFOL BIOPSY  Patient Location: PACU  Anesthesia Type:MAC  Level of Consciousness: drowsy and patient cooperative  Airway & Oxygen Therapy: Patient Spontanous Breathing  Post-op Assessment: Report given to RN and Post -op Vital signs reviewed and stable  Post vital signs: Reviewed and stable  Last Vitals:  Vitals Value Taken Time  BP 144/84 08/23/21 1053  Temp    Pulse 65 08/23/21 1053  Resp 15 08/23/21 1053  SpO2 100 % 08/23/21 1053  Vitals shown include unvalidated device data.  Last Pain:  Vitals:   08/23/21 0907  TempSrc: Temporal  PainSc: 0-No pain      Patients Stated Pain Goal: 0 (17/61/60 7371)  Complications: No notable events documented.

## 2021-08-23 NOTE — Op Note (Signed)
[facility] Patient Name: Chad Santos Procedure Date : 08/23/2021 MRN: [id number] Attending MD: Georgian Co ,  Date of Birth: 05-Aug-1960 CSN: [id number] Age: 61 Admit Type: Inpatient Procedure:                Colonoscopy Indications:              Iron deficiency anemia Providers:                Adline Mango" Carmin Richmond, RN,                            Benetta Spar, Technician Referring MD:             Hospitalist team Medicines:                Monitored Anesthesia Care Complications:            No immediate complications. Estimated Blood Loss:     Estimated blood loss was minimal. Procedure:                Pre-Anesthesia Assessment:                           - Prior to the procedure, a History and Physical                            was performed, and patient medications and                            allergies were reviewed. The patient's tolerance of                            previous anesthesia was also reviewed. The risks                            and benefits of the procedure and the sedation                            options and risks were discussed with the patient.                            All questions were answered, and informed consent                            was obtained. Prior Anticoagulants: The patient has                            taken no previous anticoagulant or antiplatelet                            agents. ASA Grade Assessment: II - A patient with                            mild systemic disease. After reviewing the risks  and benefits, the patient was deemed in                            satisfactory condition to undergo the procedure.                           After obtaining informed consent, the colonoscope                            was passed under direct vision. Throughout the                            procedure, the patient's blood pressure, pulse, and                             oxygen saturations were monitored continuously. The                            CF-HQ190L ([id number]) Olympus colonoscope was                            introduced through the anus and advanced to the the                            terminal ileum. The colonoscopy was performed                            without difficulty. The patient tolerated the                            procedure well. The quality of the bowel                            preparation was adequate. The terminal ileum,                            ileocecal valve, appendiceal orifice, and rectum                            were photographed. Scope In: 10:22:24 AM Scope Out: 10:47:43 AM Scope Withdrawal Time: 0 hours 16 minutes 34 seconds  Total Procedure Duration: 0 hours 25 minutes 19 seconds  Findings:      The terminal ileum appeared normal.      A 10 mm polyp was found in the ascending colon. The polyp was sessile.       The polyp was removed with a cold snare. Resection and retrieval were       complete.      A few small-mouthed diverticula were found in the sigmoid colon.      Non-bleeding internal hemorrhoids were found during retroflexion. Impression:               - The examined portion of the ileum was normal.                           - One 10  mm polyp in the ascending colon, removed                            with a cold snare. Resected and retrieved.                           - Diverticulosis in the sigmoid colon.                           - Non-bleeding internal hemorrhoids. Recommendation:           - Return patient to hospital ward for ongoing care.                           - It is suspected that the patient's anemia is due                            to gastric ulcers and a Mallory-Weiss tear.                           - Await pathology results.                           - The findings and recommendations were discussed                            with the patient. Procedure Code(s):        ---  Professional ---                           [phone], Colonoscopy, flexible; with removal of                            tumor(s), polyp(s), or other lesion(s) by snare                            technique Diagnosis Code(s):        --- Professional ---                           K64.8, Other hemorrhoids                           K63.5, Polyp of colon                           D50.9, Iron deficiency anemia, unspecified                           K57.30, Diverticulosis of large intestine without                            perforation or abscess without bleeding CPT copyright 2019 American Medical Association. All rights reserved. The codes documented in this report are preliminary and upon coder review may  be revised to meet current compliance requirements. Chad Santos "Chad Santos,  08/23/2021 11:05:21 AM Number of Addenda: 0

## 2021-08-23 NOTE — Progress Notes (Signed)
PT Cancellation Note  Patient Details Name: Chad Santos MRN: [id number] DOB: Jun 09, 1961   Cancelled Treatment:    Reason Eval/Treat Not Completed: Patient at procedure or test/unavailable  Will follow up later today as time allows;  Otherwise, will follow up for PT tomorrow;   Thank you,  Roney Marion, PT  Acute Rehabilitation Services Pager [phone] Office [phone]    Colletta Maryland 08/23/2021, 9:19 AM

## 2021-08-24 LAB — SURGICAL PATHOLOGY

## 2021-08-25 ENCOUNTER — Telehealth: Payer: Self-pay

## 2021-08-25 DIAGNOSIS — K226 Gastro-esophageal laceration-hemorrhage syndrome: Secondary | ICD-10-CM

## 2021-08-25 DIAGNOSIS — K250 Acute gastric ulcer with hemorrhage: Secondary | ICD-10-CM

## 2021-08-25 NOTE — Telephone Encounter (Signed)
-----   Message from [name], MD sent at 08/23/2021 11:28 AM EST ----- Hi Rui Wordell, let's arrange for hospital follow up clinic visit with APP or Dr. Tarri Glenn in 3-4 weeks and repeat EGD in 8 weeks with Dr. Tarri Glenn to follow up healing of Mallory-Weiss tear and gastric ulcers.   Thanks, Lyndee Leo

## 2021-08-25 NOTE — Telephone Encounter (Signed)
Per Dr. Libby Maw request, pt has been scheduled for 1 mo hosp f/u on 09/19/21 @ 1110am and repeat EGD @ [place] on 10/21/21 @ 830am, arrival time 730am. Appt reminders have been mailed. Appts will reflect on AVS upon hosp discharge for pt future reference. Amb referral placed for auth purposes. Prep instructions generated for review with pt during hosp f/u appt.

## 2021-08-26 ENCOUNTER — Encounter: Payer: Self-pay | Admitting: Internal Medicine

## 2021-08-26 ENCOUNTER — Other Ambulatory Visit: Payer: Self-pay

## 2021-08-26 DIAGNOSIS — A048 Other specified bacterial intestinal infections: Secondary | ICD-10-CM

## 2021-08-26 MED ORDER — TETRACYCLINE HCL 500 MG PO CAPS: 500.0000 mg | ORAL_CAPSULE | Freq: Four times a day (QID) | ORAL | 0 refills | Status: DC

## 2021-08-26 MED ORDER — METRONIDAZOLE 250 MG PO TABS: 250.0000 mg | ORAL_TABLET | Freq: Four times a day (QID) | ORAL | 0 refills | Status: AC

## 2021-08-26 MED ORDER — BISMUTH SUBSALICYLATE 262 MG PO CHEW: 524.0000 mg | CHEWABLE_TABLET | Freq: Four times a day (QID) | ORAL | 0 refills | Status: AC

## 2021-08-26 MED ORDER — PANTOPRAZOLE SODIUM 40 MG PO TBEC: 40.0000 mg | DELAYED_RELEASE_TABLET | Freq: Two times a day (BID) | ORAL | 0 refills | Status: DC

## 2021-08-30 ENCOUNTER — Telehealth: Payer: Self-pay | Admitting: Internal Medicine

## 2021-08-30 NOTE — Telephone Encounter (Signed)
Inbound call from pharmacy,requesting that patient be switched to dicyclomine so it is cheaper than his other proscription. Pharmacist was hard to understand so I could not catch the medication. Please advise.

## 2021-09-01 ENCOUNTER — Other Ambulatory Visit: Payer: Self-pay

## 2021-09-01 DIAGNOSIS — A048 Other specified bacterial intestinal infections: Secondary | ICD-10-CM

## 2021-09-01 MED ORDER — DOXYCYCLINE HYCLATE 100 MG PO TABS: 100.0000 mg | ORAL_TABLET | Freq: Two times a day (BID) | ORAL | 0 refills | Status: AC

## 2021-09-01 NOTE — Telephone Encounter (Signed)
Tetracycline d/c'd and changed to Doxycyline per Dr. Libby Maw orders. Rx sent to pt preferred pharmacy.

## 2021-09-19 ENCOUNTER — Ambulatory Visit: Payer: Medicaid Other | Admitting: Gastroenterology

## 2021-09-26 ENCOUNTER — Inpatient Hospital Stay: Payer: Medicaid Other | Admitting: Internal Medicine

## 2021-10-12 ENCOUNTER — Ambulatory Visit (INDEPENDENT_AMBULATORY_CARE_PROVIDER_SITE_OTHER): Payer: Medicaid Other | Admitting: Gastroenterology

## 2021-10-12 ENCOUNTER — Encounter: Payer: Self-pay | Admitting: Gastroenterology

## 2021-10-12 VITALS — BP 132/96 | HR 76 | Ht 73.0 in | Wt 174.0 lb

## 2021-10-12 DIAGNOSIS — K279 Peptic ulcer, site unspecified, unspecified as acute or chronic, without hemorrhage or perforation: Secondary | ICD-10-CM

## 2021-10-12 DIAGNOSIS — D509 Iron deficiency anemia, unspecified: Secondary | ICD-10-CM

## 2021-10-12 DIAGNOSIS — B9681 Helicobacter pylori [H. pylori] as the cause of diseases classified elsewhere: Secondary | ICD-10-CM | POA: Diagnosis not present

## 2021-10-12 DIAGNOSIS — G4700 Insomnia, unspecified: Secondary | ICD-10-CM | POA: Diagnosis not present

## 2021-10-12 MED ORDER — METRONIDAZOLE 250 MG PO TABS: 250.0000 mg | ORAL_TABLET | Freq: Four times a day (QID) | ORAL | 0 refills | Status: AC

## 2021-10-12 MED ORDER — BISMUTH 262 MG PO CHEW: 2.0000 | CHEWABLE_TABLET | Freq: Four times a day (QID) | ORAL | 0 refills | Status: AC

## 2021-10-12 MED ORDER — DOXYCYCLINE HYCLATE 100 MG PO CAPS
100.0000 mg | ORAL_CAPSULE | Freq: Two times a day (BID) | ORAL | 0 refills | Status: DC
Start: 2021-10-12 — End: 2021-12-16

## 2021-10-12 MED ORDER — PANTOPRAZOLE SODIUM 40 MG PO TBEC: 40.0000 mg | DELAYED_RELEASE_TABLET | Freq: Two times a day (BID) | ORAL | 3 refills | Status: DC

## 2021-10-12 NOTE — Progress Notes (Signed)
? ?Referring Provider: No ref. provider found ?Primary Care Physician:  Pcp, No ? ? ?Chief complaint: Hospital follow-up ? ? ?IMPRESSION:  ?H. Pylori-associated peptic ulcer disease.  Did not complete H pylori treatment. Will prescribe now and plan follow-up EGD 8 weeks later.  ? ?Iron deficiency anemia. Repeat labs today.  ? ?History of colon polyps.  10 mm tubular adenoma removed on colonoscopy 08/23/2021.  Surveillance recommended in 3 years. ? ?PLAN: ?- CBC, iron panel ?- Tetracycline 500 mg QID x 14 days ?- Flagyl 250 mg QID x 14 days ?- Bismuth subsalicylate 350 mg QID x 14 days ?- Resume pantoprazole 40 mg BID until the time of endoscopy ?- Avoid all NSAIDs ?- EGD in 8 weeks ?- Colonoscopy 07/2024 ?- Referral to establish PCP to discuss evaluation and treatment of insomnia ? ? ?HPI: Chad Santos is a 61 y.o. male who returns in hospital follow-up.  He was hospitalized 08/21/2021 through 08/23/2021 for symptomatic anemia presenting with chest pain, dyspnea on exertion, and black stools.  He had an upper endoscopy and colonoscopy performed 08/23/2021 to evaluate iron deficiency anemia. ? ?EGD with Dr. Lorenso Courier 08/23/2021 showed a medium sized hiatal hernia, a 10 mm nonbleeding Mallory-Weiss tear, and multiple cratered gastric ulcers in the antrum.  Gastric biopsies showed chronic active gastritis with intestinal metaplasia and H. pylori.  Repeat endoscopy recommended in 2 months to perform healing. H pylori treated was recommended, however, he was not aware of the recommendation and did not pick the antibiotics up from the pharmacy.  ? ?Colonoscopy with Dr. Lorenso Courier 08/23/2021 showed a 10 mm ascending colon tubular adenoma, sigmoid diverticulosis, and internal hemorrhoids. ? ?He returns today feeling well.  He has no specific GI complaints or concerns.  No overt bleeding, He does report insomnia. His discharge instructions mention pantoprazole, but he has not been taking these medications.  ? ?He was previously using  Aleve for leg pain but it has been over 2 months since his last dose.  ? ?Most recent labs from 08/23/2021 show hemoglobin of 8.2, MCV 94.2, RDW 14.8, platelets 160. He has not seen a doctor or had labs performed since his hospitalization.  ? ?Past Medical History:  ?Diagnosis Date  ? Hypertension   ? ? ?Past Surgical History:  ?Procedure Laterality Date  ? BIOPSY  08/23/2021  ? Procedure: BIOPSY;  Surgeon: [name], MD;  Location: [facility] ENDOSCOPY;  Service: Gastroenterology;;  ? COLONOSCOPY WITH PROPOFOL N/A 08/23/2021  ? Procedure: COLONOSCOPY WITH PROPOFOL;  Surgeon: [name], MD;  Location: [place];  Service: Gastroenterology;  Laterality: N/A;  ? ESOPHAGOGASTRODUODENOSCOPY (EGD) WITH PROPOFOL N/A 08/23/2021  ? Procedure: ESOPHAGOGASTRODUODENOSCOPY (EGD) WITH PROPOFOL;  Surgeon: [name], MD;  Location: [place];  Service: Gastroenterology;  Laterality: N/A;  ? HAND SURGERY Right   ? POLYPECTOMY  08/23/2021  ? Procedure: POLYPECTOMY;  Surgeon: [name], MD;  Location: [place];  Service: Gastroenterology;;  ? ? ?Current Outpatient Medications  ?Medication Sig Dispense Refill  ? losartan (COZAAR) 50 MG tablet Take 1 tablet (50 mg total) by mouth daily. (Patient not taking: Reported on 10/12/2021) 30 tablet 2  ? ?No current facility-administered medications for this visit.  ? ? ?Allergies as of 10/12/2021  ? (No Known Allergies)  ? ? ?Family History  ?Family history unknown: Yes  ? ? ?Social History  ? ?Socioeconomic History  ? Marital status: Single  ?  Spouse name: Not on file  ? Number of children: Not on file  ?  Years of education: Not on file  ? Highest education level: Not on file  ?Occupational History  ? Occupation: unemployed  ?Tobacco Use  ? Smoking status: Every Day  ?  Packs/day: 1.00  ?  Years: 37.00  ?  Pack years: 37.00  ?  Types: Cigarettes  ? Smokeless tobacco: Never  ? Tobacco comments:  ?  Reports <1ppd  ?Vaping Use  ? Vaping Use: Never used  ?Substance and Sexual  Activity  ? Alcohol use: Yes  ?  Comment: occasionally,6 pack/month  ? Drug use: Never  ? Sexual activity: Not on file  ?Other Topics Concern  ? Not on file  ?Social History Narrative  ? Not on file  ? ?Social Determinants of Health  ? ?Financial Resource Strain: Not on file  ?Food Insecurity: Not on file  ?Transportation Needs: Not on file  ?Physical Activity: Not on file  ?Stress: Not on file  ?Social Connections: Not on file  ?Intimate Partner Violence: Not on file  ? ? ?Review of Systems: ?12 system ROS is negative except as noted above.  ? ?Physical Exam: ?General:   Alert,  well-nourished, pleasant and cooperative in NAD ?Head:  Normocephalic and atraumatic. ?Eyes:  Sclera clear, no icterus.   Conjunctiva pink. ?Ears:  Normal auditory acuity. ?Nose:  No deformity, discharge,  or lesions. ?Mouth:  No deformity or lesions.   ?Neck:  Supple; no masses or thyromegaly. ?Lungs:  Clear throughout to auscultation.   No wheezes. ?Heart:  Regular rate and rhythm; no murmurs. ?Abdomen:  Soft, nontender, nondistended, normal bowel sounds, no rebound or guarding. No hepatosplenomegaly.   ?Rectal:  Deferred  ?Msk:  Symmetrical. No boney deformities ?LAD: No inguinal or umbilical LAD ?Extremities:  No clubbing or edema. ?Neurologic:  Alert and  oriented x4;  grossly nonfocal ?Skin:  Intact without significant lesions or rashes. ?Psych:  Alert and cooperative. Normal mood and affect. ? ? ? ?Yanilen Adamik L. Tarri Glenn, MD, MPH ?10/12/2021, 1:33 PM ? ? ? ?  ?

## 2021-10-12 NOTE — Patient Instructions (Addendum)
It was my pleasure to provide care to you today. Based on our discussion, I am providing you with my recommendations below: ? ?RECOMMENDATION(S):  ? ?PRESCRIPTION MEDICATION(S):  ? ?We have sent the following medication(s) to your pharmacy: ? ?Pantoprazole 40 mg twice daily 30-60 minutes before breakfast and dinner.  ?Doxycyline 100 mg twice daily for 14 days. ?Flagyl 250 mg four times daily for 14 days. ?Pepto Bismol 2 tablet four times daily for 14 days. ? ?NOTE: If your medication(s) requires a PRIOR AUTHORIZATION, we will receive notification from your pharmacy. Once received, the process to submit for approval may take up to 7-10 business days. You will be contacted about any denials we have received from your insurance company as well as alternatives recommended by your provider. ? ?REFERRAL: ? ?A referral, your demographics, a copy of your insurance card and your records will be sent to [place] [facility]. You will receive a call from their office regarding the date, time and location of your appointment. ? ? ?FOLLOW UP: ? ?After your procedure, you will receive a call from my office staff regarding my recommendation for follow up. ? ?BMI: ? ?If you are age 62 or older, your body mass index should be between 23-30. Your Body mass index is 22.96 kg/m?[name] If this is out of the aforementioned range listed, please consider follow up with your Primary Care Provider. ? ?If you are age 42 or younger, your body mass index should be between 19-25. Your Body mass index is 22.96 kg/m?[name] If this is out of the aformentioned range listed, please consider follow up with your Primary Care Provider.  ? ?MY CHART: ? ?The [place] [facility] providers would like to encourage you to use [facility] to communicate with providers for non-urgent requests or questions.  Due to long hold times on the telephone, sending your provider a message by [facility] may be a faster and more efficient way to get a response.  Please allow 48  business hours for a response.  Please remember that this is for non-urgent requests.  ? ?Thank you for trusting me with your gastrointestinal care!   ? ?Thornton Park, MD, MPH ? ?

## 2021-10-21 ENCOUNTER — Encounter: Payer: Medicaid Other | Admitting: Gastroenterology

## 2021-11-01 ENCOUNTER — Encounter: Payer: Self-pay | Admitting: Gastroenterology

## 2021-12-09 ENCOUNTER — Other Ambulatory Visit (INDEPENDENT_AMBULATORY_CARE_PROVIDER_SITE_OTHER): Payer: Medicaid Other

## 2021-12-09 ENCOUNTER — Encounter: Payer: Self-pay | Admitting: Gastroenterology

## 2021-12-09 ENCOUNTER — Other Ambulatory Visit: Payer: Self-pay

## 2021-12-09 ENCOUNTER — Ambulatory Visit (AMBULATORY_SURGERY_CENTER): Payer: Medicaid Other | Admitting: Gastroenterology

## 2021-12-09 VITALS — BP 168/96 | HR 61 | Temp 97.8°F | Resp 20 | Ht 73.0 in | Wt 174.0 lb

## 2021-12-09 DIAGNOSIS — K2270 Barrett's esophagus without dysplasia: Secondary | ICD-10-CM | POA: Diagnosis not present

## 2021-12-09 DIAGNOSIS — D509 Iron deficiency anemia, unspecified: Secondary | ICD-10-CM | POA: Diagnosis not present

## 2021-12-09 DIAGNOSIS — K2100 Gastro-esophageal reflux disease with esophagitis, without bleeding: Secondary | ICD-10-CM | POA: Diagnosis not present

## 2021-12-09 DIAGNOSIS — B9681 Helicobacter pylori [H. pylori] as the cause of diseases classified elsewhere: Secondary | ICD-10-CM

## 2021-12-09 DIAGNOSIS — K2970 Gastritis, unspecified, without bleeding: Secondary | ICD-10-CM | POA: Diagnosis not present

## 2021-12-09 LAB — CBC
HCT: 42 % (ref 39.0–52.0)
Hemoglobin: 13.9 g/dL (ref 13.0–17.0)
MCHC: 33.1 g/dL (ref 30.0–36.0)
MCV: 91.5 fl (ref 78.0–100.0)
Platelets: 213 10*3/uL (ref 150.0–400.0)
RBC: 4.59 Mil/uL (ref 4.22–5.81)
RDW: 14 % (ref 11.5–15.5)
WBC: 5.9 10*3/uL (ref 4.0–10.5)

## 2021-12-09 MED ORDER — SODIUM CHLORIDE 0.9 % IV SOLN: 500.0000 mL | Freq: Once | INTRAVENOUS | Status: DC

## 2021-12-09 NOTE — Patient Instructions (Addendum)
HANDOUT ON ESOPHAGITIS & GASTRITIS GIVEN TO YOU TODAY  AWAIT PATHOLOGY RESULTS FROM DR BEAVERS  NO ASPIRIN ,IBUPROFEN,NAPROXEN,OR OTHER NON STEROIDAL ANTI-INFLAMMATORY DRUGS   BLOOD WORK WILL BE DONE PRIOR TO DISCHARGE TODAY  CONTINUE PANTOPRAZOLE 40 MG TWICE DAILY AS ORDERED PREVIOUSLY        YOU HAD AN ENDOSCOPIC PROCEDURE TODAY AT [place]:   Refer to the procedure report that was given to you for any specific questions about what was found during the examination.  If the procedure report does not answer your questions, please call your gastroenterologist to clarify.  If you requested that your care partner not be given the details of your procedure findings, then the procedure report has been included in a sealed envelope for you to review at your convenience later.  YOU SHOULD EXPECT: Some feelings of bloating in the abdomen. Passage of more gas than usual.  Walking can help get rid of the air that was put into your GI tract during the procedure and reduce the bloating. If you had a lower endoscopy (such as a colonoscopy or flexible sigmoidoscopy) you may notice spotting of blood in your stool or on the toilet paper. If you underwent a bowel prep for your procedure, you may not have a normal bowel movement for a few days.  Please Note:  You might notice some irritation and congestion in your nose or some drainage.  This is from the oxygen used during your procedure.  There is no need for concern and it should clear up in a day or so.  SYMPTOMS TO REPORT IMMEDIATELY:  Following lower endoscopy (colonoscopy or flexible sigmoidoscopy):  Excessive amounts of blood in the stool  Significant tenderness or worsening of abdominal pains  Swelling of the abdomen that is new, acute  Fever of 100F or higher  Following upper endoscopy (EGD)  Vomiting of blood or coffee ground material  New chest pain or pain under the shoulder blades  Painful or persistently  difficult swallowing  New shortness of breath  Fever of 100F or higher  Black, tarry-looking stools  For urgent or emergent issues, a gastroenterologist can be reached at any hour by calling [phone]. Do not use MyChart messaging for urgent concerns.    DIET:  We do recommend a small meal at first, but then you may proceed to your regular diet.  Drink plenty of fluids but you should avoid alcoholic beverages for 24 hours.  ACTIVITY:  You should plan to take it easy for the rest of today and you should NOT DRIVE or use heavy machinery until tomorrow (because of the sedation medicines used during the test).    FOLLOW UP: Our staff will call the number listed on your records 48-72 hours following your procedure to check on you and address any questions or concerns that you may have regarding the information given to you following your procedure. If we do not reach you, we will leave a message.  We will attempt to reach you two times.  During this call, we will ask if you have developed any symptoms of COVID 19. If you develop any symptoms (ie: fever, flu-like symptoms, shortness of breath, cough etc.) before then, please call [phone].  If you test positive for Covid 19 in the 2 weeks post procedure, please call and report this information to [place].    If any biopsies were taken you will be contacted by phone or by letter within the next 1-3 weeks.  Please call us at [phone] if you have not heard about the biopsies in 3 weeks.    SIGNATURES/CONFIDENTIALITY: You and/or your care partner have signed paperwork which will be entered into your electronic medical record.  These signatures attest to the fact that that the information above on your After Visit Summary has been reviewed and is understood.  Full responsibility of the confidentiality of this discharge information lies with you and/or your care-partner.

## 2021-12-09 NOTE — Progress Notes (Signed)
Pt in recovery with monitors in place, VSS. Report given to receiving RN. Bite guard was placed with pt awake to ensure comfort. No dental or soft tissue damage noted. 

## 2021-12-09 NOTE — Op Note (Signed)
[place] Patient Name: Chad Santos Procedure Date: 12/09/2021 9:49 AM MRN: [id number] Endoscopist: Thornton Park MD, MD Age: 61 Referring MD:  Date of Birth: [birth year] Gender: Male Account #: [id number] Procedure:                Upper GI endoscopy Indications:              Follow-up of Helicobacter pylori, Follow-up of                            gastric ulcers, normocytic anemia Medicines:                Monitored Anesthesia Care Procedure:                Pre-Anesthesia Assessment:                           - Prior to the procedure, a History and Physical                            was performed, and patient medications and                            allergies were reviewed. The patient's tolerance of                            previous anesthesia was also reviewed. The risks                            and benefits of the procedure and the sedation                            options and risks were discussed with the patient.                            All questions were answered, and informed consent                            was obtained. Prior Anticoagulants: The patient has                            taken no previous anticoagulant or antiplatelet                            agents. ASA Grade Assessment: II - A patient with                            mild systemic disease. After reviewing the risks                            and benefits, the patient was deemed in                            satisfactory condition to undergo the procedure.  After obtaining informed consent, the endoscope was                            passed under direct vision. Throughout the                            procedure, the patient's blood pressure, pulse, and                            oxygen saturations were monitored continuously. The                            Endoscope was introduced through the mouth, and                            advanced to the third  part of duodenum. The upper                            GI endoscopy was accomplished without difficulty.                            The patient tolerated the procedure well. Scope In: Scope Out: Findings:                 LA Grade A (one or more mucosal breaks less than 5                            mm, not extending between tops of 2 mucosal folds)                            esophagitis with no bleeding was found 36 cm from                            the incisors. Biopsies were taken with a cold                            forceps for histology. Estimated blood loss was                            minimal.                           Diffuse mild inflammation characterized by                            erythema, friability and granularity was found in                            the gastric body. No gastric ulcers present.                            Biopsies were taken from the antrum, body, and  fundus with a cold forceps for histology. Estimated                            blood loss was minimal.                           A medium-sized hiatal hernia was present.                           The examined duodenum was normal. Complications:            No immediate complications. Estimated Blood Loss:     Estimated blood loss was minimal. Estimated blood                            loss was minimal. Impression:               - LA Grade A esophagitis with no bleeding. Biopsied.                           - Gastritis. Biopsied.                           - Normal examined duodenum. Recommendation:           - Patient has a contact number available for                            emergencies. The signs and symptoms of potential                            delayed complications were discussed with the                            patient. Return to normal activities tomorrow.                            Written discharge instructions were provided to the                             patient.                           - Resume previous diet.                           - Continue present medications including                            pantoprazole 40 mg BID.                           - Await pathology results.                           - No aspirin, ibuprofen, naproxen, or other  non-steroidal anti-inflammatory drugs.                           - CBC today to follow-up on the anemia. Thornton Park MD, MD 12/09/2021 10:20:45 AM This report has been signed electronically.

## 2021-12-09 NOTE — Progress Notes (Signed)
Referring Provider: Thornton Park, MD Primary Care Physician:  Pcp, No  Indication for Procedure:  Follow-up for healing of peptic ulcer disease   IMPRESSION:  H. Pylori-associated peptic ulcer disease.  EGD recommended to documented healing.  Appropriate candidate for monitored anesthesia care  PLAN: Colonoscopy in the [facility] today   HPI: Chad Santos is a 61 y.o. male presents for screening colonoscopy.  He was hospitalized 08/21/2021 through 08/23/2021 for symptomatic anemia presenting with chest pain, dyspnea on exertion, and black stools.  He had an upper endoscopy and colonoscopy performed 08/23/2021 to evaluate iron deficiency anemia.   EGD with Dr. Lorenso Courier 08/23/2021 showed a medium sized hiatal hernia, a 10 mm nonbleeding Mallory-Weiss tear, and multiple cratered gastric ulcers in the antrum.  Gastric biopsies showed chronic active gastritis with intestinal metaplasia and H. pylori.  Repeat endoscopy recommended in 2 months to perform healing. H pylori treated was recommended, however, he was not aware of the recommendation and did not pick the antibiotics up from the pharmacy.  He was previously using Aleve for leg pain but it has been over 2 months since his last dose.    Most recent labs from 08/23/2021 show hemoglobin of 8.2, MCV 94.2, RDW 14.8, platelets 160. He has not seen a doctor or had labs performed since his hospitalization.        Past Medical History:  Diagnosis Date   Hypertension     Past Surgical History:  Procedure Laterality Date   BIOPSY  08/23/2021   Procedure: BIOPSY;  Surgeon: [name], MD;  Location: [facility] ENDOSCOPY;  Service: Gastroenterology;;   COLONOSCOPY WITH PROPOFOL N/A 08/23/2021   Procedure: COLONOSCOPY WITH PROPOFOL;  Surgeon: [name], MD;  Location: [place];  Service: Gastroenterology;  Laterality: N/A;   ESOPHAGOGASTRODUODENOSCOPY (EGD) WITH PROPOFOL N/A 08/23/2021   Procedure: ESOPHAGOGASTRODUODENOSCOPY (EGD) WITH  PROPOFOL;  Surgeon: [name], MD;  Location: [place];  Service: Gastroenterology;  Laterality: N/A;   HAND SURGERY Right    POLYPECTOMY  08/23/2021   Procedure: POLYPECTOMY;  Surgeon: [name], MD;  Location: [facility] ENDOSCOPY;  Service: Gastroenterology;;    Current Outpatient Medications  Medication Sig Dispense Refill   doxycycline (VIBRAMYCIN) 100 MG capsule Take 1 capsule (100 mg total) by mouth 2 (two) times daily. (Patient not taking: Reported on 12/09/2021) 28 capsule 0   losartan (COZAAR) 50 MG tablet Take 1 tablet (50 mg total) by mouth daily. (Patient not taking: Reported on 10/12/2021) 30 tablet 2   pantoprazole (PROTONIX) 40 MG tablet Take 1 tablet (40 mg total) by mouth 2 (two) times daily. 60 tablet 3   Current Facility-Administered Medications  Medication Dose Route Frequency Provider Last Rate Last Admin   0.9 %  sodium chloride infusion  500 mL Intravenous Once Thornton Park, MD        Allergies as of 12/09/2021   (No Known Allergies)    Family History  Problem Relation Age of Onset   Colon cancer Neg Hx    Esophageal cancer Neg Hx    Rectal cancer Neg Hx    Stomach cancer Neg Hx      Physical Exam: General:   Alert,  well-nourished, pleasant and cooperative in NAD Head:  Normocephalic and atraumatic. Eyes:  Sclera clear, no icterus.   Conjunctiva pink. Mouth:  No deformity or lesions.   Neck:  Supple; no masses or thyromegaly. Lungs:  Clear throughout to auscultation.   No wheezes. Heart:  Regular rate and rhythm; no  murmurs. Abdomen:  Soft, non-tender, nondistended, normal bowel sounds, no rebound or guarding.  Msk:  Symmetrical. No boney deformities LAD: No inguinal or umbilical LAD Extremities:  No clubbing or edema. Neurologic:  Alert and  oriented x4;  grossly nonfocal Skin:  No obvious rash or bruise. Psych:  Alert and cooperative. Normal mood and affect.     Studies/Results: No results found.    [name] L. Tarri Glenn, MD,  MPH 12/09/2021, 9:56 AM

## 2021-12-09 NOTE — Progress Notes (Signed)
Called to room to assist during endoscopic procedure.  Patient ID and intended procedure confirmed with present staff. Received instructions for my participation in the procedure from the performing physician.  

## 2021-12-12 ENCOUNTER — Telehealth: Payer: Self-pay | Admitting: *Deleted

## 2021-12-12 NOTE — Telephone Encounter (Signed)
  Follow up Call-     12/09/2021    9:03 AM  Call back number  Post procedure Call Back phone  # [phone]  Permission to leave phone message Yes     Patient questions:  Do you have a fever, pain , or abdominal swelling? No. Pain Score  0 *  Have you tolerated food without any problems? Yes.    Have you been able to return to your normal activities? Yes.    Do you have any questions about your discharge instructions: Diet   No. Medications  No. Follow up visit  No.  Do you have questions or concerns about your Care? No.  Actions: * If pain score is 4 or above: No action needed, pain <4.

## 2021-12-16 ENCOUNTER — Ambulatory Visit (HOSPITAL_COMMUNITY)
Admission: EM | Admit: 2021-12-16 | Discharge: 2021-12-16 | Disposition: A | Discharge status: Home / Self Care | Payer: Medicaid Other | Attending: Family Medicine | Admitting: Family Medicine

## 2021-12-16 ENCOUNTER — Encounter: Payer: Self-pay | Admitting: Gastroenterology

## 2021-12-16 ENCOUNTER — Encounter (HOSPITAL_COMMUNITY): Payer: Self-pay

## 2021-12-16 DIAGNOSIS — G4700 Insomnia, unspecified: Secondary | ICD-10-CM | POA: Diagnosis not present

## 2021-12-16 MED ORDER — HYDROXYZINE HCL 25 MG PO TABS: 25.0000 mg | ORAL_TABLET | Freq: Every evening | ORAL | 0 refills | Status: DC | PRN

## 2021-12-16 NOTE — ED Provider Notes (Signed)
[place]    CSN: [id number] Arrival date & time: 12/16/21  0845      History   Chief Complaint Chief Complaint  Patient presents with   Insomnia    HPI Chad Santos is a 61 y.o. male.    Insomnia  Here for trouble sleeping that he noted again last night.  He was seen here in November for similar symptoms.  He does not have any fever or upper respiratory symptoms.  No pain symptoms or  dyspnea.  He was prescribed Lunesta back in November; that is the only thing on his PMP.  He did not establish with a primary care physician after that  Past Medical History:  Diagnosis Date   Hypertension     Patient Active Problem List   Diagnosis Date Noted   Upper GI bleeding 08/21/2021   Essential hypertension 08/21/2021   Elevated troponin 08/21/2021   Bilateral leg pain 08/21/2021   Agitation 08/21/2021   Blood in the stool    Normocytic anemia     Past Surgical History:  Procedure Laterality Date   BIOPSY  08/23/2021   Procedure: BIOPSY;  Surgeon: [name], MD;  Location: [place];  Service: Gastroenterology;;   COLONOSCOPY WITH PROPOFOL N/A 08/23/2021   Procedure: COLONOSCOPY WITH PROPOFOL;  Surgeon: [name], MD;  Location: [place];  Service: Gastroenterology;  Laterality: N/A;   ESOPHAGOGASTRODUODENOSCOPY (EGD) WITH PROPOFOL N/A 08/23/2021   Procedure: ESOPHAGOGASTRODUODENOSCOPY (EGD) WITH PROPOFOL;  Surgeon: [name], MD;  Location: [place];  Service: Gastroenterology;  Laterality: N/A;   HAND SURGERY Right    POLYPECTOMY  08/23/2021   Procedure: POLYPECTOMY;  Surgeon: [name], MD;  Location[facility] ENDOSCOPY;  Service: Gastroenterology;;       Home Medications    Prior to Admission medications   Medication Sig Start Date End Date Taking? Authorizing Provider  hydrOXYzine (ATARAX) 25 MG tablet Take 1 tablet (25 mg total) by mouth at bedtime as needed (insomnia). 12/16/21  Yes Barrett Henle, MD  pantoprazole  (PROTONIX) 40 MG tablet Take 1 tablet (40 mg total) by mouth 2 (two) times daily. 10/12/21   Thornton Park, MD    Family History Family History  Problem Relation Age of Onset   Colon cancer Neg Hx    Esophageal cancer Neg Hx    Rectal cancer Neg Hx    Stomach cancer Neg Hx     Social History Social History   Tobacco Use   Smoking status: Every Day    Packs/day: 1.00    Years: 37.00    Pack years: 37.00    Types: Cigarettes   Smokeless tobacco: Never   Tobacco comments:    Reports <1ppd  Vaping Use   Vaping Use: Never used  Substance Use Topics   Alcohol use: Yes    Comment: occasionally,6 pack/month   Drug use: Never     Allergies   Patient has no known allergies.   Review of Systems Review of Systems  Psychiatric/Behavioral:  The patient has insomnia.     Physical Exam Triage Vital Signs ED Triage Vitals  Enc Vitals Group     BP 12/16/21 0940 (!) 170/107     Pulse Rate 12/16/21 0940 (!) 58     Resp 12/16/21 0940 17     Temp 12/16/21 0940 98 F (36.7 C)     Temp src --      SpO2 12/16/21 0940 96 %     Weight --  Height --      Head Circumference --      Peak Flow --      Pain Score 12/16/21 0939 0     Pain Loc --      Pain Edu? --      Excl. in [place]? --    No data found.  Updated Vital Signs BP (!) 170/107 Comment: taken twice to verify.  Pulse (!) 58   Temp 98 F (36.7 C)   Resp 17   SpO2 96%   Visual Acuity Right Eye Distance:   Left Eye Distance:   Bilateral Distance:    Right Eye Near:   Left Eye Near:    Bilateral Near:     Physical Exam Vitals reviewed.  Constitutional:      General: He is not in acute distress.    Appearance: He is not ill-appearing, toxic-appearing or diaphoretic.  HENT:     Nose: Nose normal.     Mouth/Throat:     Mouth: Mucous membranes are moist.  Eyes:     Extraocular Movements: Extraocular movements intact.     Conjunctiva/sclera: Conjunctivae normal.     Pupils: Pupils are equal, round,  and reactive to light.  Cardiovascular:     Rate and Rhythm: Normal rate and regular rhythm.     Heart sounds: No murmur heard. Pulmonary:     Effort: Pulmonary effort is normal.     Breath sounds: Normal breath sounds.  Musculoskeletal:     Cervical back: Neck supple.  Lymphadenopathy:     Cervical: No cervical adenopathy.  Skin:    Coloration: Skin is not jaundiced or pale.  Neurological:     Mental Status: He is alert and oriented to person, place, and time.  Psychiatric:        Behavior: Behavior normal.     UC Treatments / Results  Labs (all labs ordered are listed, but only abnormal results are displayed) Labs Reviewed - No data to display  EKG   Radiology No results found.  Procedures Procedures (including critical care time)  Medications Ordered in UC Medications - No data to display  Initial Impression / Assessment and Plan / UC Course  I have reviewed the triage vital signs and the nursing notes.  Pertinent labs & imaging results that were available during my care of the patient were reviewed by me and considered in my medical decision making (see chart for details).     I will send in a few hydroxyzine for him to take as needed.  I discussed with him how establishing with a primary care office could help him have a prescription for something for his insomnia to use as needed. Final Clinical Impressions(s) / UC Diagnoses   Final diagnoses:  Insomnia, unspecified type     Discharge Instructions      You can take hydroxyzine 25 mg--1 tablet every evening as needed for insomnia.     ED Prescriptions     Medication Sig Dispense Auth. Provider   hydrOXYzine (ATARAX) 25 MG tablet Take 1 tablet (25 mg total) by mouth at bedtime as needed (insomnia). 10 tablet Windy Carina Gwenlyn Perking, MD      I have reviewed the PDMP during this encounter.   Barrett Henle, MD 12/16/21 1017

## 2021-12-16 NOTE — Discharge Instructions (Addendum)
You can take hydroxyzine 25 mg--1 tablet every evening as needed for insomnia.

## 2021-12-16 NOTE — ED Triage Notes (Signed)
Pt presents with insomnia that started last night. Denies any other symptoms. Reports trying melatonin with no relief.

## 2021-12-28 ENCOUNTER — Emergency Department (HOSPITAL_COMMUNITY): Payer: Medicaid Other

## 2021-12-28 ENCOUNTER — Inpatient Hospital Stay (HOSPITAL_COMMUNITY): Payer: Medicaid Other

## 2021-12-28 ENCOUNTER — Encounter (HOSPITAL_COMMUNITY): Payer: Self-pay | Admitting: Internal Medicine

## 2021-12-28 ENCOUNTER — Inpatient Hospital Stay (HOSPITAL_COMMUNITY)
Admission: EM | Admit: 2021-12-28 | Discharge: 2022-01-01 | DRG: 065 | Disposition: A | Discharge status: Rehabilitation Facility | Payer: Medicaid Other | Attending: Family Medicine | Admitting: Family Medicine

## 2021-12-28 DIAGNOSIS — Z8719 Personal history of other diseases of the digestive system: Secondary | ICD-10-CM | POA: Diagnosis not present

## 2021-12-28 DIAGNOSIS — I6381 Other cerebral infarction due to occlusion or stenosis of small artery: Secondary | ICD-10-CM | POA: Diagnosis present

## 2021-12-28 DIAGNOSIS — K279 Peptic ulcer, site unspecified, unspecified as acute or chronic, without hemorrhage or perforation: Secondary | ICD-10-CM | POA: Diagnosis not present

## 2021-12-28 DIAGNOSIS — M545 Low back pain, unspecified: Secondary | ICD-10-CM | POA: Diagnosis not present

## 2021-12-28 DIAGNOSIS — I10 Essential (primary) hypertension: Secondary | ICD-10-CM | POA: Diagnosis not present

## 2021-12-28 DIAGNOSIS — W19XXXA Unspecified fall, initial encounter: Secondary | ICD-10-CM | POA: Diagnosis present

## 2021-12-28 DIAGNOSIS — Z8673 Personal history of transient ischemic attack (TIA), and cerebral infarction without residual deficits: Secondary | ICD-10-CM

## 2021-12-28 DIAGNOSIS — G8194 Hemiplegia, unspecified affecting left nondominant side: Secondary | ICD-10-CM | POA: Diagnosis present

## 2021-12-28 DIAGNOSIS — R471 Dysarthria and anarthria: Secondary | ICD-10-CM | POA: Diagnosis present

## 2021-12-28 DIAGNOSIS — I639 Cerebral infarction, unspecified: Secondary | ICD-10-CM | POA: Diagnosis present

## 2021-12-28 DIAGNOSIS — Z20822 Contact with and (suspected) exposure to covid-19: Secondary | ICD-10-CM | POA: Diagnosis present

## 2021-12-28 DIAGNOSIS — M160 Bilateral primary osteoarthritis of hip: Secondary | ICD-10-CM | POA: Diagnosis present

## 2021-12-28 DIAGNOSIS — E785 Hyperlipidemia, unspecified: Secondary | ICD-10-CM | POA: Diagnosis present

## 2021-12-28 DIAGNOSIS — Z79899 Other long term (current) drug therapy: Secondary | ICD-10-CM | POA: Diagnosis not present

## 2021-12-28 DIAGNOSIS — Y92002 Bathroom of unspecified non-institutional (private) residence single-family (private) house as the place of occurrence of the external cause: Secondary | ICD-10-CM | POA: Diagnosis not present

## 2021-12-28 DIAGNOSIS — K5900 Constipation, unspecified: Secondary | ICD-10-CM | POA: Diagnosis not present

## 2021-12-28 DIAGNOSIS — G4700 Insomnia, unspecified: Secondary | ICD-10-CM | POA: Diagnosis not present

## 2021-12-28 DIAGNOSIS — I69354 Hemiplegia and hemiparesis following cerebral infarction affecting left non-dominant side: Secondary | ICD-10-CM | POA: Diagnosis not present

## 2021-12-28 DIAGNOSIS — S51812A Laceration without foreign body of left forearm, initial encounter: Secondary | ICD-10-CM | POA: Diagnosis present

## 2021-12-28 DIAGNOSIS — R1310 Dysphagia, unspecified: Secondary | ICD-10-CM | POA: Diagnosis present

## 2021-12-28 DIAGNOSIS — R29810 Facial weakness: Secondary | ICD-10-CM | POA: Diagnosis present

## 2021-12-28 DIAGNOSIS — R29704 NIHSS score 4: Secondary | ICD-10-CM | POA: Diagnosis present

## 2021-12-28 DIAGNOSIS — N183 Chronic kidney disease, stage 3 unspecified: Secondary | ICD-10-CM | POA: Diagnosis present

## 2021-12-28 DIAGNOSIS — Z8711 Personal history of peptic ulcer disease: Secondary | ICD-10-CM

## 2021-12-28 DIAGNOSIS — R1312 Dysphagia, oropharyngeal phase: Secondary | ICD-10-CM | POA: Diagnosis not present

## 2021-12-28 DIAGNOSIS — N189 Chronic kidney disease, unspecified: Secondary | ICD-10-CM | POA: Diagnosis not present

## 2021-12-28 DIAGNOSIS — I6389 Other cerebral infarction: Secondary | ICD-10-CM | POA: Diagnosis not present

## 2021-12-28 DIAGNOSIS — F1210 Cannabis abuse, uncomplicated: Secondary | ICD-10-CM | POA: Diagnosis present

## 2021-12-28 DIAGNOSIS — I129 Hypertensive chronic kidney disease with stage 1 through stage 4 chronic kidney disease, or unspecified chronic kidney disease: Secondary | ICD-10-CM | POA: Diagnosis present

## 2021-12-28 DIAGNOSIS — F17210 Nicotine dependence, cigarettes, uncomplicated: Secondary | ICD-10-CM | POA: Diagnosis present

## 2021-12-28 DIAGNOSIS — I69391 Dysphagia following cerebral infarction: Secondary | ICD-10-CM | POA: Diagnosis not present

## 2021-12-28 LAB — CBC
HCT: 42.1 % (ref 39.0–52.0)
Hemoglobin: 14 g/dL (ref 13.0–17.0)
MCH: 30.3 pg (ref 26.0–34.0)
MCHC: 33.3 g/dL (ref 30.0–36.0)
MCV: 91.1 fL (ref 80.0–100.0)
Platelets: 236 10*3/uL (ref 150–400)
RBC: 4.62 MIL/uL (ref 4.22–5.81)
RDW: 13.7 % (ref 11.5–15.5)
WBC: 8.1 10*3/uL (ref 4.0–10.5)
nRBC: 0 % (ref 0.0–0.2)

## 2021-12-28 LAB — DIFFERENTIAL
Abs Immature Granulocytes: 0.02 10*3/uL (ref 0.00–0.07)
Basophils Absolute: 0 10*3/uL (ref 0.0–0.1)
Basophils Relative: 1 %
Eosinophils Absolute: 0.1 10*3/uL (ref 0.0–0.5)
Eosinophils Relative: 1 %
Immature Granulocytes: 0 %
Lymphocytes Relative: 27 %
Lymphs Abs: 2.2 10*3/uL (ref 0.7–4.0)
Monocytes Absolute: 0.6 10*3/uL (ref 0.1–1.0)
Monocytes Relative: 7 %
Neutro Abs: 5.2 10*3/uL (ref 1.7–7.7)
Neutrophils Relative %: 64 %

## 2021-12-28 LAB — HEMOGLOBIN A1C
Hgb A1c MFr Bld: 6.1 % — ABNORMAL HIGH (ref 4.8–5.6)
Mean Plasma Glucose: 128.37 mg/dL

## 2021-12-28 LAB — I-STAT CHEM 8, ED
BUN: 19 mg/dL (ref 8–23)
Calcium, Ion: 1.07 mmol/L — ABNORMAL LOW (ref 1.15–1.40)
Chloride: 101 mmol/L (ref 98–111)
Creatinine, Ser: 1.3 mg/dL — ABNORMAL HIGH (ref 0.61–1.24)
Glucose, Bld: 86 mg/dL (ref 70–99)
HCT: 43 % (ref 39.0–52.0)
Hemoglobin: 14.6 g/dL (ref 13.0–17.0)
Potassium: 4.1 mmol/L (ref 3.5–5.1)
Sodium: 139 mmol/L (ref 135–145)
TCO2: 27 mmol/L (ref 22–32)

## 2021-12-28 LAB — COMPREHENSIVE METABOLIC PANEL
ALT: 9 U/L (ref 0–44)
AST: 18 U/L (ref 15–41)
Albumin: 3.6 g/dL (ref 3.5–5.0)
Alkaline Phosphatase: 94 U/L (ref 38–126)
Anion gap: 10 (ref 5–15)
BUN: 16 mg/dL (ref 8–23)
CO2: 26 mmol/L (ref 22–32)
Calcium: 9.5 mg/dL (ref 8.9–10.3)
Chloride: 103 mmol/L (ref 98–111)
Creatinine, Ser: 1.35 mg/dL — ABNORMAL HIGH (ref 0.61–1.24)
GFR, Estimated: 59 mL/min — ABNORMAL LOW (ref >60)
Glucose, Bld: 88 mg/dL (ref 70–99)
Potassium: 4.2 mmol/L (ref 3.5–5.1)
Sodium: 139 mmol/L (ref 135–145)
Total Bilirubin: 0.5 mg/dL (ref 0.3–1.2)
Total Protein: 6.8 g/dL (ref 6.5–8.1)

## 2021-12-28 LAB — LIPID PANEL
Cholesterol: 189 mg/dL (ref 0–200)
HDL: 56 mg/dL (ref >40)
LDL Cholesterol: 116 mg/dL — ABNORMAL HIGH (ref 0–99)
Total CHOL/HDL Ratio: 3.4 RATIO
Triglycerides: 86 mg/dL (ref <150)
VLDL: 17 mg/dL (ref 0–40)

## 2021-12-28 LAB — URINALYSIS, ROUTINE W REFLEX MICROSCOPIC
Bilirubin Urine: NEGATIVE
Glucose, UA: NEGATIVE mg/dL
Hgb urine dipstick: NEGATIVE
Ketones, ur: 5 mg/dL — AB
Leukocytes,Ua: NEGATIVE
Nitrite: NEGATIVE
Protein, ur: NEGATIVE mg/dL
Specific Gravity, Urine: 1.028 (ref 1.005–1.030)
pH: 7 (ref 5.0–8.0)

## 2021-12-28 LAB — RAPID URINE DRUG SCREEN, HOSP PERFORMED
Amphetamines: NOT DETECTED
Barbiturates: NOT DETECTED
Benzodiazepines: NOT DETECTED
Cocaine: NOT DETECTED
Opiates: NOT DETECTED
Tetrahydrocannabinol: POSITIVE — AB

## 2021-12-28 LAB — PROTIME-INR
INR: 1 (ref 0.8–1.2)
Prothrombin Time: 12.9 seconds (ref 11.4–15.2)

## 2021-12-28 LAB — APTT: aPTT: 35 seconds (ref 24–36)

## 2021-12-28 LAB — RESP PANEL BY RT-PCR (FLU A&B, COVID) ARPGX2
Influenza A by PCR: NEGATIVE
Influenza B by PCR: NEGATIVE
SARS Coronavirus 2 by RT PCR: NEGATIVE

## 2021-12-28 LAB — ETHANOL: Alcohol, Ethyl (B): <10 mg/dL (ref <10)

## 2021-12-28 MED ORDER — ACETAMINOPHEN 160 MG/5ML PO SOLN: 650.0000 mg | ORAL | Status: DC | PRN

## 2021-12-28 MED ORDER — HYDRALAZINE HCL 20 MG/ML IJ SOLN
5.0000 mg | INTRAMUSCULAR | Status: DC | PRN
Start: 2021-12-28 — End: 2022-01-01
  Administered 2021-12-29: 5 mg via INTRAVENOUS
  Filled 2021-12-28: qty 1

## 2021-12-28 MED ORDER — ENOXAPARIN SODIUM 40 MG/0.4ML IJ SOSY
40.0000 mg | PREFILLED_SYRINGE | INTRAMUSCULAR | Status: DC
  Administered 2021-12-29 – 2021-12-31 (×4): 40 mg via SUBCUTANEOUS
  Filled 2021-12-28 (×4): qty 0.4

## 2021-12-28 MED ORDER — BACITRACIN ZINC 500 UNIT/GM EX OINT
1.0000 "application " | TOPICAL_OINTMENT | Freq: Two times a day (BID) | CUTANEOUS | Status: DC
  Administered 2021-12-29 – 2022-01-01 (×6): 1 via TOPICAL
  Filled 2021-12-28: qty 28.4
  Filled 2021-12-28: qty 0.9

## 2021-12-28 MED ORDER — ACETAMINOPHEN 325 MG PO TABS
650.0000 mg | ORAL_TABLET | ORAL | Status: DC | PRN
  Administered 2021-12-30 (×2): 650 mg via ORAL
  Filled 2021-12-28 (×2): qty 2

## 2021-12-28 MED ORDER — IOHEXOL 350 MG/ML SOLN
75.0000 mL | Freq: Once | INTRAVENOUS | Status: AC | PRN
  Administered 2021-12-28: 75 mL via INTRAVENOUS

## 2021-12-28 MED ORDER — ACETAMINOPHEN 650 MG RE SUPP: 650.0000 mg | RECTAL | Status: DC | PRN

## 2021-12-28 MED ORDER — ATORVASTATIN CALCIUM 80 MG PO TABS
80.0000 mg | ORAL_TABLET | Freq: Every day | ORAL | Status: DC
  Administered 2021-12-29 – 2021-12-31 (×4): 80 mg via ORAL
  Filled 2021-12-28: qty 1
  Filled 2021-12-28: qty 2
  Filled 2021-12-28 (×2): qty 1

## 2021-12-28 MED ORDER — PANTOPRAZOLE SODIUM 40 MG IV SOLR
40.0000 mg | INTRAVENOUS | Status: DC
  Administered 2021-12-29 – 2021-12-30 (×3): 40 mg via INTRAVENOUS
  Filled 2021-12-28 (×3): qty 10

## 2021-12-28 MED ORDER — STROKE: EARLY STAGES OF RECOVERY BOOK
Freq: Once | Status: DC
  Filled 2021-12-28: qty 1

## 2021-12-28 MED ORDER — SODIUM CHLORIDE 0.9 % IV SOLN: INTRAVENOUS | Status: AC

## 2021-12-28 NOTE — ED Notes (Signed)
Pt passed the swallow screen 

## 2021-12-28 NOTE — Consult Note (Addendum)
Neurology Consultation  Reason for Consult: left side weakness Referring Physician: Sabra Heck  CC: left side weakness  History is obtained from:Patient, sister over the phone  HPI: Chad Santos is a 61 y.o. male with a PMH of recent GI bleed presenting to the hospital today with dysarthria, left side upper and lower extremity weakness, and left facial droop. He states that at 0100 in the morning he fell in the bathroom and he called his family who arrived and found him to have slurred speech, left side weakness, and a facial droop. He states his legs just gave out. His sister states that she though his speech was slurred yesterday evening when she spoke with him as well. He does have a left arm laceration from his fall, but no other signs of overt trauma or bleeding. He denies hitting his head during his fall. He denies nausea, vomiting, diarrhea, or dizziness at this time. EMS reports hypertension and NSR en route.    LKW: 12/27/2021 1600 TNK given?: no, outside of window and recent GI bleed IR Thrombectomy? No, no LVO suspected Modified Rankin Scale: 1-No significant post stroke disability and can perform usual duties with stroke symptoms  ROS: A complete ROS was performed and is negative except as noted in the HPI.   Past Medical History:  Diagnosis Date   Hypertension     Family History  Problem Relation Age of Onset   Colon cancer Neg Hx    Esophageal cancer Neg Hx    Rectal cancer Neg Hx    Stomach cancer Neg Hx     Social History:   reports that he has been smoking cigarettes. He has a 37.00 pack-year smoking history. He has never used smokeless tobacco. He reports current alcohol use. He reports that he does not use drugs.  Medications  Current Facility-Administered Medications:    bacitracin ointment 1 application., 1 application., Topical, BID, Noemi Chapel, MD  Current Outpatient Medications:    hydrOXYzine (ATARAX) 25 MG tablet, Take 1 tablet (25 mg total) by  mouth at bedtime as needed (insomnia)., Disp: 10 tablet, Rfl: 0   pantoprazole (PROTONIX) 40 MG tablet, Take 1 tablet (40 mg total) by mouth 2 (two) times daily., Disp: 60 tablet, Rfl: 3   Exam: Current vital signs: BP (!) 169/112   Pulse 64   Resp 18   SpO2 98%  Vital signs in last 24 hours: Pulse Rate:  [64-81] 64 (06/07 1830) Resp:  [18-23] 18 (06/07 1830) BP: (169-198)/(112-122) 169/112 (06/07 1830) SpO2:  [97 %-100 %] 98 % (06/07 1830)  GENERAL: Awake, alert, pleasant middle-aged African-American male in no acute distress Psych: Affect appropriate for situation, patient is calm and cooperative with examination Head: Normocephalic and atraumatic, without obvious abnormality EENT: Normal conjunctivae, dry mucous membranes, no OP obstruction LUNGS: Normal respiratory effort. Non-labored breathing on room air CV: Regular rate and rhythm on telemetry ABDOMEN: Soft, non-tender, non-distended Extremities: warm, well perfused, without obvious deformity  NEURO:  Mental Status: Awake, alert, and oriented to person, place, time, and situation. He is able to provide a clear and coherent history of present illness. Speech/Language: speech is dysarthric.   Naming, repetition, fluency, and comprehension intact without aphasia. No neglect is noted Cranial Nerves:  II: PERRL 30m/brisk. visual fields full.  III, IV, VI: EOMI. Lid elevation symmetric and full.  V: Sensation is intact to light touch and symmetrical to face. Blinks to threat. Moves jaw back and forth.  VII: Left facial droop VIII: Hearing intact to  voice IX, X: Palate elevation is symmetric. Phonation normal.  XI: Normal sternocleidomastoid and trapezius muscle strength XII: Tongue is minimally deviated to the left Motor: Tone is normal. Bulk is normal.  RUE 5/5 LUE 4+/5 with subtle drift RLE 5/5 LLE 4/5 with drift Sensation: Intact to light touch bilaterally in all four extremities. No extinction to DSS present.   Coordination: FTN intact bilaterally. HKS intact bilaterally. No pronator drift. Alternating hand movements slowed on the left.  DTRs: 2+ throughout.  Gait: Deferred  NIHSS: 1a Level of Conscious.: 0 1b LOC Questions: 0 1c LOC Commands: 0 2 Best Gaze: 0 3 Visual: 0 4 Facial Palsy: 1 5a Motor Arm - left: 1 5b Motor Arm - Right: 0 6a Motor Leg - Left: 1 6b Motor Leg - Right: 0 7 Limb Ataxia: 0 8 Sensory: 0 9 Best Language: 0 10 Dysarthria: 1 11 Extinct. and Inatten.: 0 TOTAL: 4   Labs I have reviewed labs in epic and the results pertinent to this consultation are:  CBC    Component Value Date/Time   WBC 5.9 12/09/2021 1056   RBC 4.59 12/09/2021 1056   HGB 13.9 12/09/2021 1056   HCT 42.0 12/09/2021 1056   PLT 213.0 12/09/2021 1056   MCV 91.5 12/09/2021 1056   MCH 31.9 08/23/2021 0355   MCHC 33.1 12/09/2021 1056   RDW 14.0 12/09/2021 1056   LYMPHSABS 2.6 08/23/2021 0355   MONOABS 0.6 08/23/2021 0355   EOSABS 0.1 08/23/2021 0355   BASOSABS 0.1 08/23/2021 0355    CMP     Component Value Date/Time   NA 139 08/23/2021 0355   K 3.4 (L) 08/23/2021 0355   CL 107 08/23/2021 0355   CO2 24 08/23/2021 0355   GLUCOSE 84 08/23/2021 0355   BUN 19 08/23/2021 0355   CREATININE 1.26 (H) 08/23/2021 0355   CALCIUM 8.3 (L) 08/23/2021 0355   PROT 5.5 (L) 08/21/2021 0927   ALBUMIN 3.3 (L) 08/21/2021 0927   AST 29 08/21/2021 0927   ALT 26 08/21/2021 0927   ALKPHOS 46 08/21/2021 0927   BILITOT 0.7 08/21/2021 0927   GFRNONAA >60 08/23/2021 0355    Lipid Panel  No results found for: CHOL, TRIG, HDL, CHOLHDL, VLDL, LDLCALC, LDLDIRECT   Imaging I have reviewed the images obtained:  CT-scan of the brain-no acute abnormality.  Mild changes of chronic small vessel disease. CTA Head and Neck- pending  Assessment: 61 y.o. 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  Patient has presented outside time window for thrombolysis and clinical exam is not suggestive of an LVO  Recommendations: - HgbA1c, fasting lipid panel - MRI, MRA  of the brain without contrast - Frequent neuro checks - Echocardiogram - Carotid dopplers - Hold antiplatelets until H/H is resulted due to recent GI bleed - Risk factor modification - Telemetry monitoring - PT consult, OT consult, Speech consult - Stroke team to follow   Patient seen and examined by NP/APP with MD. MD to update note as needed.   Janine Ores, DNP, FNP-BC [facility] Pager: [phone]   STROKE MD NOTE :  I have personally obtained history,examined this patient, reviewed notes, independently viewed imaging studies, participated in medical  decision making and plan of care.ROS completed by me personally and pertinent positives fully documented  I have made any additions or clarifications directly to the above note. Agree with note above.  He presented with slurred speech and dysarthria with unclear time of onset possibly more than 24 hours and is not a candidate for thrombolysis given recent GI bleed and unclear time of onset.  Neurological exam suggest slurred speech facial weakness and mild left hemiparesis likely right brain subcortical pontine lacunar infarct.  Recommend admission for further stroke work-up and restratification.  Admit to the medical team.  Check MRI scan of the brain, CT angiogram brain and neck, echocardiogram, lipid profile and hemoglobin A1c.  Start dual antiplatelet therapy aspirin and Plavix for 3 weeks followed by aspirin alone and aggressive risk factor modification.  Physical occupational and speech therapy consults.  Long discussion with patient and his sister whom I spoke  to over the phone about the stroke evaluation and answered questions.  Discussed with Dr. Noemi Chapel, ER, MD. Greater than 50% time during this 80-minute consultation visit with spent in counseling and coordination of care, stroke and discussion about stroke evaluation and treatment and answering questions and discussion.  Antony Contras, MD Medical Director [facility] Pager: [phone] 12/28/2021 6:52 PM

## 2021-12-28 NOTE — H&P (Addendum)
History and Physical    Chad Santos GXQ:[id number] DOB: [birth year] DOA: 12/28/2021  PCP: Pcp, No  Patient coming from: Home.  Chief Complaint: Left-sided weakness.  HPI: Chad Santos is a 61 y.o. male with history of peptic ulcer disease with GI bleed, hypertension, tobacco abuse presents to the ER after patient was found to have consistently weak left-sided weakness and family found that patient was having slurred speech.  Patient states that on December 27, 2021 at around 1 AM when he woke up from sleep to go to the bathroom he fell and hit on the floor.  Since then he has been feeling weak on the left side both upper and lower extremity.  When family checked on him today they found that his speech was slurred and patient was brought to the ER.  Patient states he has not been taking any of his medications.  ED Course: In the ER patient was evaluated by neurologist.  CT angiogram of the head and neck did not show any large vessel obstruction.  On exam patient is weak on the left side and also had left facial droop.  Patient is being admitted for further stroke work-up.  EKG shows normal sinus rhythm.  Review of Systems: As per HPI, rest all negative.   Past Medical History:  Diagnosis Date   Hypertension     Past Surgical History:  Procedure Laterality Date   BIOPSY  08/23/2021   Procedure: BIOPSY;  Surgeon: [name], MD;  Location: [facility] ENDOSCOPY;  Service: Gastroenterology;;   COLONOSCOPY WITH PROPOFOL N/A 08/23/2021   Procedure: COLONOSCOPY WITH PROPOFOL;  Surgeon: [name], MD;  Location: [place];  Service: Gastroenterology;  Laterality: N/A;   ESOPHAGOGASTRODUODENOSCOPY (EGD) WITH PROPOFOL N/A 08/23/2021   Procedure: ESOPHAGOGASTRODUODENOSCOPY (EGD) WITH PROPOFOL;  Surgeon: [name], MD;  Location: [place];  Service: Gastroenterology;  Laterality: N/A;   HAND SURGERY Right    POLYPECTOMY  08/23/2021   Procedure: POLYPECTOMY;  Surgeon: [name], MD;   Location: [facility] ENDOSCOPY;  Service: Gastroenterology;;     reports that he has been smoking cigarettes. He has a 37.00 pack-year smoking history. He has never used smokeless tobacco. He reports current alcohol use. He reports that he does not use drugs.  No Known Allergies  Family History  Problem Relation Age of Onset   Colon cancer Neg Hx    Esophageal cancer Neg Hx    Rectal cancer Neg Hx    Stomach cancer Neg Hx     Prior to Admission medications   Medication Sig Start Date End Date Taking? Authorizing Provider  hydrOXYzine (ATARAX) 25 MG tablet Take 1 tablet (25 mg total) by mouth at bedtime as needed (insomnia). 12/16/21  Yes Barrett Henle, MD  pantoprazole (PROTONIX) 40 MG tablet Take 1 tablet (40 mg total) by mouth 2 (two) times daily. 10/12/21  Yes Thornton Park, MD    Physical Exam: Constitutional: Moderately built and nourished. Vitals:   12/28/21 1800 12/28/21 1815 12/28/21 1830  BP: (!) 198/114 (!) 181/122 (!) 169/112  Pulse: 81 79 64  Resp: (!) '23 20 18  '$ SpO2: 97% 100% 98%   Eyes: Anicteric no pallor. ENMT: No discharge from the ears eyes nose and mouth. Neck: No mass felt.  No neck rigidity. Respiratory: No rhonchi or crepitations. Cardiovascular: S1-S2 heard. Abdomen: Soft nontender bowel sound present. Musculoskeletal: No edema. Skin: No rash. Neurologic: Alert awake oriented to his name and place has 4 x 5 strength in the  left upper and lower extremity.  Left facial droop seen.  Pupils are equal and reacting to light.  Tongue is midline. Psychiatric: Appears normal.  Normal affect.   Labs on Admission: I have personally reviewed following labs and imaging studies  CBC: Recent Labs  Lab 12/28/21 1834 12/28/21 1846  WBC 8.1  --   NEUTROABS 5.2  --   HGB 14.0 14.6  HCT 42.1 43.0  MCV 91.1  --   PLT 236  --    Basic Metabolic Panel: Recent Labs  Lab 12/28/21 1834 12/28/21 1846  NA 139 139  K 4.2 4.1  CL 103 101  CO2 26  --   GLUCOSE  88 86  BUN 16 19  CREATININE 1.35* 1.30*  CALCIUM 9.5  --    GFR: CrCl cannot be calculated (Unknown ideal weight.). Liver Function Tests: Recent Labs  Lab 12/28/21 1834  AST 18  ALT 9  ALKPHOS 94  BILITOT 0.5  PROT 6.8  ALBUMIN 3.6   No results for input(s): LIPASE, AMYLASE in the last 168 hours. No results for input(s): AMMONIA in the last 168 hours. Coagulation Profile: Recent Labs  Lab 12/28/21 1834  INR 1.0   Cardiac Enzymes: No results for input(s): CKTOTAL, CKMB, CKMBINDEX, TROPONINI in the last 168 hours. BNP (last 3 results) No results for input(s): PROBNP in the last 8760 hours. HbA1C: Recent Labs    12/28/21 1844  HGBA1C 6.1*   CBG: No results for input(s): GLUCAP in the last 168 hours. Lipid Profile: Recent Labs    12/28/21 1844  CHOL 189  HDL 56  LDLCALC 116*  TRIG 86  CHOLHDL 3.4   Thyroid Function Tests: No results for input(s): TSH, T4TOTAL, FREET4, T3FREE, THYROIDAB in the last 72 hours. Anemia Panel: No results for input(s): VITAMINB12, FOLATE, FERRITIN, TIBC, IRON, RETICCTPCT in the last 72 hours. Urine analysis:    Component Value Date/Time   COLORURINE YELLOW 12/28/2021 2037   APPEARANCEUR CLEAR 12/28/2021 2037   LABSPEC 1.028 12/28/2021 2037   PHURINE 7.0 12/28/2021 2037   GLUCOSEU NEGATIVE 12/28/2021 2037   HGBUR NEGATIVE 12/28/2021 2037   BILIRUBINUR NEGATIVE 12/28/2021 2037   KETONESUR 5 (A) 12/28/2021 2037   PROTEINUR NEGATIVE 12/28/2021 2037   NITRITE NEGATIVE 12/28/2021 2037   LEUKOCYTESUR NEGATIVE 12/28/2021 2037   Sepsis Labs: '@LABRCNTIP'$ (procalcitonin:4,lacticidven:4) )No results found for this or any previous visit (from the past 240 hour(s)).   Radiological Exams on Admission: CT ANGIO HEAD NECK W WO CM  Result Date: 12/28/2021 CLINICAL DATA:  Acute neurologic deficit EXAM: CT HEAD WITHOUT CONTRAST CT ANGIOGRAPHY OF THE HEAD AND NECK TECHNIQUE: Contiguous axial images were obtained from the base of the skull  through the vertex without intravenous contrast. Multidetector CT imaging of the head and neck was performed using the standard protocol during bolus administration of intravenous contrast. Multiplanar CT image reconstructions and MIPs were obtained to evaluate the vascular anatomy. Carotid stenosis measurements (when applicable) are obtained utilizing NASCET criteria, using the distal internal carotid diameter as the denominator. RADIATION DOSE REDUCTION: This exam was performed according to the departmental dose-optimization program which includes automated exposure control, adjustment of the mA and/or kV according to patient size and/or use of iterative reconstruction technique. CONTRAST:  48m OMNIPAQUE IOHEXOL 350 MG/ML SOLN COMPARISON:  None Available. FINDINGS: CT HEAD FINDINGS Brain: There is no mass, hemorrhage or extra-axial collection. There is generalized atrophy without lobar predilection. There are old bilateral basal ganglia small vessel infarcts and left cerebellar infarct. There  is hypoattenuation of the periventricular white matter, most commonly indicating chronic ischemic microangiopathy. Skull: The visualized skull base, calvarium and extracranial soft tissues are normal. Sinuses/Orbits: No fluid levels or advanced mucosal thickening of the visualized paranasal sinuses. No mastoid or middle ear effusion. The orbits are normal. CTA NECK FINDINGS SKELETON: There is no bony spinal canal stenosis. No lytic or blastic lesion. OTHER NECK: Normal pharynx, larynx and major salivary glands. No cervical lymphadenopathy. Unremarkable thyroid gland. UPPER CHEST: No pneumothorax or pleural effusion. No nodules or masses. AORTIC ARCH: There is calcific atherosclerosis of the aortic arch. There is no aneurysm, dissection or hemodynamically significant stenosis of the visualized portion of the aorta. Conventional 3 vessel aortic branching pattern. The visualized proximal subclavian arteries are widely patent.  RIGHT CAROTID SYSTEM: Normal without aneurysm, dissection or stenosis. LEFT CAROTID SYSTEM: Normal without aneurysm, dissection or stenosis. VERTEBRAL ARTERIES: Codominant configuration. Both origins are clearly patent. There is no dissection, occlusion or flow-limiting stenosis to the skull base (V1-V3 segments). CTA HEAD FINDINGS POSTERIOR CIRCULATION: --Vertebral arteries: Normal V4 segments. --Inferior cerebellar arteries: Normal. --Basilar artery: Normal. --Superior cerebellar arteries: Normal. --Posterior cerebral arteries (PCA): Normal. ANTERIOR CIRCULATION: --Intracranial internal carotid arteries: Normal. --Anterior cerebral arteries (ACA): Normal. Both A1 segments are present. Patent anterior communicating artery (a-comm). --Middle cerebral arteries (MCA): Normal. VENOUS SINUSES: As permitted by contrast timing, patent. ANATOMIC VARIANTS: Fetal origin of the right posterior cerebral artery. Review of the MIP images confirms the above findings. IMPRESSION: 1. No emergent large vessel occlusion or hemodynamically significant stenosis of the head or neck. 2. 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 3. Aortic Atherosclerosis (ICD10-I70.0). Electronically Signed   By: Ulyses Jarred M.D.   On: 12/28/2021 20:17   CT HEAD WO CONTRAST (5MM)  Result Date: 12/28/2021 CLINICAL DATA:  Acute neurologic deficit EXAM: CT HEAD WITHOUT CONTRAST CT ANGIOGRAPHY OF THE HEAD AND NECK TECHNIQUE: Contiguous axial images were obtained from the base of the skull through the vertex without intravenous contrast. Multidetector CT imaging of the head and neck was performed using the standard protocol during bolus administration of intravenous contrast. Multiplanar CT image reconstructions and MIPs were obtained to evaluate the vascular anatomy. Carotid stenosis measurements (when applicable) are obtained utilizing NASCET criteria, using the distal internal carotid diameter as the denominator. RADIATION DOSE  REDUCTION: This exam was performed according to the departmental dose-optimization program which includes automated exposure control, adjustment of the mA and/or kV according to patient size and/or use of iterative reconstruction technique. CONTRAST:  62m OMNIPAQUE IOHEXOL 350 MG/ML SOLN COMPARISON:  None Available. FINDINGS: CT HEAD FINDINGS Brain: There is no mass, hemorrhage or extra-axial collection. There is generalized atrophy without lobar predilection. There are old bilateral basal ganglia small vessel infarcts and left cerebellar infarct. There is hypoattenuation of the periventricular white matter, most commonly indicating chronic ischemic microangiopathy. Skull: The visualized skull base, calvarium and extracranial soft tissues are normal. Sinuses/Orbits: No fluid levels or advanced mucosal thickening of the visualized paranasal sinuses. No mastoid or middle ear effusion. The orbits are normal. CTA NECK FINDINGS SKELETON: There is no bony spinal canal stenosis. No lytic or blastic lesion. OTHER NECK: Normal pharynx, larynx and major salivary glands. No cervical lymphadenopathy. Unremarkable thyroid gland. UPPER CHEST: No pneumothorax or pleural effusion. No nodules or masses. AORTIC ARCH: There is calcific atherosclerosis of the aortic arch. There is no aneurysm, dissection or hemodynamically significant stenosis of the visualized portion of the aorta. Conventional 3 vessel aortic branching pattern. The visualized proximal  subclavian arteries are widely patent. RIGHT CAROTID SYSTEM: Normal without aneurysm, dissection or stenosis. LEFT CAROTID SYSTEM: Normal without aneurysm, dissection or stenosis. VERTEBRAL ARTERIES: Codominant configuration. Both origins are clearly patent. There is no dissection, occlusion or flow-limiting stenosis to the skull base (V1-V3 segments). CTA HEAD FINDINGS POSTERIOR CIRCULATION: --Vertebral arteries: Normal V4 segments. --Inferior cerebellar arteries: Normal. --Basilar  artery: Normal. --Superior cerebellar arteries: Normal. --Posterior cerebral arteries (PCA): Normal. ANTERIOR CIRCULATION: --Intracranial internal carotid arteries: Normal. --Anterior cerebral arteries (ACA): Normal. Both A1 segments are present. Patent anterior communicating artery (a-comm). --Middle cerebral arteries (MCA): Normal. VENOUS SINUSES: As permitted by contrast timing, patent. ANATOMIC VARIANTS: Fetal origin of the right posterior cerebral artery. Review of the MIP images confirms the above findings. IMPRESSION: 1. No emergent large vessel occlusion or hemodynamically significant stenosis of the head or neck. 2. 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 3. Aortic Atherosclerosis (ICD10-I70.0). Electronically Signed   By: Ulyses Jarred M.D.   On: 12/28/2021 20:17    EKG: Independently reviewed.  Normal sinus rhythm.  Assessment/Plan Principal Problem:   Acute CVA (cerebrovascular accident) [facility]) Active Problems:   Essential hypertension    Acute CVA -appreciate neurology consult.  Patient symptoms and physical exam are consistent with acute CVA.  Patient did pass stroke swallow.  At this time neurology want to confirm there is no active GI bleed so holding antiplatelet.  Patient's LDL is elevated at 116 we will keep patient on Lipitor.  Neurochecks.  Physical therapy consult.  Check 2D echo continue to monitor telemetry. History of hypertension we will allow for permissive hypertension for now with as needed IV hydralazine for systolic more than 833 and diastolic more than 383. History of peptic ulcer disease with GI bleed has not been taking any medications at home.  We will keep patient on PPI for now.  Continue to monitor CBC. Chronic kidney disease stage III creatinine appears to be at baseline.  Continue to monitor. Fall with laceration of the left forearm.  We will x-ray of his left forearm and also pelvis. History of tobacco abuse advised about quitting. Marijuana  use -will need counseling.   Since patient has acute CVA with left-sided hemiparesis will need close monitoring and inpatient status.   DVT prophylaxis: Lovenox. Code Status: Full code. Family Communication: Discussed with patient. Disposition Plan: To be determined. Consults called: Neurology and physical therapy. Admission status: Inpatient.   Rise Patience MD [facility] Pager [phone].  If 7PM-7AM, please contact night-coverage [email or web address] Password [facility]  12/28/2021, 9:49 PM

## 2021-12-28 NOTE — ED Provider Notes (Signed)
[facility] Provider Note   CSN: [id number] Arrival date & time: 12/28/21  1749     History  No chief complaint on file.   Chad Santos is a 61 y.o. male.  HPI  This patient is a 62 year old male, he presents to the hospital today with left-sided unilateral weakness which she states started last night at 1:00 in the morning.  The patient reports that he fell while he was in the bathroom, it took him several hours to get up off of the ground and ultimately when he called his family today they noticed that his speech was slurred, and when they went over to his house they found him to have left-sided weakness and slurred speech.  Paramedics were called.  The patient recalls falling and striking his left arm, he does have a laceration to the forearm which has been present for approximately 18 hours.  He denies hitting his head.  He does not have a headache.  There is no nausea vomiting or diarrhea.  Paramedics report some hypertension but a normal glucose.  The patient denies any history of a stroke.  Home Medications Prior to Admission medications   Medication Sig Start Date End Date Taking? Authorizing Provider  hydrOXYzine (ATARAX) 25 MG tablet Take 1 tablet (25 mg total) by mouth at bedtime as needed (insomnia). 12/16/21   Barrett Henle, MD  pantoprazole (PROTONIX) 40 MG tablet Take 1 tablet (40 mg total) by mouth 2 (two) times daily. 10/12/21   Thornton Park, MD      Allergies    Patient has no known allergies.    Review of Systems   Review of Systems  All other systems reviewed and are negative.  Physical Exam Updated Vital Signs BP (!) 169/112   Pulse 64   Resp 18   SpO2 98%  Physical Exam Vitals and nursing note reviewed.  Constitutional:      General: He is not in acute distress.    Appearance: He is well-developed.  HENT:     Head: Normocephalic and atraumatic.     Mouth/Throat:     Pharynx: No oropharyngeal  exudate.  Eyes:     General: No scleral icterus.       Right eye: No discharge.        Left eye: No discharge.     Conjunctiva/sclera: Conjunctivae normal.     Pupils: Pupils are equal, round, and reactive to light.  Neck:     Thyroid: No thyromegaly.     Vascular: No JVD.  Cardiovascular:     Rate and Rhythm: Normal rate and regular rhythm.     Heart sounds: Normal heart sounds. No murmur heard.   No friction rub. No gallop.  Pulmonary:     Effort: Pulmonary effort is normal. No respiratory distress.     Breath sounds: Normal breath sounds. No wheezing or rales.  Abdominal:     General: Bowel sounds are normal. There is no distension.     Palpations: Abdomen is soft. There is no mass.     Tenderness: There is no abdominal tenderness.  Musculoskeletal:        General: No tenderness. Normal range of motion.     Cervical back: Normal range of motion and neck supple.  Lymphadenopathy:     Cervical: No cervical adenopathy.  Skin:    General: Skin is warm and dry.     Findings: No erythema or rash.  Neurological:  Mental Status: He is alert.     Coordination: Coordination normal.     Comments: The patient has a slight left-sided pronator drift, left arm is slightly weaker at the triceps, difficulty with finger-nose-finger with the left upper extremity, speech is accurate but slightly slurred, left lower extremity is 4-1/2 out of 5, right side is totally normal including face arm and leg and strength and sensation.  The patient has normal peripheral visual fields  Psychiatric:        Behavior: Behavior normal.    ED Results / Procedures / Treatments   Labs (all labs ordered are listed, but only abnormal results are displayed) Labs Reviewed  COMPREHENSIVE METABOLIC PANEL - Abnormal; Notable for the following components:      Result Value   Creatinine, Ser 1.35 (*)    GFR, Estimated 59 (*)    All other components within normal limits  HEMOGLOBIN A1C - Abnormal; Notable for  the following components:   Hgb A1c MFr Bld 6.1 (*)    All other components within normal limits  LIPID PANEL - Abnormal; Notable for the following components:   LDL Cholesterol 116 (*)    All other components within normal limits  I-STAT CHEM 8, ED - Abnormal; Notable for the following components:   Creatinine, Ser 1.30 (*)    Calcium, Ion 1.07 (*)    All other components within normal limits  RESP PANEL BY RT-PCR (FLU A&B, COVID) ARPGX2  ETHANOL  PROTIME-INR  APTT  CBC  DIFFERENTIAL  RAPID URINE DRUG SCREEN, HOSP PERFORMED  URINALYSIS, ROUTINE W REFLEX MICROSCOPIC    EKG EKG Interpretation  Date/Time:  Wednesday December 28 2021 18:01:10 EDT Ventricular Rate:  75 PR Interval:  137 QRS Duration: 93 QT Interval:  437 QTC Calculation: 489 R Axis:   79 Text Interpretation: Sinus rhythm Consider right atrial enlargement Consider left ventricular hypertrophy Borderline prolonged QT interval since last tracing no significant change Confirmed by Noemi Chapel [id number]) on 12/28/2021 6:06:46 PM  Radiology CT ANGIO HEAD NECK W WO CM  Result Date: 12/28/2021 CLINICAL DATA:  Acute neurologic deficit EXAM: CT HEAD WITHOUT CONTRAST CT ANGIOGRAPHY OF THE HEAD AND NECK TECHNIQUE: Contiguous axial images were obtained from the base of the skull through the vertex without intravenous contrast. Multidetector CT imaging of the head and neck was performed using the standard protocol during bolus administration of intravenous contrast. Multiplanar CT image reconstructions and MIPs were obtained to evaluate the vascular anatomy. Carotid stenosis measurements (when applicable) are obtained utilizing NASCET criteria, using the distal internal carotid diameter as the denominator. RADIATION DOSE REDUCTION: This exam was performed according to the departmental dose-optimization program which includes automated exposure control, adjustment of the mA and/or kV according to patient size and/or use of iterative  reconstruction technique. CONTRAST:  19m OMNIPAQUE IOHEXOL 350 MG/ML SOLN COMPARISON:  None Available. FINDINGS: CT HEAD FINDINGS Brain: There is no mass, hemorrhage or extra-axial collection. There is generalized atrophy without lobar predilection. There are old bilateral basal ganglia small vessel infarcts and left cerebellar infarct. There is hypoattenuation of the periventricular white matter, most commonly indicating chronic ischemic microangiopathy. Skull: The visualized skull base, calvarium and extracranial soft tissues are normal. Sinuses/Orbits: No fluid levels or advanced mucosal thickening of the visualized paranasal sinuses. No mastoid or middle ear effusion. The orbits are normal. CTA NECK FINDINGS SKELETON: There is no bony spinal canal stenosis. No lytic or blastic lesion. OTHER NECK: Normal pharynx, larynx and major salivary glands. No cervical lymphadenopathy.  Unremarkable thyroid gland. UPPER CHEST: No pneumothorax or pleural effusion. No nodules or masses. AORTIC ARCH: There is calcific atherosclerosis of the aortic arch. There is no aneurysm, dissection or hemodynamically significant stenosis of the visualized portion of the aorta. Conventional 3 vessel aortic branching pattern. The visualized proximal subclavian arteries are widely patent. RIGHT CAROTID SYSTEM: Normal without aneurysm, dissection or stenosis. LEFT CAROTID SYSTEM: Normal without aneurysm, dissection or stenosis. VERTEBRAL ARTERIES: Codominant configuration. Both origins are clearly patent. There is no dissection, occlusion or flow-limiting stenosis to the skull base (V1-V3 segments). CTA HEAD FINDINGS POSTERIOR CIRCULATION: --Vertebral arteries: Normal V4 segments. --Inferior cerebellar arteries: Normal. --Basilar artery: Normal. --Superior cerebellar arteries: Normal. --Posterior cerebral arteries (PCA): Normal. ANTERIOR CIRCULATION: --Intracranial internal carotid arteries: Normal. --Anterior cerebral arteries (ACA): Normal.  Both A1 segments are present. Patent anterior communicating artery (a-comm). --Middle cerebral arteries (MCA): Normal. VENOUS SINUSES: As permitted by contrast timing, patent. ANATOMIC VARIANTS: Fetal origin of the right posterior cerebral artery. Review of the MIP images confirms the above findings. IMPRESSION: 1. No emergent large vessel occlusion or hemodynamically significant stenosis of the head or neck. 2. 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 3. Aortic Atherosclerosis (ICD10-I70.0). Electronically Signed   By: Ulyses Jarred M.D.   On: 12/28/2021 20:17   CT HEAD WO CONTRAST (5MM)  Result Date: 12/28/2021 CLINICAL DATA:  Acute neurologic deficit EXAM: CT HEAD WITHOUT CONTRAST CT ANGIOGRAPHY OF THE HEAD AND NECK TECHNIQUE: Contiguous axial images were obtained from the base of the skull through the vertex without intravenous contrast. Multidetector CT imaging of the head and neck was performed using the standard protocol during bolus administration of intravenous contrast. Multiplanar CT image reconstructions and MIPs were obtained to evaluate the vascular anatomy. Carotid stenosis measurements (when applicable) are obtained utilizing NASCET criteria, using the distal internal carotid diameter as the denominator. RADIATION DOSE REDUCTION: This exam was performed according to the departmental dose-optimization program which includes automated exposure control, adjustment of the mA and/or kV according to patient size and/or use of iterative reconstruction technique. CONTRAST:  22m OMNIPAQUE IOHEXOL 350 MG/ML SOLN COMPARISON:  None Available. FINDINGS: CT HEAD FINDINGS Brain: There is no mass, hemorrhage or extra-axial collection. There is generalized atrophy without lobar predilection. There are old bilateral basal ganglia small vessel infarcts and left cerebellar infarct. There is hypoattenuation of the periventricular white matter, most commonly indicating chronic ischemic  microangiopathy. Skull: The visualized skull base, calvarium and extracranial soft tissues are normal. Sinuses/Orbits: No fluid levels or advanced mucosal thickening of the visualized paranasal sinuses. No mastoid or middle ear effusion. The orbits are normal. CTA NECK FINDINGS SKELETON: There is no bony spinal canal stenosis. No lytic or blastic lesion. OTHER NECK: Normal pharynx, larynx and major salivary glands. No cervical lymphadenopathy. Unremarkable thyroid gland. UPPER CHEST: No pneumothorax or pleural effusion. No nodules or masses. AORTIC ARCH: There is calcific atherosclerosis of the aortic arch. There is no aneurysm, dissection or hemodynamically significant stenosis of the visualized portion of the aorta. Conventional 3 vessel aortic branching pattern. The visualized proximal subclavian arteries are widely patent. RIGHT CAROTID SYSTEM: Normal without aneurysm, dissection or stenosis. LEFT CAROTID SYSTEM: Normal without aneurysm, dissection or stenosis. VERTEBRAL ARTERIES: Codominant configuration. Both origins are clearly patent. There is no dissection, occlusion or flow-limiting stenosis to the skull base (V1-V3 segments). CTA HEAD FINDINGS POSTERIOR CIRCULATION: --Vertebral arteries: Normal V4 segments. --Inferior cerebellar arteries: Normal. --Basilar artery: Normal. --Superior cerebellar arteries: Normal. --Posterior cerebral arteries (PCA): Normal. ANTERIOR CIRCULATION: --Intracranial internal  carotid arteries: Normal. --Anterior cerebral arteries (ACA): Normal. Both A1 segments are present. Patent anterior communicating artery (a-comm). --Middle cerebral arteries (MCA): Normal. VENOUS SINUSES: As permitted by contrast timing, patent. ANATOMIC VARIANTS: Fetal origin of the right posterior cerebral artery. Review of the MIP images confirms the above findings. IMPRESSION: 1. No emergent large vessel occlusion or hemodynamically significant stenosis of the head or neck. 2. Old bilateral basal ganglia  and left cerebellar infarcts. 3. Aortic Atherosclerosis (ICD10-I70.0). Electronically Signed   By: Ulyses Jarred M.D.   On: 12/28/2021 20:17    Procedures Procedures    Medications Ordered in ED Medications  bacitracin ointment 1 application. (has no administration in time range)  iohexol (OMNIPAQUE) 350 MG/ML injection 75 mL (75 mLs Intravenous Contrast Given 12/28/21 1956)    ED Course/ Medical Decision Making/ A&P                           Medical Decision Making Amount and/or Complexity of Data Reviewed Labs: ordered. Radiology: ordered.  Risk OTC drugs. Prescription drug management. Decision regarding hospitalization.   This patient presents to the ED for concern of stroke, has happened more than 4 and half hours ago, he is not a tPA candidate, this involves an extensive number of treatment options, and is a complaint that carries with it a high risk of complications and morbidity.  The differential diagnosis includes stroke, seizure, hypoglycemia, intoxicated, trauma, subdural, mass   Co morbidities that complicate the patient evaluation  Recent GI bleed, he is not anticoagulated Takes hydroxyzine for sleep Takes pantoprazole for acid reflux   Additional history obtained:  Additional history obtained from electronic medical record External records from outside source obtained and reviewed including recent endoscopies   Lab Tests:  I Ordered, and personally interpreted labs.  The pertinent results include: CBC and metabolic panel, there does appear to be a creatinine of 1.3 hemoglobin of 6.1, no significant anemia or leukocytosis.   Imaging Studies ordered:  I ordered imaging studies including CT scan of the head and CT scan angiogram of the head and neck were performed and did not show any acute abnormalities or large vessel occlusions.  There does appear to be some old strokes I independently visualized and interpreted imaging which showed old strokes but  nothing new I agree with the radiologist interpretation   Cardiac Monitoring: / EKG:  The patient was maintained on a cardiac monitor.  I personally viewed and interpreted the cardiac monitored which showed an underlying rhythm of: Normal sinus rhythm   Consultations Obtained:  I requested consultation with the neurologist Dr. Leonie Man,  and discussed lab and imaging findings as well as pertinent plan - they recommend: Evaluation for stroke including CT angiograms, recommend admission to the hospital, does not qualify for aggressive management with either thrombolytic therapy or clot retrieval I discussed the care with the hospitalist Dr. Hal Hope who will admit  Problem List / ED Course / Critical interventions / Medication management  Stroke, patient has been evaluated by neurology, they recommend inpatient treatment, this has been ordered and I discussed with the hospitalist who is agreeable to admit, patient's remains permissively hypertensive with no tachycardia or fever I have reviewed the patients home medicines and have made adjustments as needed   Social Determinants of Health:  None   Test / Admission - Considered:  Considered admission and will admit to the hospital         Final Clinical Impression(s) /  ED Diagnoses Final diagnoses:  Acute ischemic stroke [facility])    Rx / DC Orders ED Discharge Orders     None         Noemi Chapel, MD 12/28/21 2057

## 2021-12-28 NOTE — ED Triage Notes (Signed)
PT BIB [facility] for stroke like symptoms.  LKW 1600 yesterday.  Sister called pt at 1500  today and his speech sounded slurred so she went over and found him to have weakness and blood in the bathroom.  PT reports he fell in bathroom around 1am.  He has a small lac to left FA.  EMS reports slurred speech, left facial droop and left arm drift and weak left grip strength.    Pt recently had an endoscopy.    EMS vitals 160/90 99% 80 [id number]

## 2021-12-28 NOTE — ED Notes (Signed)
To mri 

## 2021-12-29 ENCOUNTER — Other Ambulatory Visit: Payer: Self-pay

## 2021-12-29 ENCOUNTER — Inpatient Hospital Stay (HOSPITAL_COMMUNITY): Payer: Medicaid Other

## 2021-12-29 DIAGNOSIS — I6389 Other cerebral infarction: Secondary | ICD-10-CM

## 2021-12-29 DIAGNOSIS — I639 Cerebral infarction, unspecified: Secondary | ICD-10-CM | POA: Diagnosis not present

## 2021-12-29 LAB — ECHOCARDIOGRAM COMPLETE
AR max vel: 2.63 cm2
AV Peak grad: 8 mmHg
Ao pk vel: 1.41 m/s
Area-P 1/2: 3.6 cm2
Calc EF: 46.2 %
S' Lateral: 3.5 cm
Single Plane A2C EF: 49.7 %
Single Plane A4C EF: 42.6 %

## 2021-12-29 MED ORDER — NICOTINE 21 MG/24HR TD PT24
21.0000 mg | MEDICATED_PATCH | Freq: Every day | TRANSDERMAL | Status: DC
  Administered 2021-12-29 – 2022-01-01 (×4): 21 mg via TRANSDERMAL
  Filled 2021-12-29 (×4): qty 1

## 2021-12-29 MED ORDER — CLOPIDOGREL BISULFATE 75 MG PO TABS
75.0000 mg | ORAL_TABLET | Freq: Every day | ORAL | Status: DC
  Administered 2021-12-29 – 2022-01-01 (×4): 75 mg via ORAL
  Filled 2021-12-29 (×4): qty 1

## 2021-12-29 MED ORDER — CLOPIDOGREL BISULFATE 75 MG PO TABS: 75.0000 mg | ORAL_TABLET | Freq: Every day | ORAL | Status: DC

## 2021-12-29 MED ORDER — ALPRAZOLAM 0.25 MG PO TABS
0.2500 mg | ORAL_TABLET | Freq: Two times a day (BID) | ORAL | Status: DC | PRN
  Administered 2021-12-29 – 2021-12-31 (×5): 0.25 mg via ORAL
  Filled 2021-12-29 (×5): qty 1

## 2021-12-29 NOTE — ED Notes (Signed)
Up with PT

## 2021-12-29 NOTE — ED Notes (Signed)
Small dime size wound on his lt mid forearm  cleaned with antiseptic bacitracin applied and a bandage placed

## 2021-12-29 NOTE — ED Notes (Signed)
The pt has been nervous and jittery all shift.  He has pulled off all his leads and his pulse ox  x3  he was out of bed and voided in the trash can and the floor with a condom cath attached to the bed now.

## 2021-12-29 NOTE — Evaluation (Signed)
Occupational Therapy Evaluation Patient Details Name: Chad Santos MRN: [id number] DOB: 11-17-1960 Today's Date: 12/29/2021   History of Present Illness 61 y.o. M admitted on 12/28/21 due to slurred speech and L sided weakness. 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 PMH significant for  peptic ulcer disease with GI bleed, hypertension, tobacco abuse.   Clinical Impression   Pt admitted for concerns listed above. PTA Pt reported that he was independent with BADL's and functional mobility, using a walking cane. At this time, pt presents with increased weakness, balance deficits, cognitive concerns, and LUE incoordination. He is requiring min A for most ADL's and min A +2 for HHA with functional mobility. Recommending AIR to maximize his independence and safety. OT will follow acutely.       Recommendations for follow up therapy are one component of a multi-disciplinary discharge planning process, led by the attending physician.  Recommendations may be updated based on patient status, additional functional criteria and insurance authorization.   Follow Up Recommendations  Acute inpatient rehab (3hours/day)    Assistance Recommended at Discharge Frequent or constant Supervision/Assistance  Patient can return home with the following A lot of help with walking and/or transfers;A lot of help with bathing/dressing/bathroom;Assistance with cooking/housework;Direct supervision/assist for medications management;Direct supervision/assist for financial management;Assist for transportation;Help with stairs or ramp for entrance    Functional Status Assessment  Patient has had a recent decline in their functional status and demonstrates the ability to make significant improvements in function in a reasonable and predictable amount of time.  Equipment Recommendations  BSC/3in1    Recommendations for Other Services Rehab consult     Precautions /  Restrictions Precautions Precautions: Fall Restrictions Weight Bearing Restrictions: No      Mobility Bed Mobility Overal bed mobility: Modified Independent                  Transfers Overall transfer level: Needs assistance Equipment used: 2 person hand held assist Transfers: Sit to/from Stand Sit to Stand: Min assist, +2 safety/equipment           General transfer comment: Min A +2 to power up and steady. Reliant on BUE for balance      Balance Overall balance assessment: Needs assistance Sitting-balance support: No upper extremity supported, Feet supported Sitting balance-Leahy Scale: Good     Standing balance support: Bilateral upper extremity supported, During functional activity Standing balance-Leahy Scale: Poor                             ADL either performed or assessed with clinical judgement   ADL Overall ADL's : Needs assistance/impaired Eating/Feeding: Set up;Sitting   Grooming: Set up;Sitting   Upper Body Bathing: Min guard;Sitting   Lower Body Bathing: Minimal assistance;Sitting/lateral leans;Sit to/from stand   Upper Body Dressing : Set up;Sitting   Lower Body Dressing: Minimal assistance;Sitting/lateral leans;Sit to/from stand   Toilet Transfer: Minimal assistance;+2 for safety/equipment;Ambulation   Toileting- Clothing Manipulation and Hygiene: Minimal assistance;Sitting/lateral lean;Sit to/from stand       Functional mobility during ADLs: Minimal assistance;+2 for safety/equipment General ADL Comments: Pt needing increased assist due to balance and mild decrease in LUE coordinatino     Vision Baseline Vision/History: 0 No visual deficits Ability to See in Adequate Light: 0 Adequate Patient Visual Report: No change from baseline Vision Assessment?: Vision impaired- to be further tested in functional context Additional Comments: Pt having difficulty following  commands to assess vision. Appears to have some L sided  field cut     Perception     Praxis      Pertinent Vitals/Pain Pain Assessment Pain Assessment: No/denies pain     Hand Dominance Right   Extremity/Trunk Assessment Upper Extremity Assessment Upper Extremity Assessment: LUE deficits/detail LUE Deficits / Details: decreased coordination and mild weakness LUE Sensation: decreased light touch LUE Coordination: decreased fine motor   Lower Extremity Assessment Lower Extremity Assessment: Defer to PT evaluation   Cervical / Trunk Assessment Cervical / Trunk Assessment: Normal   Communication Communication Communication: Expressive difficulties   Cognition Arousal/Alertness: Awake/alert Behavior During Therapy: WFL for tasks assessed/performed Overall Cognitive Status: Impaired/Different from baseline Area of Impairment: Attention, Following commands, Safety/judgement, Awareness, Problem solving                   Current Attention Level: Sustained   Following Commands: Follows one step commands consistently, Follows one step commands with increased time Safety/Judgement: Decreased awareness of safety, Decreased awareness of deficits Awareness: Emergent Problem Solving: Slow processing, Requires verbal cues General Comments: Pt with decreased awareness to safety and deficits, requiring increased time     General Comments  VSS on RA    Exercises     Shoulder Instructions      Home Living Family/patient expects to be discharged to:: Private residence Living Arrangements: Alone Available Help at Discharge: Family Type of Home: House Home Access: Stairs to enter Technical brewer of Steps: 4 Entrance Stairs-Rails: Right Home Layout: One level     Bathroom Shower/Tub: Teacher, early years/pre: Standard     Home Equipment: Cane - single point          Prior Functioning/Environment Prior Level of Function : Independent/Modified Independent;History of Falls (last six months)              Mobility Comments: Uses cane for mobility ADLs Comments: Reports independence        OT Problem List: Decreased strength;Decreased activity tolerance;Impaired balance (sitting and/or standing);Impaired vision/perception;Decreased coordination;Decreased cognition;Decreased safety awareness;Decreased knowledge of use of DME or AE;Impaired UE functional use      OT Treatment/Interventions: Self-care/ADL training;Therapeutic exercise;Energy conservation;DME and/or AE instruction;Therapeutic activities;Visual/perceptual remediation/compensation;Cognitive remediation/compensation;Patient/family education;Balance training    OT Goals(Current goals can be found in the care plan section) Acute Rehab OT Goals Patient Stated Goal: To go home OT Goal Formulation: With patient Time For Goal Achievement: 01/12/22 Potential to Achieve Goals: Good ADL Goals Pt Will Perform Grooming: with modified independence;standing Pt Will Perform Lower Body Bathing: with modified independence;sitting/lateral leans;sit to/from stand Pt Will Perform Lower Body Dressing: with modified independence;sit to/from stand;sitting/lateral leans Pt Will Transfer to Toilet: with modified independence;ambulating Pt Will Perform Toileting - Clothing Manipulation and hygiene: with modified independence;sitting/lateral leans;sit to/from stand  OT Frequency: Min 2X/week    Co-evaluation              AM-PAC OT "6 Clicks" Daily Activity     Outcome Measure Help from another person eating meals?: A Little Help from another person taking care of personal grooming?: A Little Help from another person toileting, which includes using toliet, bedpan, or urinal?: A Little Help from another person bathing (including washing, rinsing, drying)?: A Little Help from another person to put on and taking off regular upper body clothing?: A Little Help from another person to put on and taking off regular lower body clothing?: A Little 6  Click Score: 18   End of  Session Equipment Utilized During Treatment: Gait belt Nurse Communication: Mobility status  Activity Tolerance: Patient tolerated treatment well Patient left: in bed;with call bell/phone within reach;with family/visitor present  OT Visit Diagnosis: Unsteadiness on feet (R26.81);Other abnormalities of gait and mobility (R26.89);Muscle weakness (generalized) (M62.81)                Time: 1110-1130 OT Time Calculation (min): 20 min Charges:  OT General Charges $OT Visit: 1 Visit OT Evaluation $OT Eval Moderate Complexity: 1 Mod  Chassidy Layson H., OTR/L Acute Rehabilitation  Shigeo Baugh Elane Maylie Ashton 12/29/2021, 1:07 PM

## 2021-12-29 NOTE — ED Notes (Signed)
The pt has not slept all night  up to bedside commode   he has pulled all his electrodes off again

## 2021-12-29 NOTE — Progress Notes (Addendum)
STROKE TEAM PROGRESS NOTE   INTERVAL HISTORY No family at the bedside. Sitting up in the chair. Awaiting echo results. Start statin and plavix. No ASA '81mg'$  due to recent GI bleed. Nicotine patch ordered His dysarthria is improved but continues to have facial weakness and left hemiparesis.  Vital signs are stable.  MRI scan of the brain shows small right internal capsule lacunar infarct.  Old medical Previfem and cerebellar infarcts also noted. Vitals:   12/29/21 0015 12/29/21 0315 12/29/21 0400 12/29/21 0715  BP: (!) 171/105 (!) 181/106 (!) 166/92 (!) 170/99  Pulse: 68 70 86 69  Resp: 16 (!) 22 16 (!) 22  Temp:      SpO2: 98% 97% 98% 98%   CBC:  Recent Labs  Lab 12/28/21 1834 12/28/21 1846  WBC 8.1  --   NEUTROABS 5.2  --   HGB 14.0 14.6  HCT 42.1 43.0  MCV 91.1  --   PLT 236  --    Basic Metabolic Panel:  Recent Labs  Lab 12/28/21 1834 12/28/21 1846  NA 139 139  K 4.2 4.1  CL 103 101  CO2 26  --   GLUCOSE 88 86  BUN 16 19  CREATININE 1.35* 1.30*  CALCIUM 9.5  --    Lipid Panel:  Recent Labs  Lab 12/28/21 1844  CHOL 189  TRIG 86  HDL 56  CHOLHDL 3.4  VLDL 17  LDLCALC 116*   HgbA1c:  Recent Labs  Lab 12/28/21 1844  HGBA1C 6.1*   Urine Drug Screen:  Recent Labs  Lab 12/28/21 2037  LABOPIA NONE DETECTED  COCAINSCRNUR NONE DETECTED  LABBENZ NONE DETECTED  AMPHETMU NONE DETECTED  THCU POSITIVE*  LABBARB NONE DETECTED    Alcohol Level  Recent Labs  Lab 12/28/21 1834  ETH <10    IMAGING past 24 hours MR BRAIN WO CONTRAST  Result Date: 12/28/2021 CLINICAL DATA:  Left-sided weakness EXAM: MRI HEAD WITHOUT CONTRAST MRA HEAD WITHOUT CONTRAST TECHNIQUE: Multiplanar, multi-echo pulse sequences of the brain and surrounding structures were acquired without intravenous contrast. Angiographic images of the Circle of Willis were acquired using MRA technique without intravenous contrast. COMPARISON:  None Available. FINDINGS: MRI HEAD FINDINGS Brain: Small  acute infarct along the posterior limb of the right internal capsule. No acute or chronic hemorrhage. There is multifocal hyperintense T2-weighted signal within the white matter. Generalized cerebral volume loss. 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 The midline structures are normal. Vascular: Major flow voids are preserved. Skull and upper cervical spine: Normal calvarium and skull base. Visualized upper cervical spine and soft tissues are normal. Sinuses/Orbits:No paranasal sinus fluid levels or advanced mucosal thickening. No mastoid or middle ear effusion. Normal orbits. MRA HEAD FINDINGS POSTERIOR CIRCULATION: --Vertebral arteries: Normal --Inferior cerebellar arteries: Normal. --Basilar artery: Normal. --Superior cerebellar arteries: Normal. --Posterior cerebral arteries: Normal. ANTERIOR CIRCULATION: --Intracranial internal carotid arteries: Normal. --Anterior cerebral arteries (ACA): Normal. --Middle cerebral arteries (MCA): Normal. ANATOMIC VARIANTS: Both posterior communicating arteries are patent. IMPRESSION: 1. Small acute infarct along the posterior limb of the right internal capsule. No hemorrhage or mass effect. 2. Normal intracranial MRA. 3. 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 Electronically Signed   By: Ulyses Jarred M.D.   On: 12/28/2021 23:44   MR ANGIO HEAD WO CONTRAST  Result Date: 12/28/2021 CLINICAL DATA:  Left-sided weakness EXAM: MRI HEAD WITHOUT CONTRAST MRA HEAD WITHOUT CONTRAST TECHNIQUE: Multiplanar, multi-echo pulse sequences of the brain and surrounding structures were  acquired without intravenous contrast. Angiographic images of the Circle of Willis were acquired using MRA technique without intravenous contrast. COMPARISON:  None Available. FINDINGS: MRI HEAD FINDINGS Brain: Small acute infarct along the posterior limb of the right internal capsule. No acute or chronic hemorrhage. There is multifocal hyperintense T2-weighted signal  within the white matter. Generalized cerebral volume loss. 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 The midline structures are normal. Vascular: Major flow voids are preserved. Skull and upper cervical spine: Normal calvarium and skull base. Visualized upper cervical spine and soft tissues are normal. Sinuses/Orbits:No paranasal sinus fluid levels or advanced mucosal thickening. No mastoid or middle ear effusion. Normal orbits. MRA HEAD FINDINGS POSTERIOR CIRCULATION: --Vertebral arteries: Normal --Inferior cerebellar arteries: Normal. --Basilar artery: Normal. --Superior cerebellar arteries: Normal. --Posterior cerebral arteries: Normal. ANTERIOR CIRCULATION: --Intracranial internal carotid arteries: Normal. --Anterior cerebral arteries (ACA): Normal. --Middle cerebral arteries (MCA): Normal. ANATOMIC VARIANTS: Both posterior communicating arteries are patent. IMPRESSION: 1. Small acute infarct along the posterior limb of the right internal capsule. No hemorrhage or mass effect. 2. Normal intracranial MRA. 3. 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 Electronically Signed   By: Ulyses Jarred M.D.   On: 12/28/2021 23:44   DG Forearm Left  Result Date: 12/28/2021 CLINICAL DATA:  Rash on arm. EXAM: LEFT FOREARM - 2 VIEW COMPARISON:  None Available. FINDINGS: There is focal soft tissue swelling over the posterior aspect of the proximal ulna. There is no radiopaque foreign body or soft tissue gas collection. There is no underlying cortical erosion or periosteal reaction. No acute fracture or dislocation. Antecubital IV is present. IMPRESSION: 1. Soft tissue swelling of the posterior forearm. 2. No acute bony abnormality. Electronically Signed   By: Ronney Asters M.D.   On: 12/28/2021 22:22   DG Pelvis 1-2 Views  Result Date: 12/28/2021 CLINICAL DATA:  Stroke-like symptoms.  Rash on the left arm. EXAM: PELVIS - 1-2 VIEW COMPARISON:  None Available. FINDINGS: There is no acute  fracture or dislocation. Moderate right and severe left hip osteoarthritic changes. There is near complete loss of left hip joint space with bone-on-bone contact. Excreted contrast noted within the urinary bladder outlining a slightly trabeculated appearing bladder wall, possibly related to chronic bladder outlet obstruction. The soft tissues are unremarkable. IMPRESSION: 1. No acute fracture or dislocation. 2. Moderate right and severe left hip osteoarthritic changes. Electronically Signed   By: Anner Crete M.D.   On: 12/28/2021 22:20   CT ANGIO HEAD NECK W WO CM  Result Date: 12/28/2021 CLINICAL DATA:  Acute neurologic deficit EXAM: CT HEAD WITHOUT CONTRAST CT ANGIOGRAPHY OF THE HEAD AND NECK TECHNIQUE: Contiguous axial images were obtained from the base of the skull through the vertex without intravenous contrast. Multidetector CT imaging of the head and neck was performed using the standard protocol during bolus administration of intravenous contrast. Multiplanar CT image reconstructions and MIPs were obtained to evaluate the vascular anatomy. Carotid stenosis measurements (when applicable) are obtained utilizing NASCET criteria, using the distal internal carotid diameter as the denominator. RADIATION DOSE REDUCTION: This exam was performed according to the departmental dose-optimization program which includes automated exposure control, adjustment of the mA and/or kV according to patient size and/or use of iterative reconstruction technique. CONTRAST:  58m OMNIPAQUE IOHEXOL 350 MG/ML SOLN COMPARISON:  None Available. FINDINGS: CT HEAD FINDINGS Brain: There is no mass, hemorrhage or extra-axial collection. There is generalized atrophy without lobar predilection. There are old bilateral basal ganglia small  vessel infarcts and left cerebellar infarct. There is hypoattenuation of the periventricular white matter, most commonly indicating chronic ischemic microangiopathy. Skull: The visualized skull base,  calvarium and extracranial soft tissues are normal. Sinuses/Orbits: No fluid levels or advanced mucosal thickening of the visualized paranasal sinuses. No mastoid or middle ear effusion. The orbits are normal. CTA NECK FINDINGS SKELETON: There is no bony spinal canal stenosis. No lytic or blastic lesion. OTHER NECK: Normal pharynx, larynx and major salivary glands. No cervical lymphadenopathy. Unremarkable thyroid gland. UPPER CHEST: No pneumothorax or pleural effusion. No nodules or masses. AORTIC ARCH: There is calcific atherosclerosis of the aortic arch. There is no aneurysm, dissection or hemodynamically significant stenosis of the visualized portion of the aorta. Conventional 3 vessel aortic branching pattern. The visualized proximal subclavian arteries are widely patent. RIGHT CAROTID SYSTEM: Normal without aneurysm, dissection or stenosis. LEFT CAROTID SYSTEM: Normal without aneurysm, dissection or stenosis. VERTEBRAL ARTERIES: Codominant configuration. Both origins are clearly patent. There is no dissection, occlusion or flow-limiting stenosis to the skull base (V1-V3 segments). CTA HEAD FINDINGS POSTERIOR CIRCULATION: --Vertebral arteries: Normal V4 segments. --Inferior cerebellar arteries: Normal. --Basilar artery: Normal. --Superior cerebellar arteries: Normal. --Posterior cerebral arteries (PCA): Normal. ANTERIOR CIRCULATION: --Intracranial internal carotid arteries: Normal. --Anterior cerebral arteries (ACA): Normal. Both A1 segments are present. Patent anterior communicating artery (a-comm). --Middle cerebral arteries (MCA): Normal. VENOUS SINUSES: As permitted by contrast timing, patent. ANATOMIC VARIANTS: Fetal origin of the right posterior cerebral artery. Review of the MIP images confirms the above findings. IMPRESSION: 1. No emergent large vessel occlusion or hemodynamically significant stenosis of the head or neck. 2. 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 3. Aortic  Atherosclerosis (ICD10-I70.0). Electronically Signed   By: Ulyses Jarred M.D.   On: 12/28/2021 20:17   CT HEAD WO CONTRAST (5MM)  Result Date: 12/28/2021 CLINICAL DATA:  Acute neurologic deficit EXAM: CT HEAD WITHOUT CONTRAST CT ANGIOGRAPHY OF THE HEAD AND NECK TECHNIQUE: Contiguous axial images were obtained from the base of the skull through the vertex without intravenous contrast. Multidetector CT imaging of the head and neck was performed using the standard protocol during bolus administration of intravenous contrast. Multiplanar CT image reconstructions and MIPs were obtained to evaluate the vascular anatomy. Carotid stenosis measurements (when applicable) are obtained utilizing NASCET criteria, using the distal internal carotid diameter as the denominator. RADIATION DOSE REDUCTION: This exam was performed according to the departmental dose-optimization program which includes automated exposure control, adjustment of the mA and/or kV according to patient size and/or use of iterative reconstruction technique. CONTRAST:  50m OMNIPAQUE IOHEXOL 350 MG/ML SOLN COMPARISON:  None Available. FINDINGS: CT HEAD FINDINGS Brain: There is no mass, hemorrhage or extra-axial collection. There is generalized atrophy without lobar predilection. There are old bilateral basal ganglia small vessel infarcts and left cerebellar infarct. There is hypoattenuation of the periventricular white matter, most commonly indicating chronic ischemic microangiopathy. Skull: The visualized skull base, calvarium and extracranial soft tissues are normal. Sinuses/Orbits: No fluid levels or advanced mucosal thickening of the visualized paranasal sinuses. No mastoid or middle ear effusion. The orbits are normal. CTA NECK FINDINGS SKELETON: There is no bony spinal canal stenosis. No lytic or blastic lesion. OTHER NECK: Normal pharynx, larynx and major salivary glands. No cervical lymphadenopathy. Unremarkable thyroid gland. UPPER CHEST: No  pneumothorax or pleural effusion. No nodules or masses. AORTIC ARCH: There is calcific atherosclerosis of the aortic arch. There is no aneurysm, dissection or hemodynamically significant stenosis of the visualized portion of the aorta. Conventional  3 vessel aortic branching pattern. The visualized proximal subclavian arteries are widely patent. RIGHT CAROTID SYSTEM: Normal without aneurysm, dissection or stenosis. LEFT CAROTID SYSTEM: Normal without aneurysm, dissection or stenosis. VERTEBRAL ARTERIES: Codominant configuration. Both origins are clearly patent. There is no dissection, occlusion or flow-limiting stenosis to the skull base (V1-V3 segments). CTA HEAD FINDINGS POSTERIOR CIRCULATION: --Vertebral arteries: Normal V4 segments. --Inferior cerebellar arteries: Normal. --Basilar artery: Normal. --Superior cerebellar arteries: Normal. --Posterior cerebral arteries (PCA): Normal. ANTERIOR CIRCULATION: --Intracranial internal carotid arteries: Normal. --Anterior cerebral arteries (ACA): Normal. Both A1 segments are present. Patent anterior communicating artery (a-comm). --Middle cerebral arteries (MCA): Normal. VENOUS SINUSES: As permitted by contrast timing, patent. ANATOMIC VARIANTS: Fetal origin of the right posterior cerebral artery. Review of the MIP images confirms the above findings. IMPRESSION: 1. No emergent large vessel occlusion or hemodynamically significant stenosis of the head or neck. 2. 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 3. Aortic Atherosclerosis (ICD10-I70.0). Electronically Signed   By: Ulyses Jarred M.D.   On: 12/28/2021 20:17    PHYSICAL EXAM GENERAL: Awake, alert, pleasant middle-aged African-American male in no acute distress LUNGS: Normal respiratory effort. Non-labored breathing on room air CV: Regular rate and rhythm on telemetry   NEURO:  Mental Status: Awake, alert, and oriented to person, place, time, and situation. He is able to provide a clear and  coherent history of present illness. Speech/Language: speech is dysarthric.   Naming, repetition, fluency, and comprehension intact without aphasia. No neglect is noted Cranial Nerves:  II: PERRL 33m/brisk. visual fields full.  III, IV, VI: EOMI. Lid elevation symmetric and full.  V: Sensation is intact to light touch and symmetrical to face. Blinks to threat. Moves jaw back and forth.  VII: Left facial droop VIII: Hearing intact to voice IX, X: Palate elevation is symmetric. Phonation normal.  XI: Normal sternocleidomastoid and trapezius muscle strength XII: Tongue is minimally deviated to the left Motor: Tone is normal. Bulk is normal.  RUE 5/5          LUE 4+/5 with subtle drift RLE 5/5           LLE 4/5 with drift Sensation: Intact to light touch bilaterally in all four extremities. No extinction to DSS present.  Coordination: FTN intact bilaterally. HKS intact bilaterally. No pronator drift. Alternating hand movements slowed on the left.  DTRs: 2+ throughout.  Gait: Deferred  ASSESSMENT/PLAN Mr. GGunnison Chahalis a 61y.o. male with history of recent GI bleed and HTN presenting to the hospital today with dysarthria, left side upper and lower extremity weakness, and left facial droop. He states that at 0100 in the morning he fell in the bathroom and he called his family who arrived and found him to have slurred speech, left side weakness, and a facial droop.   Stroke:  Small acute lacunar infarct in the posterior limb of the right internal capsule likely secondary small vessel disease source given uncontrolled risk factors and smoking Code Stroke CT head No acute abnormality.  CTA head & neck No LVO MRI  Small acute infarct along the posterior limb of the right internal capsule MRA  Normal intracranial MRA. 2D Echo pending LDL 116 HgbA1c 6.1 VTE prophylaxis - SCDs    There are no active orders of the following types: Diet, Nourishments.   No antithrombotic prior to admission,  now on clopidogrel 75 mg daily. Single agent due to recent GI bleed.  Therapy recommendations:  pending Disposition:  pending  Hypertension Home meds:  Losartan potassium Stable Permissive hypertension (OK if < 220/120) but gradually normalize in 5-7 days Long-term BP goal normotensive  Hyperlipidemia LDL 116, goal < 70 Add atorvastatin '80mg'$   High intensity statin not indicated  Continue statin at discharge  Other Stroke Risk Factors Cigarette smoker 1ppd, advised to stop smoking ETOH use, alcohol level <10, advised to drink no more than 2 drink(s) a day Substance abuse - UDS:  THC POSITIVE, Cocaine NONE DETECTED. Patient advised to stop using due to stroke risk. 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  Other Active Problems GI Bleed- 1/31 Home med: Pantoprazole Underwent EGD with GI that showed  a Mallory-Weiss tear, nonbleeding gastric ulcer and stigmata of bleeding. Hgb stable at [facility] day # 1  Patient seen and examined by NP/APP with MD. MD to update note as needed.   Janine Ores, DNP, FNP-BC [facility] Pager: [phone]  I have personally obtained history,examined this patient, reviewed notes, independently viewed imaging studies, participated in medical decision making and plan of care.ROS completed by me personally and pertinent positives fully documented  I have made any additions or clarifications directly to the above note. Agree with note above.  Continue ongoing therapies and stroke work-up.  Patient counseled again to quit smoking cigarettes and marijuana.  Continue single agent Plavix alone for stroke prevention given history of recent GI bleed and aggressive risk factor modification.  Will likely need transfer to inpatient rehab Long  discussion with patient and medical team and answered questions.  Greater than 50% time during this 35-minute visit with him in counseling and coordination of care about his lacunar strokes and discussion about stroke prevention and treatment and answering questions.  Antony Contras, MD Medical Director [place] Pager: [phone] 12/29/2021 12:47 PM   To contact Stroke Continuity provider, please refer to [email or web address] After hours, contact General Neurology

## 2021-12-29 NOTE — TOC CAGE-AID Note (Signed)
Transition of Care [facility]) - CAGE-AID Screening   Patient Details  Name: Chad Santos MRN: [id number] Date of Birth: Aug 15, 1960  Transition of Care [facility]) CM/SW Contact:    Brazos Sandoval C Tarpley-Carter, [place] Phone Number: 12/29/2021, 12:07 PM   Clinical Narrative: Pt participated in [place].  Pt stated he does use substance and ETOH.  Pt was offered resources, due to usage of substance and ETOH.    Rohn Fritsch Tarpley-Carter, MSW, LCSW-A Pronouns:  She/Her/Hers [facility] Clinical Social Worker Direct Number:  [phone] [email or web address]  CAGE-AID Screening:    Have You Ever Felt You Ought to Cut Down on Your Drinking or Drug Use?: Yes Have People Annoyed You By [employer] Your Drinking Or Drug Use?: No Have You Felt Bad Or Guilty About Your Drinking Or Drug Use?: No Have You Ever Had a Drink or Used Drugs First Thing In The Morning to Steady Your Nerves or to Get Rid of a Hangover?: No CAGE-AID Score: 1  Substance Abuse Education Offered: Yes  Substance abuse interventions: Scientist, clinical (histocompatibility and immunogenetics)

## 2021-12-29 NOTE — Evaluation (Signed)
Physical Therapy Evaluation Patient Details Name: Chad Santos MRN: [id number] DOB: 04-26-1961 Today's Date: 12/29/2021  History of Present Illness  61 y.o. M admitted on 12/28/21 due to slurred speech and L sided weakness. 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 PMH significant for  peptic ulcer disease with GI bleed, hypertension, tobacco abuse.  Clinical Impression  Pt admitted secondary to problem above with deficits below. Pt requiring min to mod A +2 for steadying during mobility tasks this session. Notable cognitive deficits, and required cues for safety and sequencing throughout. Recommending AIR level therapies at d/c to address current mobility deficits. Pt's sister reports family plans to move in with pt and can assist as needed. Will continue to follow acutely.        Recommendations for follow up therapy are one component of a multi-disciplinary discharge planning process, led by the attending physician.  Recommendations may be updated based on patient status, additional functional criteria and insurance authorization.  Follow Up Recommendations Acute inpatient rehab (3hours/day)    Assistance Recommended at Discharge Frequent or constant Supervision/Assistance  Patient can return home with the following  A lot of help with bathing/dressing/bathroom;A lot of help with walking and/or transfers;Assistance with cooking/housework;Assist for transportation;Help with stairs or ramp for entrance    Equipment Recommendations Other (comment) (TBD)  Recommendations for Other Services  Rehab consult    Functional Status Assessment Patient has had a recent decline in their functional status and demonstrates the ability to make significant improvements in function in a reasonable and predictable amount of time.     Precautions / Restrictions Precautions Precautions: Fall Restrictions Weight Bearing Restrictions: No      Mobility  Bed  Mobility Overal bed mobility: Modified Independent                  Transfers Overall transfer level: Needs assistance Equipment used: 2 person hand held assist Transfers: Sit to/from Stand Sit to Stand: Min assist, +2 safety/equipment           General transfer comment: Min A +2 to power up and steady. Reliant on BUE for balance    Ambulation/Gait Ambulation/Gait assistance: Min assist, Mod assist Gait Distance (Feet): 60 Feet Assistive device: 2 person hand held assist Gait Pattern/deviations: Step-through pattern, Decreased stride length, Narrow base of support Gait velocity: Decreased     General Gait Details: Decreased coordination noted and unsteady. LOB X2 during mobility requiring up to mod A for steadying.  Stairs            Wheelchair Mobility    Modified Rankin (Stroke Patients Only) Modified Rankin (Stroke Patients Only) Pre-Morbid Rankin Score: No significant disability Modified Rankin: Moderately severe disability     Balance Overall balance assessment: Needs assistance Sitting-balance support: No upper extremity supported, Feet supported Sitting balance-Leahy Scale: Good     Standing balance support: Bilateral upper extremity supported, During functional activity Standing balance-Leahy Scale: Poor Standing balance comment: Reliant on UE and external support                             Pertinent Vitals/Pain Pain Assessment Pain Assessment: No/denies pain    Home Living Family/patient expects to be discharged to:: Private residence Living Arrangements: Alone Available Help at Discharge: Family;Available 24 hours/day Type of Home: House Home Access: Stairs to enter Entrance Stairs-Rails: Right Entrance Stairs-Number of Steps: 4   Home Layout: One level Home  Equipment: Kasandra Knudsen - single point Additional Comments: Pt's sister reports another sister will be moving into pt's home soon    Prior Function Prior Level of  Function : Independent/Modified Independent;History of Falls (last six months)             Mobility Comments: Uses cane for mobility ADLs Comments: Reports independence     Hand Dominance   Dominant Hand: Right    Extremity/Trunk Assessment   Upper Extremity Assessment Upper Extremity Assessment: Defer to OT evaluation LUE Deficits / Details: decreased coordination and mild weakness LUE Sensation: decreased light touch LUE Coordination: decreased fine motor    Lower Extremity Assessment Lower Extremity Assessment: LLE deficits/detail LLE Deficits / Details: Grossly 3/5 in hip flexors and 4/5 in knee extensors. Decreased coordination noted    Cervical / Trunk Assessment Cervical / Trunk Assessment: Normal  Communication   Communication: Expressive difficulties  Cognition Arousal/Alertness: Awake/alert Behavior During Therapy: WFL for tasks assessed/performed Overall Cognitive Status: Impaired/Different from baseline Area of Impairment: Attention, Following commands, Safety/judgement, Awareness, Problem solving                   Current Attention Level: Sustained   Following Commands: Follows one step commands consistently, Follows one step commands with increased time Safety/Judgement: Decreased awareness of safety, Decreased awareness of deficits Awareness: Emergent Problem Solving: Slow processing, Requires verbal cues General Comments: Pt with decreased awareness to safety and deficits, requiring increased time        General Comments General comments (skin integrity, edema, etc.): Pt's sister present throughout    Exercises     Assessment/Plan    PT Assessment Patient needs continued PT services  PT Problem List Decreased strength;Decreased activity tolerance;Decreased mobility;Decreased balance;Decreased cognition;Decreased knowledge of use of DME;Decreased safety awareness;Decreased knowledge of precautions       PT Treatment Interventions DME  instruction;Gait training;Functional mobility training;Stair training;Therapeutic activities;Therapeutic exercise;Balance training;Cognitive remediation;Patient/family education;Neuromuscular re-education    PT Goals (Current goals can be found in the Care Plan section)  Acute Rehab PT Goals Patient Stated Goal: to get stronger before going home PT Goal Formulation: With patient/family Time For Goal Achievement: 01/12/22 Potential to Achieve Goals: Good    Frequency Min 4X/week     Co-evaluation PT/OT/SLP Co-Evaluation/Treatment: Yes Reason for Co-Treatment: For patient/therapist safety;Necessary to address cognition/behavior during functional activity;To address functional/ADL transfers PT goals addressed during session: Balance;Mobility/safety with mobility         AM-PAC PT "6 Clicks" Mobility  Outcome Measure Help needed turning from your back to your side while in a flat bed without using bedrails?: None Help needed moving from lying on your back to sitting on the side of a flat bed without using bedrails?: None Help needed moving to and from a bed to a chair (including a wheelchair)?: A Lot Help needed standing up from a chair using your arms (e.g., wheelchair or bedside chair)?: A Lot Help needed to walk in hospital room?: Total Help needed climbing 3-5 steps with a railing? : Total 6 Click Score: 14    End of Session Equipment Utilized During Treatment: Gait belt Activity Tolerance: Patient tolerated treatment well Patient left: in bed;with call bell/phone within reach;with family/visitor present (in bed in ED) Nurse Communication: Mobility status PT Visit Diagnosis: Unsteadiness on feet (R26.81);Muscle weakness (generalized) (M62.81);Difficulty in walking, not elsewhere classified (R26.2)    Time: 1110-1130 PT Time Calculation (min) (ACUTE ONLY): 20 min   Charges:   PT Evaluation $PT Eval Moderate Complexity: 1 Mod  Lou Miner, DPT  Acute  Rehabilitation Services  Office: [phone]   Rudean Hitt 12/29/2021, 2:14 PM

## 2021-12-29 NOTE — ED Notes (Signed)
Pt placed on bedside commode, pt is restless, this RN placed him in recliner with chair alarm on. Pt is weak with standing

## 2021-12-29 NOTE — ED Notes (Signed)
The pt got up to the bedside commode without calling all leads off again

## 2021-12-29 NOTE — Progress Notes (Signed)
Inpatient Rehab Admissions Coordinator:   Per therapy recommendations, pt was screened for CIR candidacy by Shann Medal, PT, DPT.  At this time, pt appears to be a candidate and I will place an order per our protocol.  Contact me with questions.   Shann Medal, PT, DPT Admissions Coordinator [phone] 12/29/21  3:07 PM

## 2021-12-29 NOTE — ED Notes (Signed)
Pt readjusted in bed, given water. Pt is stable, vitals are WNL, and pt watching TV

## 2021-12-29 NOTE — Progress Notes (Signed)
PROGRESS NOTE   Chad Santos  DXI:[id number] DOB: [birth year] DOA: 12/28/2021 PCP: Pcp, No  Brief Narrative:  61 year old black male known history HTN, insomnia, radiculopathy, prior upper GI bleed 08/21/2021 secondary to Mallory-Weiss tear-colonoscopy showing 10 mm polyp ascending colon with diverticulosis Ultimately found to have H. 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 pylori Continue PPI Protonix twice daily Seems to have completed treatment for H. pylori? Lower extremity radiculopathy-on disability for the same Requires outpatient follow-up-trial Lyrica/gabapentin if deemed stable Cannabis use, recent change in residence Met sister at bedside-she was concerned of overt drug use-I counseled her that only cannabis was positive on his drug screen-she expressed concerns that he has been unable to take care of  himself over the past several months 6 weeks ago he moved into his deceased mother's home and had been living there alone-she tells me that her sister will be staying with the patient subsequent hospital stay and they are working the details around    DVT prophylaxis: Lovenox Code Status: Full Family Communication: D/W daughter Derrell Lolling at bedside phone number [phone] Disposition:  Status is: Inpatient Remains inpatient appropriate because: Needs further work-up   Consultants:  Neurology  Procedures:   Antimicrobials:     Subjective: Awake coherent dense plegia on left upper extremity and some drift on left side Dysdiadochokinesia present on the left side  some graphesthesia noted as well on the left side with past-pointing on left >right  Objective: Vitals:   12/28/21 2330 12/29/21 0015 12/29/21 0315 12/29/21 0400  BP: (!) 178/110 (!) 171/105 (!) 181/106 (!) 166/92  Pulse: 72 68 70 86  Resp: (!) 22 16 (!) 22 16  Temp:      SpO2: 98% 98% 97% 98%   No intake or output data in the 24 hours ending 12/29/21 0717 There were no vitals filed for this visit.  Examination:  EOMI Thick voice Neck soft supple S1-S2?  Murmur Abdomen soft no rebound no guarding Power 3/5 left upper and lower extremity, reflexes are brisk in the knees brachioradialis biceps Chest is clear Graphesthesia noted-dysdiadochokinesia is noted, gait not assessed  Data Reviewed: personally reviewed   CBC    Component Value Date/Time   WBC 8.1 12/28/2021 1834   RBC 4.62 12/28/2021 1834   HGB 14.6 12/28/2021 1846   HCT 43.0 12/28/2021 1846   PLT 236 12/28/2021 1834   MCV 91.1 12/28/2021 1834   MCH 30.3 12/28/2021 1834   MCHC 33.3 12/28/2021 1834   RDW 13.7 12/28/2021 1834   LYMPHSABS 2.2 12/28/2021 1834   MONOABS 0.6 12/28/2021 1834   EOSABS 0.1 12/28/2021 1834  BASOSABS 0.0 12/28/2021 1834      Latest Ref Rng & Units 12/28/2021    6:46 PM 12/28/2021    6:34 PM 08/23/2021    3:55  AM  CMP  Glucose 70 - 99 mg/dL 86  88  84   BUN 8 - 23 mg/dL _0 Creatinine 0.61 - 1.24 mg/dL 1.30  1.35  1.26   Sodium 135 - 145 mmol/L 139  139  139   Potassium 3.5 - 5.1 mmol/L 4.1  4.2  3.4   Chloride 98 - 111 mmol/L 101  103  107   CO2 22 - 32 mmol/L  26  24   Calcium 8.9 - 10.3 mg/dL  9.5  8.3   Total Protein 6.5 - 8.1 g/dL  6.8    Total Bilirubin 0.3 - 1.2 mg/dL  0.5    Alkaline Phos 38 - 126 U/L  94    AST 15 - 41 U/L  18    ALT 0 - 44 U/L  9       Radiology Studies: MR BRAIN WO CONTRAST  Result Date: 12/28/2021 CLINICAL DATA:  Left-sided weakness EXAM: MRI HEAD WITHOUT CONTRAST MRA HEAD WITHOUT CONTRAST TECHNIQUE: Multiplanar, multi-echo pulse sequences of the brain and surrounding structures were acquired without intravenous contrast. Angiographic images of the Circle of Willis were acquired using MRA technique without intravenous contrast. COMPARISON:  None Available. FINDINGS: MRI HEAD FINDINGS Brain: Small acute infarct along the posterior limb of the right internal capsule. No acute or chronic hemorrhage. There is multifocal hyperintense T2-weighted signal within the white matter. Generalized cerebral volume loss. 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 The midline structures are normal. Vascular: Major flow voids are preserved. Skull and upper cervical spine: Normal calvarium and skull base. Visualized upper cervical spine and soft tissues are normal. Sinuses/Orbits:No paranasal sinus fluid levels or advanced mucosal thickening. No mastoid or middle ear effusion. Normal orbits. MRA HEAD FINDINGS POSTERIOR CIRCULATION: --Vertebral arteries: Normal --Inferior cerebellar arteries: Normal. --Basilar artery: Normal. --Superior cerebellar arteries: Normal. --Posterior cerebral arteries: Normal. ANTERIOR CIRCULATION: --Intracranial internal carotid arteries: Normal. --Anterior cerebral arteries (ACA): Normal. --Middle cerebral arteries (MCA): Normal. ANATOMIC  VARIANTS: Both posterior communicating arteries are patent. IMPRESSION: 1. Small acute infarct along the posterior limb of the right internal capsule. No hemorrhage or mass effect. 2. Normal intracranial MRA. 3. 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 Electronically Signed   By: Ulyses Jarred M.D.   On: 12/28/2021 23:44   MR ANGIO HEAD WO CONTRAST  Result Date: 12/28/2021 CLINICAL DATA:  Left-sided weakness EXAM: MRI HEAD WITHOUT CONTRAST MRA HEAD WITHOUT CONTRAST TECHNIQUE: Multiplanar, multi-echo pulse sequences of the brain and surrounding structures were acquired without intravenous contrast. Angiographic images of the Circle of Willis were acquired using MRA technique without intravenous contrast. COMPARISON:  None Available. FINDINGS: MRI HEAD FINDINGS Brain: Small acute infarct along the posterior limb of the right internal capsule. No acute or chronic hemorrhage. There is multifocal hyperintense T2-weighted signal within the white matter. Generalized cerebral volume loss. 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 The midline structures are normal. Vascular: Major flow voids are preserved. Skull and upper cervical spine: Normal calvarium and skull base. Visualized upper cervical spine and soft tissues are normal. Sinuses/Orbits:No paranasal sinus fluid levels or advanced mucosal thickening. No mastoid or middle ear effusion. Normal orbits. MRA HEAD FINDINGS POSTERIOR CIRCULATION: --Vertebral arteries: Normal --Inferior cerebellar arteries: Normal. --  Basilar artery: Normal. --Superior cerebellar arteries: Normal. --Posterior cerebral arteries: Normal. ANTERIOR CIRCULATION: --Intracranial internal carotid arteries: Normal. --Anterior cerebral arteries (ACA): Normal. --Middle cerebral arteries (MCA): Normal. ANATOMIC VARIANTS: Both posterior communicating arteries are patent. IMPRESSION: 1. Small acute infarct along the posterior limb of the right internal capsule. No  hemorrhage or mass effect. 2. Normal intracranial MRA. 3. 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 Electronically Signed   By: Ulyses Jarred M.D.   On: 12/28/2021 23:44   DG Forearm Left  Result Date: 12/28/2021 CLINICAL DATA:  Rash on arm. EXAM: LEFT FOREARM - 2 VIEW COMPARISON:  None Available. FINDINGS: There is focal soft tissue swelling over the posterior aspect of the proximal ulna. There is no radiopaque foreign body or soft tissue gas collection. There is no underlying cortical erosion or periosteal reaction. No acute fracture or dislocation. Antecubital IV is present. IMPRESSION: 1. Soft tissue swelling of the posterior forearm. 2. No acute bony abnormality. Electronically Signed   By: Ronney Asters M.D.   On: 12/28/2021 22:22   DG Pelvis 1-2 Views  Result Date: 12/28/2021 CLINICAL DATA:  Stroke-like symptoms.  Rash on the left arm. EXAM: PELVIS - 1-2 VIEW COMPARISON:  None Available. FINDINGS: There is no acute fracture or dislocation. Moderate right and severe left hip osteoarthritic changes. There is near complete loss of left hip joint space with bone-on-bone contact. Excreted contrast noted within the urinary bladder outlining a slightly trabeculated appearing bladder wall, possibly related to chronic bladder outlet obstruction. The soft tissues are unremarkable. IMPRESSION: 1. No acute fracture or dislocation. 2. Moderate right and severe left hip osteoarthritic changes. Electronically Signed   By: Anner Crete M.D.   On: 12/28/2021 22:20   CT ANGIO HEAD NECK W WO CM  Result Date: 12/28/2021 CLINICAL DATA:  Acute neurologic deficit EXAM: CT HEAD WITHOUT CONTRAST CT ANGIOGRAPHY OF THE HEAD AND NECK TECHNIQUE: Contiguous axial images were obtained from the base of the skull through the vertex without intravenous contrast. Multidetector CT imaging of the head and neck was performed using the standard protocol during bolus administration of intravenous contrast. Multiplanar  CT image reconstructions and MIPs were obtained to evaluate the vascular anatomy. Carotid stenosis measurements (when applicable) are obtained utilizing NASCET criteria, using the distal internal carotid diameter as the denominator. RADIATION DOSE REDUCTION: This exam was performed according to the departmental dose-optimization program which includes automated exposure control, adjustment of the mA and/or kV according to patient size and/or use of iterative reconstruction technique. CONTRAST:  90m OMNIPAQUE IOHEXOL 350 MG/ML SOLN COMPARISON:  None Available. FINDINGS: CT HEAD FINDINGS Brain: There is no mass, hemorrhage or extra-axial collection. There is generalized atrophy without lobar predilection. There are old bilateral basal ganglia small vessel infarcts and left cerebellar infarct. There is hypoattenuation of the periventricular white matter, most commonly indicating chronic ischemic microangiopathy. Skull: The visualized skull base, calvarium and extracranial soft tissues are normal. Sinuses/Orbits: No fluid levels or advanced mucosal thickening of the visualized paranasal sinuses. No mastoid or middle ear effusion. The orbits are normal. CTA NECK FINDINGS SKELETON: There is no bony spinal canal stenosis. No lytic or blastic lesion. OTHER NECK: Normal pharynx, larynx and major salivary glands. No cervical lymphadenopathy. Unremarkable thyroid gland. UPPER CHEST: No pneumothorax or pleural effusion. No nodules or masses. AORTIC ARCH: There is calcific atherosclerosis of the aortic arch. There is no aneurysm, dissection or hemodynamically significant stenosis of the visualized portion of the aorta. Conventional 3 vessel aortic branching  pattern. The visualized proximal subclavian arteries are widely patent. RIGHT CAROTID SYSTEM: Normal without aneurysm, dissection or stenosis. LEFT CAROTID SYSTEM: Normal without aneurysm, dissection or stenosis. VERTEBRAL ARTERIES: Codominant configuration. Both origins are  clearly patent. There is no dissection, occlusion or flow-limiting stenosis to the skull base (V1-V3 segments). CTA HEAD FINDINGS POSTERIOR CIRCULATION: --Vertebral arteries: Normal V4 segments. --Inferior cerebellar arteries: Normal. --Basilar artery: Normal. --Superior cerebellar arteries: Normal. --Posterior cerebral arteries (PCA): Normal. ANTERIOR CIRCULATION: --Intracranial internal carotid arteries: Normal. --Anterior cerebral arteries (ACA): Normal. Both A1 segments are present. Patent anterior communicating artery (a-comm). --Middle cerebral arteries (MCA): Normal. VENOUS SINUSES: As permitted by contrast timing, patent. ANATOMIC VARIANTS: Fetal origin of the right posterior cerebral artery. Review of the MIP images confirms the above findings. IMPRESSION: 1. No emergent large vessel occlusion or hemodynamically significant stenosis of the head or neck. 2. 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 3. Aortic Atherosclerosis (ICD10-I70.0). Electronically Signed   By: Ulyses Jarred M.D.   On: 12/28/2021 20:17   CT HEAD WO CONTRAST (5MM)  Result Date: 12/28/2021 CLINICAL DATA:  Acute neurologic deficit EXAM: CT HEAD WITHOUT CONTRAST CT ANGIOGRAPHY OF THE HEAD AND NECK TECHNIQUE: Contiguous axial images were obtained from the base of the skull through the vertex without intravenous contrast. Multidetector CT imaging of the head and neck was performed using the standard protocol during bolus administration of intravenous contrast. Multiplanar CT image reconstructions and MIPs were obtained to evaluate the vascular anatomy. Carotid stenosis measurements (when applicable) are obtained utilizing NASCET criteria, using the distal internal carotid diameter as the denominator. RADIATION DOSE REDUCTION: This exam was performed according to the departmental dose-optimization program which includes automated exposure control, adjustment of the mA and/or kV according to patient size and/or use of  iterative reconstruction technique. CONTRAST:  85m OMNIPAQUE IOHEXOL 350 MG/ML SOLN COMPARISON:  None Available. FINDINGS: CT HEAD FINDINGS Brain: There is no mass, hemorrhage or extra-axial collection. There is generalized atrophy without lobar predilection. There are old bilateral basal ganglia small vessel infarcts and left cerebellar infarct. There is hypoattenuation of the periventricular white matter, most commonly indicating chronic ischemic microangiopathy. Skull: The visualized skull base, calvarium and extracranial soft tissues are normal. Sinuses/Orbits: No fluid levels or advanced mucosal thickening of the visualized paranasal sinuses. No mastoid or middle ear effusion. The orbits are normal. CTA NECK FINDINGS SKELETON: There is no bony spinal canal stenosis. No lytic or blastic lesion. OTHER NECK: Normal pharynx, larynx and major salivary glands. No cervical lymphadenopathy. Unremarkable thyroid gland. UPPER CHEST: No pneumothorax or pleural effusion. No nodules or masses. AORTIC ARCH: There is calcific atherosclerosis of the aortic arch. There is no aneurysm, dissection or hemodynamically significant stenosis of the visualized portion of the aorta. Conventional 3 vessel aortic branching pattern. The visualized proximal subclavian arteries are widely patent. RIGHT CAROTID SYSTEM: Normal without aneurysm, dissection or stenosis. LEFT CAROTID SYSTEM: Normal without aneurysm, dissection or stenosis. VERTEBRAL ARTERIES: Codominant configuration. Both origins are clearly patent. There is no dissection, occlusion or flow-limiting stenosis to the skull base (V1-V3 segments). CTA HEAD FINDINGS POSTERIOR CIRCULATION: --Vertebral arteries: Normal V4 segments. --Inferior cerebellar arteries: Normal. --Basilar artery: Normal. --Superior cerebellar arteries: Normal. --Posterior cerebral arteries (PCA): Normal. ANTERIOR CIRCULATION: --Intracranial internal carotid arteries: Normal. --Anterior cerebral arteries  (ACA): Normal. Both A1 segments are present. Patent anterior communicating artery (a-comm). --Middle cerebral arteries (MCA): Normal. VENOUS SINUSES: As permitted by contrast timing, patent. ANATOMIC VARIANTS: Fetal origin of the right posterior cerebral artery. Review of the  MIP images confirms the above findings. IMPRESSION: 1. No emergent large vessel occlusion or hemodynamically significant stenosis of the head or neck. 2. 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 3. Aortic Atherosclerosis (ICD10-I70.0). Electronically Signed   By: Ulyses Jarred M.D.   On: 12/28/2021 20:17     Scheduled Meds:   stroke: early stages of recovery book   Does not apply Once   atorvastatin  80 mg Oral QHS   bacitracin  1 application. Topical BID   enoxaparin (LOVENOX) injection  40 mg Subcutaneous Q24H   pantoprazole (PROTONIX) IV  40 mg Intravenous Q24H   Continuous Infusions:  sodium chloride 75 mL/hr at 12/29/21 0045     LOS: 1 day   Time spent: [place], MD [facility] To contact the attending provider between 7A-7P or the covering provider during after hours 7P-7A, please log into the web site [email or web address] and access using universal [place] password for that web site. If you do not have the password, please call the hospital operator.  12/29/2021, 7:17 AM

## 2021-12-30 DIAGNOSIS — I639 Cerebral infarction, unspecified: Secondary | ICD-10-CM | POA: Diagnosis not present

## 2021-12-30 LAB — BASIC METABOLIC PANEL
Anion gap: 10 (ref 5–15)
BUN: 10 mg/dL (ref 8–23)
CO2: 26 mmol/L (ref 22–32)
Calcium: 9.1 mg/dL (ref 8.9–10.3)
Chloride: 101 mmol/L (ref 98–111)
Creatinine, Ser: 1.27 mg/dL — ABNORMAL HIGH (ref 0.61–1.24)
GFR, Estimated: >60 mL/min (ref >60)
Glucose, Bld: 103 mg/dL — ABNORMAL HIGH (ref 70–99)
Potassium: 3.6 mmol/L (ref 3.5–5.1)
Sodium: 137 mmol/L (ref 135–145)

## 2021-12-30 NOTE — Evaluation (Signed)
Speech Language Pathology Evaluation Patient Details Name: Chad Santos MRN: [id number] DOB: June 15, 1961 Today's Date: 12/30/2021 Time: 1120-1150 SLP Time Calculation (min) (ACUTE ONLY): 30 min  Problem List:  Patient Active Problem List   Diagnosis Date Noted   Acute CVA (cerebrovascular accident) ([place]) 12/28/2021   Upper GI bleeding 08/21/2021   Essential hypertension 08/21/2021   Elevated troponin 08/21/2021   Bilateral leg pain 08/21/2021   Agitation 08/21/2021   Blood in the stool    Normocytic anemia    Past Medical History:  Past Medical History:  Diagnosis Date   Hypertension    Past Surgical History:  Past Surgical History:  Procedure Laterality Date   BIOPSY  08/23/2021   Procedure: BIOPSY;  Surgeon: [name], MD;  Location: [place];  Service: Gastroenterology;;   COLONOSCOPY WITH PROPOFOL N/A 08/23/2021   Procedure: COLONOSCOPY WITH PROPOFOL;  Surgeon: [name], MD;  Location: [place];  Service: Gastroenterology;  Laterality: N/A;   ESOPHAGOGASTRODUODENOSCOPY (EGD) WITH PROPOFOL N/A 08/23/2021   Procedure: ESOPHAGOGASTRODUODENOSCOPY (EGD) WITH PROPOFOL;  Surgeon: [name], MD;  Location: [place];  Service: Gastroenterology;  Laterality: N/A;   HAND SURGERY Right    POLYPECTOMY  08/23/2021   Procedure: POLYPECTOMY;  Surgeon: [name], MD;  Location: [facility] ENDOSCOPY;  Service: Gastroenterology;;   HPI:  61 y.o. M admitted on 12/28/21 due to slurred speech and L sided weakness. 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 PMH significant for  peptic ulcer disease with GI bleed, hypertension, tobacco abuse.   Assessment / Plan / Recommendation Clinical Impression  Pt demonstrates moderate cognitive impiarment; pt with left visual field inattention and gaze deviation left; poor visualspatial awareness, unable to write single number or words. Pt is impulsive, comprehends commands and two step  commands but piles instructions up on top of another with poor sequencing, impairing safety and accuracy with basic tasks like self feeding. Short term memory and awareness also impaired. Pt scored a 13/ 30 on the SLUMS. Pt also with moderate dysarthria with repetition and revision needed at word and phrase level. Pt reports intermittent supervision available from sister at home, may need full supervision unless function gains are made in an AIR setting.    SLP Assessment  SLP Recommendation/Assessment: Patient needs continued Speech [place] [facility] SLP Visit Diagnosis: Cognitive communication deficit (R41.841)    Recommendations for follow up therapy are one component of a multi-disciplinary discharge planning process, led by the attending physician.  Recommendations may be updated based on patient status, additional functional criteria and insurance authorization.    Follow Up Recommendations  Acute inpatient rehab (3hours/day)    Assistance Recommended at Discharge  Frequent or constant Supervision/Assistance  Functional Status Assessment Patient has had a recent decline in their functional status and demonstrates the ability to make significant improvements in function in a reasonable and predictable amount of time.  Frequency and Duration min 2x/week  2 weeks      SLP Evaluation Cognition  Overall Cognitive Status: Impaired/Different from baseline Arousal/Alertness: Awake/alert Orientation Level: Oriented X4 Attention: Focused;Sustained;Selective Focused Attention: Appears intact Sustained Attention: Appears intact Selective Attention: Impaired Selective Attention Impairment: Verbal basic;Functional basic Memory: Impaired Memory Impairment: Storage deficit;Retrieval deficit Awareness: Impaired Awareness Impairment: Emergent impairment;Anticipatory impairment Problem Solving: Impaired Problem Solving Impairment: Functional basic Executive Function: Reasoning;Self  Monitoring;Self Correcting Reasoning: Impaired Reasoning Impairment: Functional basic Self Monitoring: Impaired Self Monitoring Impairment: Functional basic Self Correcting: Impaired Self Correcting Impairment: Functional basic  Behaviors: Impulsive       Comprehension  Auditory Comprehension Overall Auditory Comprehension: Appears within functional limits for tasks assessed    Expression Verbal Expression Overall Verbal Expression: Appears within functional limits for tasks assessed Written Expression Dominant Hand: Right   Oral / Motor  Oral Motor/Sensory Function Overall Oral Motor/Sensory Function: Moderate impairment Facial ROM: Reduced left;Suspected CN VII (facial) dysfunction Facial Symmetry: Abnormal symmetry left;Suspected CN VII (facial) dysfunction Facial Strength: Reduced left;Suspected CN VII (facial) dysfunction Facial Sensation: Reduced left;Suspected CN V (Trigeminal) dysfunction Lingual ROM: Reduced left;Suspected CN XII (hypoglossal) dysfunction Lingual Symmetry: Abnormal symmetry left;Suspected CN XII (hypoglossal) dysfunction Lingual Strength: Reduced;Suspected CN XII (hypoglossal) dysfunction Lingual Sensation: Within Functional Limits Velum: Within Functional Limits Mandible: Within Functional Limits Motor Speech Overall Motor Speech: Impaired Respiration: Within functional limits Phonation: Low vocal intensity Resonance: Within functional limits Articulation: Impaired Level of Impairment: Phrase Intelligibility: Intelligibility reduced Word: 50-74% accurate Phrase: 50-74% accurate Sentence: 50-74% accurate Conversation: 50-74% accurate            Betzaira Mentel, Katherene Ponto 12/30/2021, 12:22 PM

## 2021-12-30 NOTE — Progress Notes (Signed)
PROGRESS NOTE   Chad Santos  OZD:[id number] DOB: Mar 19, 1961 DOA: 12/28/2021 PCP: Pcp, No  Brief Narrative:  61 year old black male known history HTN, insomnia, radiculopathy, prior upper GI bleed 08/21/2021 secondary to Mallory-Weiss tear-colonoscopy showing 10 mm polyp ascending colon with diverticulosis Ultimately found to have H. 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 pylori Continue PPI Protonix twice daily Seems to have completed treatment for H. pylori? Lower extremity radiculopathy-on disability for the same Requires outpatient follow-up-trial Lyrica/gabapentin if deemed stable Cannabis use, recent change in residence Met sister at bedside-she was concerned of overt drug use-I counseled her that only cannabis was positive on his drug screen  DVT prophylaxis: Lovenox Code Status: Full Family Communication:  D/W sister Derrell Lolling 6/8 phone number [phone] Disposition:  Status is: Inpatient Remains inpatient appropriate because: Needs further work-up   Consultants:  Neurology  Procedures:   Antimicrobials:     Subjective:  Sitting in chair eating Seems to be pocketing foods some with slow transit  Objective: Vitals:   12/30/21 0323 12/30/21 0914 12/30/21 0915 12/30/21 1317  BP: (!) 160/106 (!) 172/119 (!) 182/119 (!) 154/105  Pulse: 87 72 67 75  Resp: 17 19  17   Temp: 98.8 F (37.1 C) 98.1 F (36.7 C)  99.1 F (37.3 C)  TempSrc: Oral Oral  Oral  SpO2: 96% 100%  99%  Weight:      Height:        Intake/Output Summary (Last 24 hours) at 12/30/2021 1323 Last data filed at 12/29/2021 2000 Gross per 24 hour  Intake 1000 ml  Output 250 ml  Net 750 ml   Filed Weights   12/29/21 1832  Weight: 75.4 kg    Examination:  Dense deficit on L side  Quiet speech--eomi Some deficit to vision on L Neck soft Seems to be pocketing Abd soft Sensory grossly intact   Data Reviewed: personally reviewed   CBC    Component Value Date/Time   WBC 8.1 12/28/2021 1834   RBC 4.62 12/28/2021 1834   HGB 14.6 12/28/2021 1846   HCT 43.0 12/28/2021 1846   PLT 236 12/28/2021 1834   MCV 91.1 12/28/2021 1834   MCH 30.3 12/28/2021 1834   MCHC 33.3 12/28/2021 1834   RDW 13.7 12/28/2021 1834   LYMPHSABS 2.2 12/28/2021 1834   MONOABS 0.6 12/28/2021 1834   EOSABS 0.1 12/28/2021 1834   BASOSABS 0.0 12/28/2021 1834      Latest Ref Rng & Units 12/30/2021    2:57 AM 12/28/2021    6:46 PM 12/28/2021    6:34 PM  CMP  Glucose 70 - 99 mg/dL 103  86  88   BUN 8 - 23 mg/dL 10  19  16    Creatinine 0.61 - 1.24 mg/dL 1.27  1.30  1.35   Sodium 135 - 145 mmol/L 137  139  139   Potassium 3.5 - 5.1 mmol/L 3.6  4.1  4.2   Chloride 98 - 111 mmol/L 101  101  103   CO2 22 - 32 mmol/L 26   26   Calcium 8.9 - 10.3 mg/dL 9.1   9.5   Total Protein 6.5 - 8.1 g/dL   6.8   Total Bilirubin 0.3 - 1.2 mg/dL    0.5   Alkaline Phos 38 - 126 U/L   94   AST 15 - 41 U/L   18   ALT 0 - 44 U/L   9      Radiology Studies: ECHOCARDIOGRAM COMPLETE  Result Date: 12/29/2021    ECHOCARDIOGRAM REPORT   Patient Name:   Chad Santos Date of Exam: 12/29/2021 Medical Rec #:  [id number]        Height:       73.0 in Accession #:    [id number]       Weight:       174.0 lb Date of Birth:  February 29, 1961         BSA:          2.028 m Patient Age:    69 years         BP:           166/92 mmHg Patient Gender: M                HR:           66 bpm. Exam Location:  Inpatient Procedure: 2D Echo, Cardiac Doppler and Color Doppler Indications:    Stroke  History:        Patient has no prior history of Echocardiogram examinations.                 Risk Factors:Hypertension.  Sonographer:    Jyl Heinz Referring Phys: [place]  1. Left ventricular ejection fraction, by estimation, is 45 to 50%. The left ventricle has mildly decreased function. The left ventricle demonstrates global hypokinesis. There is mild concentric left ventricular hypertrophy. Left ventricular diastolic parameters are consistent with Grade I diastolic dysfunction (impaired relaxation).  2. Right ventricular systolic function is normal. The right ventricular size is normal. There is normal pulmonary artery systolic pressure.  3. The mitral valve is normal in structure. Trivial mitral valve regurgitation. No evidence of mitral stenosis.  4. The aortic valve is tricuspid. Aortic valve regurgitation is not visualized. No aortic stenosis is present.  5. Aortic dilatation noted. There is mild dilatation of the aortic root, measuring 39 mm.  6. The inferior vena cava is normal in size with greater than 50% respiratory variability, suggesting right atrial pressure of 3 mmHg. FINDINGS  Left Ventricle: Left ventricular ejection fraction, by estimation, is 45 to 50%. The left ventricle has mildly decreased function. The left ventricle demonstrates global  hypokinesis. The left ventricular internal cavity size was normal in size. There is  mild concentric left ventricular hypertrophy. Left ventricular diastolic parameters are consistent with Grade I diastolic dysfunction (impaired relaxation). Indeterminate filling pressures. Right Ventricle: The right ventricular size is normal. No increase in right ventricular wall thickness. Right ventricular systolic function is normal. There is normal pulmonary artery systolic pressure. The tricuspid regurgitant velocity is 1.57 m/s, and  with an assumed right atrial pressure of 3 mmHg, the estimated right ventricular systolic pressure is 43.1 mmHg. Left Atrium: Left atrial size was normal in size. Right Atrium: Right atrial size was normal in size. Pericardium: There is no evidence of pericardial effusion. Mitral Valve: The mitral valve is normal in structure. Trivial mitral valve regurgitation. No evidence of mitral valve stenosis. Tricuspid Valve: The tricuspid valve is normal in structure. Tricuspid valve regurgitation is trivial. No evidence of tricuspid stenosis. Aortic Valve: The aortic valve is tricuspid. Aortic valve regurgitation is not visualized. No aortic stenosis is present. Aortic valve peak gradient measures 8.0 mmHg. Pulmonic Valve: The pulmonic valve was normal in structure. Pulmonic valve regurgitation is trivial. No evidence of pulmonic stenosis. Aorta: Aortic dilatation noted. There is mild dilatation of the aortic root, measuring 39 mm. Venous: The inferior vena cava is normal in size with greater than 50% respiratory variability, suggesting right atrial pressure of 3 mmHg. IAS/Shunts: No atrial level shunt detected by color flow Doppler.  LEFT VENTRICLE PLAX 2D LVIDd:         4.70 cm      Diastology LVIDs:         3.50 cm      LV e' medial:    4.35 cm/s LV PW:         1.10 cm      LV E/e' medial:  11.7 LV IVS:        1.20 cm      LV e' lateral:   5.77 cm/s LVOT diam:     2.00 cm      LV E/e' lateral: 8.9 LV  SV:         64 LV SV Index:   31 LVOT Area:     3.14 cm  LV Volumes (MOD) LV vol d, MOD A2C: 146.0 ml LV vol d, MOD A4C: 151.0 ml LV vol s, MOD A2C: 73.4 ml LV vol s, MOD A4C: 86.7 ml LV SV MOD A2C:     72.6 ml LV SV MOD A4C:     151.0 ml LV SV MOD BP:      68.9 ml RIGHT VENTRICLE             IVC RV Basal diam:  2.60 cm     IVC diam: 1.60 cm RV Mid diam:    2.20 cm RV S prime:     10.60 cm/s TAPSE (M-mode): 2.0 cm LEFT ATRIUM             Index        RIGHT ATRIUM           Index LA diam:        3.60 cm 1.78 cm/m   RA Area:     15.30 cm LA Vol (A2C):   41.6 ml 20.51 ml/m  RA Volume:   37.30 ml  18.39 ml/m LA Vol (A4C):   39.7 ml 19.58 ml/m LA Biplane Vol: 41.3 ml 20.37 ml/m  AORTIC VALVE AV Area (Vmax): 2.63 cm AV Vmax:        141.00 cm/s AV Peak Grad:   8.0 mmHg LVOT Vmax:      118.00 cm/s LVOT Vmean:     80.200 cm/s LVOT VTI:       0.203 m  AORTA Ao Root diam: 3.90 cm Ao Asc diam:  3.50 cm MITRAL VALVE               TRICUSPID VALVE MV Area (PHT):  3.60 cm    TR Peak grad:   9.9 mmHg MV Decel Time: 211 msec    TR Vmax:        157.00 cm/s MV E velocity: 51.10 cm/s MV A velocity: 72.00 cm/s  SHUNTS MV E/A ratio:  0.71        Systemic VTI:  0.20 m                            Systemic Diam: 2.00 cm Skeet Latch MD Electronically signed by Skeet Latch MD Signature Date/Time: 12/29/2021/5:53:40 PM    Final    MR BRAIN WO CONTRAST  Result Date: 12/28/2021 CLINICAL DATA:  Left-sided weakness EXAM: MRI HEAD WITHOUT CONTRAST MRA HEAD WITHOUT CONTRAST TECHNIQUE: Multiplanar, multi-echo pulse sequences of the brain and surrounding structures were acquired without intravenous contrast. Angiographic images of the Circle of Willis were acquired using MRA technique without intravenous contrast. COMPARISON:  None Available. FINDINGS: MRI HEAD FINDINGS Brain: Small acute infarct along the posterior limb of the right internal capsule. No acute or chronic hemorrhage. There is multifocal hyperintense T2-weighted signal  within the white matter. Generalized cerebral volume loss. 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 The midline structures are normal. Vascular: Major flow voids are preserved. Skull and upper cervical spine: Normal calvarium and skull base. Visualized upper cervical spine and soft tissues are normal. Sinuses/Orbits:No paranasal sinus fluid levels or advanced mucosal thickening. No mastoid or middle ear effusion. Normal orbits. MRA HEAD FINDINGS POSTERIOR CIRCULATION: --Vertebral arteries: Normal --Inferior cerebellar arteries: Normal. --Basilar artery: Normal. --Superior cerebellar arteries: Normal. --Posterior cerebral arteries: Normal. ANTERIOR CIRCULATION: --Intracranial internal carotid arteries: Normal. --Anterior cerebral arteries (ACA): Normal. --Middle cerebral arteries (MCA): Normal. ANATOMIC VARIANTS: Both posterior communicating arteries are patent. IMPRESSION: 1. Small acute infarct along the posterior limb of the right internal capsule. No hemorrhage or mass effect. 2. Normal intracranial MRA. 3. 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 Electronically Signed   By: Ulyses Jarred M.D.   On: 12/28/2021 23:44   MR ANGIO HEAD WO CONTRAST  Result Date: 12/28/2021 CLINICAL DATA:  Left-sided weakness EXAM: MRI HEAD WITHOUT CONTRAST MRA HEAD WITHOUT CONTRAST TECHNIQUE: Multiplanar, multi-echo pulse sequences of the brain and surrounding structures were acquired without intravenous contrast. Angiographic images of the Circle of Willis were acquired using MRA technique without intravenous contrast. COMPARISON:  None Available. FINDINGS: MRI HEAD FINDINGS Brain: Small acute infarct along the posterior limb of the right internal capsule. No acute or chronic hemorrhage. There is multifocal hyperintense T2-weighted signal within the white matter. Generalized cerebral volume loss. 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 The midline structures are normal.  Vascular: Major flow voids are preserved. Skull and upper cervical spine: Normal calvarium and skull base. Visualized upper cervical spine and soft tissues are normal. Sinuses/Orbits:No paranasal sinus fluid levels or advanced mucosal thickening. No mastoid or middle ear effusion. Normal orbits. MRA HEAD FINDINGS POSTERIOR CIRCULATION: --Vertebral arteries: Normal --Inferior cerebellar arteries: Normal. --Basilar artery: Normal. --Superior cerebellar arteries: Normal. --Posterior cerebral arteries: Normal. ANTERIOR CIRCULATION: --Intracranial internal carotid arteries: Normal. --Anterior cerebral arteries (ACA): Normal. --Middle cerebral arteries (MCA): Normal. ANATOMIC VARIANTS: Both posterior communicating arteries are patent. IMPRESSION: 1. Small acute infarct along the posterior limb of the right internal capsule. No hemorrhage or mass effect. 2. Normal intracranial MRA. 3. 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 Electronically Signed   By: Lennette Bihari  Collins [place] M.D.   On: 12/28/2021 23:44   DG Forearm Left  Result Date: 12/28/2021 CLINICAL DATA:  Rash on arm. EXAM: LEFT FOREARM - 2 VIEW COMPARISON:  None Available. FINDINGS: There is focal soft tissue swelling over the posterior aspect of the proximal ulna. There is no radiopaque foreign body or soft tissue gas collection. There is no underlying cortical erosion or periosteal reaction. No acute fracture or dislocation. Antecubital IV is present. IMPRESSION: 1. Soft tissue swelling of the posterior forearm. 2. No acute bony abnormality. Electronically Signed   By: Ronney Asters M.D.   On: 12/28/2021 22:22   DG Pelvis 1-2 Views  Result Date: 12/28/2021 CLINICAL DATA:  Stroke-like symptoms.  Rash on the left arm. EXAM: PELVIS - 1-2 VIEW COMPARISON:  None Available. FINDINGS: There is no acute fracture or dislocation. Moderate right and severe left hip osteoarthritic changes. There is near complete loss of left hip joint space with bone-on-bone  contact. Excreted contrast noted within the urinary bladder outlining a slightly trabeculated appearing bladder wall, possibly related to chronic bladder outlet obstruction. The soft tissues are unremarkable. IMPRESSION: 1. No acute fracture or dislocation. 2. Moderate right and severe left hip osteoarthritic changes. Electronically Signed   By: Anner Crete M.D.   On: 12/28/2021 22:20   CT ANGIO HEAD NECK W WO CM  Result Date: 12/28/2021 CLINICAL DATA:  Acute neurologic deficit EXAM: CT HEAD WITHOUT CONTRAST CT ANGIOGRAPHY OF THE HEAD AND NECK TECHNIQUE: Contiguous axial images were obtained from the base of the skull through the vertex without intravenous contrast. Multidetector CT imaging of the head and neck was performed using the standard protocol during bolus administration of intravenous contrast. Multiplanar CT image reconstructions and MIPs were obtained to evaluate the vascular anatomy. Carotid stenosis measurements (when applicable) are obtained utilizing NASCET criteria, using the distal internal carotid diameter as the denominator. RADIATION DOSE REDUCTION: This exam was performed according to the departmental dose-optimization program which includes automated exposure control, adjustment of the mA and/or kV according to patient size and/or use of iterative reconstruction technique. CONTRAST:  21m OMNIPAQUE IOHEXOL 350 MG/ML SOLN COMPARISON:  None Available. FINDINGS: CT HEAD FINDINGS Brain: There is no mass, hemorrhage or extra-axial collection. There is generalized atrophy without lobar predilection. There are old bilateral basal ganglia small vessel infarcts and left cerebellar infarct. There is hypoattenuation of the periventricular white matter, most commonly indicating chronic ischemic microangiopathy. Skull: The visualized skull base, calvarium and extracranial soft tissues are normal. Sinuses/Orbits: No fluid levels or advanced mucosal thickening of the visualized paranasal sinuses. No  mastoid or middle ear effusion. The orbits are normal. CTA NECK FINDINGS SKELETON: There is no bony spinal canal stenosis. No lytic or blastic lesion. OTHER NECK: Normal pharynx, larynx and major salivary glands. No cervical lymphadenopathy. Unremarkable thyroid gland. UPPER CHEST: No pneumothorax or pleural effusion. No nodules or masses. AORTIC ARCH: There is calcific atherosclerosis of the aortic arch. There is no aneurysm, dissection or hemodynamically significant stenosis of the visualized portion of the aorta. Conventional 3 vessel aortic branching pattern. The visualized proximal subclavian arteries are widely patent. RIGHT CAROTID SYSTEM: Normal without aneurysm, dissection or stenosis. LEFT CAROTID SYSTEM: Normal without aneurysm, dissection or stenosis. VERTEBRAL ARTERIES: Codominant configuration. Both origins are clearly patent. There is no dissection, occlusion or flow-limiting stenosis to the skull base (V1-V3 segments). CTA HEAD FINDINGS POSTERIOR CIRCULATION: --Vertebral arteries: Normal V4 segments. --Inferior cerebellar arteries: Normal. --Basilar artery: Normal. --Superior cerebellar arteries: Normal. --Posterior cerebral arteries (PCA): Normal. ANTERIOR  CIRCULATION: --Intracranial internal carotid arteries: Normal. --Anterior cerebral arteries (ACA): Normal. Both A1 segments are present. Patent anterior communicating artery (a-comm). --Middle cerebral arteries (MCA): Normal. VENOUS SINUSES: As permitted by contrast timing, patent. ANATOMIC VARIANTS: Fetal origin of the right posterior cerebral artery. Review of the MIP images confirms the above findings. IMPRESSION: 1. No emergent large vessel occlusion or hemodynamically significant stenosis of the head or neck. 2. 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 3. Aortic Atherosclerosis (ICD10-I70.0). Electronically Signed   By: Ulyses Jarred M.D.   On: 12/28/2021 20:17   CT HEAD WO CONTRAST (5MM)  Result Date: 12/28/2021 CLINICAL  DATA:  Acute neurologic deficit EXAM: CT HEAD WITHOUT CONTRAST CT ANGIOGRAPHY OF THE HEAD AND NECK TECHNIQUE: Contiguous axial images were obtained from the base of the skull through the vertex without intravenous contrast. Multidetector CT imaging of the head and neck was performed using the standard protocol during bolus administration of intravenous contrast. Multiplanar CT image reconstructions and MIPs were obtained to evaluate the vascular anatomy. Carotid stenosis measurements (when applicable) are obtained utilizing NASCET criteria, using the distal internal carotid diameter as the denominator. RADIATION DOSE REDUCTION: This exam was performed according to the departmental dose-optimization program which includes automated exposure control, adjustment of the mA and/or kV according to patient size and/or use of iterative reconstruction technique. CONTRAST:  49m OMNIPAQUE IOHEXOL 350 MG/ML SOLN COMPARISON:  None Available. FINDINGS: CT HEAD FINDINGS Brain: There is no mass, hemorrhage or extra-axial collection. There is generalized atrophy without lobar predilection. There are old bilateral basal ganglia small vessel infarcts and left cerebellar infarct. There is hypoattenuation of the periventricular white matter, most commonly indicating chronic ischemic microangiopathy. Skull: The visualized skull base, calvarium and extracranial soft tissues are normal. Sinuses/Orbits: No fluid levels or advanced mucosal thickening of the visualized paranasal sinuses. No mastoid or middle ear effusion. The orbits are normal. CTA NECK FINDINGS SKELETON: There is no bony spinal canal stenosis. No lytic or blastic lesion. OTHER NECK: Normal pharynx, larynx and major salivary glands. No cervical lymphadenopathy. Unremarkable thyroid gland. UPPER CHEST: No pneumothorax or pleural effusion. No nodules or masses. AORTIC ARCH: There is calcific atherosclerosis of the aortic arch. There is no aneurysm, dissection or  hemodynamically significant stenosis of the visualized portion of the aorta. Conventional 3 vessel aortic branching pattern. The visualized proximal subclavian arteries are widely patent. RIGHT CAROTID SYSTEM: Normal without aneurysm, dissection or stenosis. LEFT CAROTID SYSTEM: Normal without aneurysm, dissection or stenosis. VERTEBRAL ARTERIES: Codominant configuration. Both origins are clearly patent. There is no dissection, occlusion or flow-limiting stenosis to the skull base (V1-V3 segments). CTA HEAD FINDINGS POSTERIOR CIRCULATION: --Vertebral arteries: Normal V4 segments. --Inferior cerebellar arteries: Normal. --Basilar artery: Normal. --Superior cerebellar arteries: Normal. --Posterior cerebral arteries (PCA): Normal. ANTERIOR CIRCULATION: --Intracranial internal carotid arteries: Normal. --Anterior cerebral arteries (ACA): Normal. Both A1 segments are present. Patent anterior communicating artery (a-comm). --Middle cerebral arteries (MCA): Normal. VENOUS SINUSES: As permitted by contrast timing, patent. ANATOMIC VARIANTS: Fetal origin of the right posterior cerebral artery. Review of the MIP images confirms the above findings. IMPRESSION: 1. No emergent large vessel occlusion or hemodynamically significant stenosis of the head or neck. 2. 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 3. Aortic Atherosclerosis (ICD10-I70.0). Electronically Signed   By: KUlyses JarredM.D.   On: 12/28/2021 20:17     Scheduled Meds:   stroke: early stages of recovery book   Does not apply Once   atorvastatin  80 mg Oral QHS  bacitracin  1 application  Topical BID   clopidogrel  75 mg Oral Daily   enoxaparin (LOVENOX) injection  40 mg Subcutaneous Q24H   nicotine  21 mg Transdermal Daily   pantoprazole (PROTONIX) IV  40 mg Intravenous Q24H   Continuous Infusions:     LOS: 2 days   Time spent: [place], MD [facility] To contact the attending provider between 7A-7P or the  covering provider during after hours 7P-7A, please log into the web site [email or web address] and access using universal [place] password for that web site. If you do not have the password, please call the hospital operator.  12/30/2021, 1:23 PM

## 2021-12-30 NOTE — Progress Notes (Addendum)
STROKE TEAM PROGRESS NOTE   INTERVAL HISTORY No family at the bedside. Sitting up in the chair.  Echo shows diminished ejection fraction of 45 to 50% with mild global hypokinesis.  Therapist recommend inpatient rehab His dysarthria is improved but continues to have facial weakness and left hemiparesis.  Vital signs are stable.   Neurological exam is unchanged. Vitals:   12/30/21 0323 12/30/21 0914 12/30/21 0915 12/30/21 1317  BP: (!) 160/106 (!) 172/119 (!) 182/119 (!) 154/105  Pulse: 87 72 67 75  Resp: '17 19  17  '$ Temp: 98.8 F (37.1 C) 98.1 F (36.7 C)  99.1 F (37.3 C)  TempSrc: Oral Oral  Oral  SpO2: 96% 100%  99%  Weight:      Height:       CBC:  Recent Labs  Lab 12/28/21 1834 12/28/21 1846  WBC 8.1  --   NEUTROABS 5.2  --   HGB 14.0 14.6  HCT 42.1 43.0  MCV 91.1  --   PLT 236  --    Basic Metabolic Panel:  Recent Labs  Lab 12/28/21 1834 12/28/21 1846 12/30/21 0257  NA 139 139 137  K 4.2 4.1 3.6  CL 103 101 101  CO2 26  --  26  GLUCOSE 88 86 103*  BUN '16 19 10  '$ CREATININE 1.35* 1.30* 1.27*  CALCIUM 9.5  --  9.1   Lipid Panel:  Recent Labs  Lab 12/28/21 1844  CHOL 189  TRIG 86  HDL 56  CHOLHDL 3.4  VLDL 17  LDLCALC 116*   HgbA1c:  Recent Labs  Lab 12/28/21 1844  HGBA1C 6.1*   Urine Drug Screen:  Recent Labs  Lab 12/28/21 2037  LABOPIA NONE DETECTED  COCAINSCRNUR NONE DETECTED  LABBENZ NONE DETECTED  AMPHETMU NONE DETECTED  THCU POSITIVE*  LABBARB NONE DETECTED    Alcohol Level  Recent Labs  Lab 12/28/21 1834  ETH <10    IMAGING past 24 hours No results found.  PHYSICAL EXAM GENERAL: Awake, alert, pleasant middle-aged African-American male in no acute distress LUNGS: Normal respiratory effort. Non-labored breathing on room air CV: Regular rate and rhythm on telemetry   NEURO:  Mental Status: Awake, alert, and oriented to person, place, time, and situation. He is able to provide a clear and coherent history of present  illness. Speech/Language: speech is dysarthric.   Naming, repetition, fluency, and comprehension intact without aphasia. No neglect is noted Cranial Nerves:  II: PERRL 64m/brisk. visual fields full.  III, IV, VI: EOMI. Lid elevation symmetric and full.  V: Sensation is intact to light touch and symmetrical to face. Blinks to threat. Moves jaw back and forth.  VII: Left facial droop VIII: Hearing intact to voice IX, X: Palate elevation is symmetric. Phonation normal.  XI: Normal sternocleidomastoid and trapezius muscle strength XII: Tongue is minimally deviated to the left Motor: Tone is normal. Bulk is normal.  RUE 5/5          LUE 4+/5 with subtle drift RLE 5/5           LLE 4/5 with drift Sensation: Intact to light touch bilaterally in all four extremities. No extinction to DSS present.  Coordination: FTN intact bilaterally. HKS intact bilaterally. No pronator drift. Alternating hand movements slowed on the left.  DTRs: 2+ throughout.  Gait: Deferred  ASSESSMENT/PLAN Mr. GTaren Toopsis a 61y.o. male with history of recent GI bleed and HTN presenting to the hospital today with dysarthria, left side upper and lower  extremity weakness, and left facial droop. He states that at 0100 in the morning he fell in the bathroom and he called his family who arrived and found him to have slurred speech, left side weakness, and a facial droop.   Stroke:  Small acute lacunar infarct in the posterior limb of the right internal capsule likely secondary small vessel disease source given uncontrolled risk factors and smoking Code Stroke CT head No acute abnormality.  CTA head & neck No LVO MRI  Small acute infarct along the posterior limb of the right internal capsule MRA  Normal intracranial MRA. 2D Echo pending LDL 116 HgbA1c 6.1 VTE prophylaxis - SCDs    Diet   Diet Heart Room service appropriate? Yes; Fluid consistency: Thin   No antithrombotic prior to admission, now on aspirin 81 mg and  clopidogrel 75 mg daily.  For 3 weeks and then single agent Plavix 75 mg daily alone.  Therapy recommendations:  pending Disposition:  pending  Hypertension Home meds: Losartan potassium Stable Permissive hypertension (OK if < 220/120) but gradually normalize in 5-7 days Long-term BP goal normotensive  Hyperlipidemia LDL 116, goal < 70 Add atorvastatin '80mg'$   High intensity statin not indicated  Continue statin at discharge  Other Stroke Risk Factors Cigarette smoker 1ppd, advised to stop smoking ETOH use, alcohol level <10, advised to drink no more than 2 drink(s) a day Substance abuse - UDS:  THC POSITIVE, Cocaine NONE DETECTED. Patient advised to stop using due to stroke risk. 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  Other Active Problems GI Bleed- 1/31 Home med: Pantoprazole Underwent EGD with GI that showed  a Mallory-Weiss tear, nonbleeding gastric ulcer and stigmata of bleeding. Hgb stable at 14   Continue ongoing therapies and stroke work-up.  Patient counseled again to quit smoking cigarettes and marijuana.  Continue aspirin and Plavix for 3 weeks followed by Plavix alone for stroke prevention given history of recent GI bleed and aggressive risk factor modification.  Will likely need transfer to inpatient rehab Long discussion with patient and medical team and answered questions.  Discussed with Dr. Verlon Au.  Stroke team will sign off.  Kindly call for questions.  Greater than 50% time during this 35-minute visit with him in counseling and coordination of care about his lacunar strokes and discussion about stroke prevention and treatment and answering questions.  Antony Contras, MD Medical Director [facility] Pager: [phone] 12/30/2021 2:40 PM   To contact Stroke Continuity  provider, please refer to [email or web address] After hours, contact General Neurology

## 2021-12-30 NOTE — PMR Pre-admission (Signed)
PMR Admission Coordinator Pre-Admission Assessment   Patient: Chad Santos is an 61 y.o., male MRN: [id number] DOB: 11/21/1960 Height: 6' 1" (185.4 cm) Weight: 75.4 kg   Insurance Information HMO:       PPO:      PCP:      IPA:      80/20:      OTHER:  PRIMARY: Medicaid [place]      Policy#: [id number]      Subscriber: patient CM Name:       Phone#:      Fax#:  Pre-Cert#:       Employer:  Benefits:  Phone #:      Name:  Eff. Date: verified 12/30/21     Deduct: $0      Out of Pocket Max: $0      Life Max: n/a CIR: 100%      SNF:  Outpatient:      Co-Pay:  Home Health:       Co-Pay:  DME:      Co-Pay:  Providers:  SECONDARY:       Policy#:      Phone#:    Financial Counselor:       Phone#:    The "Data Collection Information Summary" for patients in Inpatient Rehabilitation Facilities with attached "Privacy Act Statement-Health Care Records" was provided and verbally reviewed with: N/A    Emergency Contact Information Contact Information       Name Relation Home Work Mobile    [name],[name] Sister     [phone]    [name],[name] Sister     [phone]           Current Medical History  Patient Admitting Diagnosis: CVA History of Present Illness: Patient is a 61 year old male admitted from home after fall. Patient found by family to have slurred speech and left sided weakness.  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 2D echo pending, recommend permissive hypertension.  PMH includes GI bleed, HTN, tobacco abuse.  Therapies recommending intensive short term inpatient rehab for PT, OT and speech therapy.      Complete NIHSS TOTAL: 5   Patient's medical record from [facility] has been reviewed by the rehabilitation admission coordinator and physician.   Past Medical History      Past Medical History:  Diagnosis Date   Hypertension        Has the  patient had major surgery during 100 days prior to admission? No   Family History   family history is not on file.   Current Medications   Current Facility-Administered Medications:     stroke: early stages of recovery book, , Does not apply, Once, Kakrakandy, Arshad N, MD   acetaminophen (TYLENOL) tablet 650 mg, 650 mg, Oral, Q4H PRN, 650 mg at 12/30/21 0635 **OR** acetaminophen (TYLENOL) 160 MG/5ML solution 650 mg, 650 mg, Per Tube, Q4H PRN **OR** acetaminophen (TYLENOL) suppository 650 mg, 650 mg, Rectal, Q4H PRN, Kakrakandy, Arshad N, MD   ALPRAZolam (XANAX) tablet 0.25 mg, 0.25 mg, Oral, BID PRN, Samtani, Jai-Gurmukh, MD, 0.25 mg at 12/30/21 1042   atorvastatin (LIPITOR) tablet 80 mg, 80 mg, Oral, QHS, Kakrakandy, Arshad N, MD, 80 mg at 12/29/21 2151   bacitracin ointment 1 application., 1 application , Topical, BID, Kakrakandy, Arshad N, MD, 1 application  at 12/30/21 1008   clopidogrel (PLAVIX) tablet 75 mg, 75 mg, Oral, Daily, Shafer,   Devon, NP, 75 mg at 12/30/21 1007   enoxaparin (LOVENOX) injection 40 mg, 40 mg, Subcutaneous, Q24H, Kakrakandy, Arshad N, MD, 40 mg at 12/29/21 2151   hydrALAZINE (APRESOLINE) injection 5 mg, 5 mg, Intravenous, Q4H PRN, Kakrakandy, Arshad N, MD, 5 mg at 12/29/21 1425   nicotine (NICODERM CQ - dosed in mg/24 hours) patch 21 mg, 21 mg, Transdermal, Daily, Shafer, Devon, NP, 21 mg at 12/30/21 1007   pantoprazole (PROTONIX) injection 40 mg, 40 mg, Intravenous, Q24H, Kakrakandy, Arshad N, MD, 40 mg at 12/29/21 2151   Patients Current Diet:  Diet Order                  DIET DYS 2 Room service appropriate? Yes; Fluid consistency: Thin  Diet effective now                         Precautions / Restrictions Precautions Precautions: Fall Restrictions Weight Bearing Restrictions: No    Has the patient had 2 or more falls or a fall with injury in the past year? No   Prior Activity Level Household: Patient was household ambulator with SPC prior to  admission   Prior Functional Level Self Care: Did the patient need help bathing, dressing, using the toilet or eating? Independent   Indoor Mobility: Did the patient need assistance with walking from room to room (with or without device)? Independent   Stairs: Did the patient need assistance with internal or external stairs (with or without device)? Independent   Functional Cognition: Did the patient need help planning regular tasks such as shopping or remembering to take medications? Independent   Patient Information Are you of Hispanic, Latino/a,or Spanish origin?: A. No, not of Hispanic, Latino/a, or Spanish origin What is your race?: B. Black or African American Do you need or want an interpreter to communicate with a doctor or health care staff?: 0. No   Patient's Response To:  Health Literacy and Transportation Is the patient able to respond to health literacy and transportation needs?: Yes Health Literacy - How often do you need to have someone help you when you read instructions, pamphlets, or other written material from your doctor or pharmacy?: Sometimes In the past 12 months, has lack of transportation kept you from medical appointments or from getting medications?: No In the past 12 months, has lack of transportation kept you from meetings, work, or from getting things needed for daily living?: No   Home Assistive Devices / Equipment Home Equipment: Cane - single point   Prior Device Use: Indicate devices/aids used by the patient prior to current illness, exacerbation or injury? None of the above   Current Functional Level Cognition   Arousal/Alertness: Awake/alert Overall Cognitive Status: Impaired/Different from baseline Current Attention Level: Sustained Orientation Level: Oriented X4 Following Commands: Follows one step commands consistently, Follows one step commands with increased time Safety/Judgement: Decreased awareness of safety, Decreased awareness of  deficits General Comments: Pt with decreased awareness to safety and deficits, requiring increased time Attention: Focused, Sustained, Selective Focused Attention: Appears intact Sustained Attention: Appears intact Selective Attention: Impaired Selective Attention Impairment: Verbal basic, Functional basic Memory: Impaired Memory Impairment: Storage deficit, Retrieval deficit Awareness: Impaired Awareness Impairment: Emergent impairment, Anticipatory impairment Problem Solving: Impaired Problem Solving Impairment: Functional basic Executive Function: Reasoning, Self Monitoring, Self Correcting Reasoning: Impaired Reasoning Impairment: Functional basic Self Monitoring: Impaired Self Monitoring Impairment: Functional basic Self Correcting: Impaired Self Correcting Impairment: Functional basic Behaviors: Impulsive    Extremity   Assessment (includes Sensation/Coordination)   Upper Extremity Assessment: Defer to OT evaluation LUE Deficits / Details: decreased coordination and mild weakness LUE Sensation: decreased light touch LUE Coordination: decreased fine motor  Lower Extremity Assessment: LLE deficits/detail LLE Deficits / Details: Grossly 3/5 in hip flexors and 4/5 in knee extensors. Decreased coordination noted     ADLs   Overall ADL's : Needs assistance/impaired Eating/Feeding: Set up, Sitting Grooming: Set up, Sitting Upper Body Bathing: Min guard, Sitting Lower Body Bathing: Minimal assistance, Sitting/lateral leans, Sit to/from stand Upper Body Dressing : Set up, Sitting Lower Body Dressing: Minimal assistance, Sitting/lateral leans, Sit to/from stand Toilet Transfer: Minimal assistance, +2 for safety/equipment, Ambulation Toileting- Clothing Manipulation and Hygiene: Minimal assistance, Sitting/lateral lean, Sit to/from stand Functional mobility during ADLs: Minimal assistance, +2 for safety/equipment General ADL Comments: Pt needing increased assist due to balance and  mild decrease in LUE coordinatino     Mobility   Overal bed mobility: Modified Independent     Transfers   Overall transfer level: Needs assistance Equipment used: 2 person hand held assist Transfers: Sit to/from Stand Sit to Stand: Min assist, +2 safety/equipment General transfer comment: Min A +2 to power up and steady. Reliant on BUE for balance     Ambulation / Gait / Stairs / Wheelchair Mobility   Ambulation/Gait Ambulation/Gait assistance: Min assist, Mod assist Gait Distance (Feet): 60 Feet Assistive device: 2 person hand held assist Gait Pattern/deviations: Step-through pattern, Decreased stride length, Narrow base of support General Gait Details: Decreased coordination noted and unsteady. LOB X2 during mobility requiring up to mod A for steadying. Gait velocity: Decreased     Posture / Balance Balance Overall balance assessment: Needs assistance Sitting-balance support: No upper extremity supported, Feet supported Sitting balance-Leahy Scale: Good Standing balance support: Bilateral upper extremity supported, During functional activity Standing balance-Leahy Scale: Poor Standing balance comment: Reliant on UE and external support     Special needs/care consideration N/a    Previous Home Environment (from acute therapy documentation) Living Arrangements: Alone  Lives With: Other (Comment) (sister) Available Help at Discharge: Family, Available PRN/intermittently (Pt reports intermittent) Type of Home: House Home Layout: One level Home Access: Stairs to enter Entrance Stairs-Rails: Right Entrance Stairs-Number of Steps: 4 Bathroom Shower/Tub: Tub/shower unit Bathroom Toilet: Standard Additional Comments: Pt's sister reports another sister will be moving into pt's home soon   Discharge Living Setting Plans for Discharge Living Setting: Patient's home, Lives with (comment) (sister) Type of Home at Discharge: House Discharge Home Layout: One level Discharge Home  Access: Stairs to enter Entrance Stairs-Rails: Right Entrance Stairs-Number of Steps: 4 Discharge Bathroom Shower/Tub: Tub/shower unit Discharge Bathroom Toilet: Standard Does the patient have any problems obtaining your medications?: No   Social/Family/Support Systems Anticipated Caregiver: Sister, [name], Sister [name] Anticipated Caregiver's Contact Information: [phone], [name] Ability/Limitations of Caregiver: no Caregiver Availability: 24/7 Discharge Plan Discussed with Primary Caregiver: Yes Is Caregiver In Agreement with Plan?: Yes Does Caregiver/Family have Issues with Lodging/Transportation while Pt is in Rehab?: No   Goals Patient/Family Goal for Rehab: For patient to return to supervision level, or mod I level with mobility Expected length of stay: 7-10 days Pt/Family Agrees to Admission and willing to participate: Yes Program Orientation Provided & Reviewed with Pt/Caregiver Including Roles  & Responsibilities: Yes  Barriers to Discharge: Insurance for SNF coverage   Decrease burden of Care through IP rehab admission: n/a   Possible need for SNF placement upon discharge: Not anticpated   Patient Condition: I have reviewed medical   records from [facility], spoken with  TOC team , and patient and family member. I met with patient at the bedside for inpatient rehabilitation assessment.  Patient will benefit from ongoing PT, OT, and SLP, can actively participate in 3 hours of therapy a day 5 days of the week, and can make measurable gains during the admission.  Patient will also benefit from the coordinated team approach during an Inpatient Acute Rehabilitation admission.  The patient will receive intensive therapy as well as Rehabilitation physician, nursing, social worker, and care management interventions.  Due to safety, disease management, medication administration, and patient education the patient requires 24 hour a day rehabilitation nursing.  The patient is  currently min A/modA with 2 people with mobility and basic ADLs.  Discharge setting and therapy post discharge at home with home health is anticipated.  Patient has agreed to participate in the Acute Inpatient Rehabilitation Program and will admit 01/01/22.   Preadmission Screen Completed By:  Conetta,Kristyn, MPT 12/30/2021 3:32 PM ______________________________________________________________________   Discussed status with Dr. Veralyn Lopp on 12/30/21 at 1530 and received approval for admission today.   Admission Coordinator:  Conetta,Kristyn, PT, time 3:32 PM/Date 12/30/21.  Updated by Dr. Kanita Delage today.     Assessment/Plan: Diagnosis: left PLIC infarct Does the need for close, 24 hr/day Medical supervision in concert with the patient's rehab needs make it unreasonable for this patient to be served in a less intensive setting? Yes Co-Morbidities requiring supervision/potential complications: esophagitis, hypertension, lumbar radiculopathy Due to bladder management, bowel management, safety, skin/wound care, disease management, medication administration, pain management, and patient education, does the patient require 24 hr/day rehab nursing? Yes Does the patient require coordinated care of a physician, rehab nurse, PT, OT, and SLP to address physical and functional deficits in the context of the above medical diagnosis(es)? Yes Addressing deficits in the following areas: balance, endurance, locomotion, strength, transferring, bowel/bladder control, bathing, dressing, feeding, grooming, toileting, cognition, speech, swallowing, and psychosocial support Can the patient actively participate in an intensive therapy program of at least 3 hrs of therapy 5 days a week? Yes The potential for patient to make measurable gains while on inpatient rehab is excellent Anticipated functional outcomes upon discharge from inpatient rehab: modified independent and supervision PT, modified independent and supervision OT,  modified independent and supervision SLP Estimated rehab length of stay to reach the above functional goals is: 7-10 days Anticipated discharge destination: Home 10. Overall Rehab/Functional Prognosis: excellent     MD Signature: Hasina Kreager T. Genia Perin, MD, FAAPMR  Physical Medicine & Rehabilitation Medical Director Rehabilitation Services 01/01/2022  

## 2021-12-30 NOTE — H&P (Signed)
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  He initially called his family who noticed that his speech was slurred and EMS was called.  He fell sustaining a laceration to his left forearm but denied head trauma.  He was outside the window for tenecteplase and imaging revealed no LVO suspected.  Plavix was initiated on 6/8 and home aspirin held due to the patient's history of recent upper GI bleed.  MRI of the brain showed small right internal capsule lacunar infarct.  He had noted improvement in his dysarthria.  2D echo revealed an ejection fraction of approximately 45 to 50% with mild global hypokinesis.  Left upper extremity and left lower extremity strength approximately 4/5.  The patient is tolerating dysphagia 2 with thin liquids.  Recommend Plavix and no aspirin due to history of PUD.  H&H stable.The patient requires inpatient physical medicine and rehabilitation evaluations and treatment secondary to dysfunction due to acute lacunar infarct.  Review of Systems  Constitutional:  Negative for chills and fever.  HENT:  Negative for congestion, hearing loss and sore throat.   Eyes:  Negative for blurred vision and double vision.  Respiratory:  Positive for cough. Negative for hemoptysis and sputum production.   Cardiovascular:  Negative for chest pain and palpitations.  Gastrointestinal:  Negative for abdominal pain, constipation, nausea and vomiting.       Last BM today  Genitourinary:  Negative for dysuria, hematuria and urgency.   Musculoskeletal:  Negative for back pain and neck pain.  Neurological:  Negative for dizziness and headaches.   Past Medical History:  Diagnosis Date   Hypertension    Past Surgical History:  Procedure Laterality Date   BIOPSY  08/23/2021   Procedure: BIOPSY;  Surgeon: [name], MD;  Location: [facility] ENDOSCOPY;  Service: Gastroenterology;;   COLONOSCOPY WITH PROPOFOL N/A 08/23/2021   Procedure: COLONOSCOPY WITH PROPOFOL;  Surgeon: [name], MD;  Location: [place];  Service: Gastroenterology;  Laterality: N/A;   ESOPHAGOGASTRODUODENOSCOPY (EGD) WITH PROPOFOL N/A 08/23/2021   Procedure: ESOPHAGOGASTRODUODENOSCOPY (EGD) WITH PROPOFOL;  Surgeon: [name], MD;  Location: [place];  Service: Gastroenterology;  Laterality: N/A;   HAND SURGERY Right    POLYPECTOMY  08/23/2021   Procedure: POLYPECTOMY;  Surgeon: [name], MD;  Location: [facility] ENDOSCOPY;  Service: Gastroenterology;;   Family History  Problem Relation Age of Onset   Colon cancer Neg Hx    Esophageal cancer Neg Hx    Rectal cancer Neg Hx    Stomach cancer Neg Hx    Social History:  reports that he has been smoking cigarettes. He has a 37.00 pack-year smoking history. He has never used smokeless tobacco. He reports current alcohol use. He reports that he does not use drugs. Allergies: No Known Allergies Medications Prior to Admission  Medication Sig Dispense Refill   hydrOXYzine (ATARAX) 25 MG tablet Take 1 tablet (25 mg total) by mouth at bedtime as needed (insomnia). 10 tablet 0   pantoprazole (PROTONIX) 40 MG tablet Take 1 tablet (40 mg total) by mouth 2 (two) times daily.  60 tablet 3      Home: Home Living Family/patient expects to be discharged to:: Private residence Living Arrangements: Alone Available Help at Discharge: Family, Available PRN/intermittently (Pt reports intermittent) Type of Home: House Home Access: Stairs to enter [employer] of Steps: 4 Entrance Stairs-Rails:  Right Home Layout: One level Bathroom Shower/Tub: Chiropodist: Standard Home Equipment: Cane - single point Additional Comments: Pt's sister reports another sister will be moving into pt's home soon  Lives With: Other (Comment) (sister)   Functional History: Prior Function Prior Level of Function : Independent/Modified Independent Mobility Comments: Uses cane for mobility ADLs Comments: Reports independence  Functional Status:  Mobility: Bed Mobility Overal bed mobility: Modified Independent Transfers Overall transfer level: Needs assistance Equipment used: 2 person hand held assist Transfers: Sit to/from Stand Sit to Stand: Min assist, +2 safety/equipment General transfer comment: Min A +2 to power up and steady. Reliant on BUE for balance Ambulation/Gait Ambulation/Gait assistance: Min assist, Mod assist Gait Distance (Feet): 60 Feet Assistive device: 2 person hand held assist Gait Pattern/deviations: Step-through pattern, Decreased stride length, Narrow base of support General Gait Details: Decreased coordination noted and unsteady. LOB X2 during mobility requiring up to mod A for steadying. Gait velocity: Decreased    ADL: ADL Overall ADL's : Needs assistance/impaired Eating/Feeding: Set up, Sitting Grooming: Set up, Sitting Upper Body Bathing: Min guard, Sitting Lower Body Bathing: Minimal assistance, Sitting/lateral leans, Sit to/from stand Upper Body Dressing : Set up, Sitting Lower Body Dressing: Minimal assistance, Sitting/lateral leans, Sit to/from stand Toilet Transfer: Minimal assistance, +2 for safety/equipment, Ambulation Toileting- Clothing Manipulation and Hygiene: Minimal assistance, Sitting/lateral lean, Sit to/from stand Functional mobility during ADLs: Minimal assistance, +2 for safety/equipment General ADL Comments: Pt needing increased assist due to balance and mild decrease in LUE coordinatino  Cognition: Cognition Overall  Cognitive Status: Impaired/Different from baseline Arousal/Alertness: Awake/alert Orientation Level: Oriented X4 Attention: Focused, Sustained, Selective Focused Attention: Appears intact Sustained Attention: Appears intact Selective Attention: Impaired Selective Attention Impairment: Verbal basic, Functional basic Memory: Impaired Memory Impairment: Storage deficit, Retrieval deficit Awareness: Impaired Awareness Impairment: Emergent impairment, Anticipatory impairment Problem Solving: Impaired Problem Solving Impairment: Functional basic Executive Function: Reasoning, Self Monitoring, Self Correcting Reasoning: Impaired Reasoning Impairment: Functional basic Self Monitoring: Impaired Self Monitoring Impairment: Functional basic Self Correcting: Impaired Self Correcting Impairment: Functional basic Behaviors: Impulsive Cognition Arousal/Alertness: Awake/alert Behavior During Therapy: WFL for tasks assessed/performed Overall Cognitive Status: Impaired/Different from baseline Area of Impairment: Attention, Following commands, Safety/judgement, Awareness, Problem solving Current Attention Level: Sustained Following Commands: Follows one step commands consistently, Follows one step commands with increased time Safety/Judgement: Decreased awareness of safety, Decreased awareness of deficits Awareness: Emergent Problem Solving: Slow processing, Requires verbal cues General Comments: Pt with decreased awareness to safety and deficits, requiring increased time  Physical Exam: Blood pressure (!) 154/105, pulse 75, temperature 99.1 F (37.3 C), temperature source Oral, resp. rate 17, height '6\' 1"'$  (1.854 m), weight 75.4 kg, SpO2 99 %. Physical Exam Constitutional:      General: He is not in acute distress.    Appearance: Normal appearance. He is not ill-appearing.  HENT:     Head: Normocephalic and atraumatic.     Right Ear: External ear normal.     Left Ear: External ear normal.      Nose: Nose normal.  Eyes:     Pupils: Pupils are equal, round, and reactive to light.  Cardiovascular:     Rate and Rhythm: Normal rate and regular rhythm.  Pulmonary:     Effort: Pulmonary effort is normal.     Breath sounds: Normal breath sounds.  Abdominal:     General: Bowel sounds are normal.     Palpations: Abdomen is soft.     Tenderness: There is no abdominal tenderness.  Musculoskeletal:        General: No swelling.     Comments: Bilateral hallux valgus  Skin:    General: Skin is warm and dry.  Neurological:     Mental Status: He is alert and oriented to person, place, and time.     Comments: Oriented. Fair insight and awareness. Able to provide some biographical information. Severe dysarthria. Left VII and X11 deficits, did not seem to be able to visually track to left or much past midline for that matter. Motor 5/5 RUE and RLE and 4+/5 LUE and LLE. No focal sensory findings.   Psychiatric:     Comments: Flat but overall cooperative and relatively pleasant     Results for orders placed or performed during the hospital encounter of 12/28/21 (from the past 48 hour(s))  Ethanol     Status: None   Collection Time: 12/28/21  6:34 PM  Result Value Ref Range   Alcohol, Ethyl (B) <10 <10 mg/dL    Comment: (NOTE) Lowest detectable limit for serum alcohol is 10 mg/dL.  For medical purposes only. Performed at [facility], [place] [address], [place], [place] [address]   Protime-INR     Status: None   Collection Time: 12/28/21  6:34 PM  Result Value Ref Range   Prothrombin Time 12.9 11.4 - 15.2 seconds   INR 1.0 0.8 - 1.2    Comment: (NOTE) INR goal varies based on device and disease states. Performed at [facility], [place] [address], [place], [place] [address]   APTT     Status: None   Collection Time: 12/28/21  6:34 PM  Result Value Ref Range   aPTT 35 24 - 36 seconds    Comment: Performed at [facility] [address] [place], [place] [address]   CBC     Status: None   Collection Time: 12/28/21  6:34 PM  Result Value Ref Range   WBC 8.1 4.0 - 10.5 K/uL   RBC 4.62 4.22 - 5.81 MIL/uL   Hemoglobin 14.0 13.0 - 17.0 g/dL   HCT 42.1 39.0 - 52.0 %   MCV 91.1 80.0 - 100.0 fL   MCH 30.3 26.0 - 34.0 pg   MCHC 33.3 30.0 - 36.0 g/dL   RDW 13.7 11.5 - 15.5 %   Platelets 236 150 - 400 K/uL   nRBC 0.0 0.0 - 0.2 %    Comment: Performed at [facility], [place] [address], [place], [place] [address]  Differential     Status: None   Collection Time: 12/28/21  6:34 PM  Result Value Ref Range   Neutrophils Relative % 64 %   Neutro Abs 5.2 1.7 - 7.7 K/uL   Lymphocytes Relative 27 %   Lymphs Abs 2.2 0.7 - 4.0 K/uL   Monocytes Relative 7 %   Monocytes Absolute 0.6 0.1 - 1.0 K/uL   Eosinophils Relative 1 %   Eosinophils Absolute 0.1 0.0 - 0.5 K/uL   Basophils Relative 1 %   Basophils Absolute 0.0 0.0 - 0.1 K/uL   Immature Granulocytes 0 %   Abs Immature Granulocytes 0.02 0.00 - 0.07 K/uL    Comment: Performed at [facility]  [facility], [place] [address] [place], [place] [address]  Comprehensive metabolic panel     Status: Abnormal   Collection Time: 12/28/21  6:34 PM  Result Value Ref Range   Sodium 139 135 - 145 mmol/L   Potassium 4.2 3.5 - 5.1 mmol/L   Chloride 103 98 - 111 mmol/L   CO2 26 22 - 32 mmol/L   Glucose, Bld 88 70 - 99 mg/dL    Comment: Glucose reference range applies only to samples taken after fasting for at least 8 hours.   BUN 16 8 - 23 mg/dL   Creatinine, Ser 1.35 (H) 0.61 - 1.24 mg/dL   Calcium 9.5 8.9 - 10.3 mg/dL   Total Protein 6.8 6.5 - 8.1 g/dL   Albumin 3.6 3.5 - 5.0 g/dL   AST 18 15 - 41 U/L   ALT 9 0 - 44 U/L   Alkaline Phosphatase 94 38 - 126 U/L   Total Bilirubin 0.5 0.3 - 1.2 mg/dL   GFR, Estimated 59 (L) >60 mL/min    Comment: (NOTE) Calculated using the CKD-EPI Creatinine Equation (2021)    Anion gap 10 5 - 15    Comment: Performed at [facility] [address] [place], [place] [address]   Hemoglobin A1c     Status: Abnormal   Collection Time: 12/28/21  6:44 PM  Result Value Ref Range   Hgb A1c MFr Bld 6.1 (H) 4.8 - 5.6 %    Comment: (NOTE) Pre diabetes:          5.7%-6.4%  Diabetes:              >6.4%  Glycemic control for   <7.0% adults with diabetes    Mean Plasma Glucose 128.37 mg/dL    Comment: Performed at [facility] [address], [place], [place] [address]  Lipid panel     Status: Abnormal   Collection Time: 12/28/21  6:44 PM  Result Value Ref Range   Cholesterol 189 0 - 200 mg/dL   Triglycerides 86 <150 mg/dL   HDL 56 >40 mg/dL   Total CHOL/HDL Ratio 3.4 RATIO   VLDL 17 0 - 40 mg/dL   LDL Cholesterol 116 (H) 0 - 99 mg/dL    Comment:        Total Cholesterol/HDL:CHD Risk Coronary Heart Disease Risk Table                     Men   Women  1/2 Average Risk   3.4   3.3  Average Risk       5.0   4.4  2 X Average Risk   9.6   7.1  3 X Average Risk  23.4   11.0        Use the calculated Patient Ratio above and the CHD Risk Table to determine the patient's CHD Risk.        ATP III CLASSIFICATION (LDL):  <100     mg/dL   Optimal  100-129  mg/dL   Near or Above                    Optimal  130-159  mg/dL   Borderline  160-189  mg/dL   High  >190     mg/dL   Very High Performed at [facility] [address], [place],  [address]   I-stat chem 8, ED     Status: Abnormal   Collection  Time: 12/28/21  6:46 PM  Result Value Ref Range   Sodium 139 135 - 145 mmol/L   Potassium 4.1 3.5 - 5.1 mmol/L   Chloride 101 98 - 111 mmol/L   BUN 19 8 - 23 mg/dL   Creatinine, Ser 1.30 (H) 0.61 - 1.24 mg/dL   Glucose, Bld 86 70 - 99 mg/dL    Comment: Glucose reference range applies only to samples taken after fasting for at least 8 hours.   Calcium, Ion 1.07 (L) 1.15 - 1.40 mmol/L   TCO2 27 22 - 32 mmol/L   Hemoglobin 14.6 13.0 - 17.0 g/dL   HCT 43.0 39.0 - 52.0 %  Urine rapid drug screen (hosp performed)     Status: Abnormal   Collection  Time: 12/28/21  8:37 PM  Result Value Ref Range   Opiates NONE DETECTED NONE DETECTED   Cocaine NONE DETECTED NONE DETECTED   Benzodiazepines NONE DETECTED NONE DETECTED   Amphetamines NONE DETECTED NONE DETECTED   Tetrahydrocannabinol POSITIVE (A) NONE DETECTED   Barbiturates NONE DETECTED NONE DETECTED    Comment: (NOTE) DRUG SCREEN FOR MEDICAL PURPOSES ONLY.  IF CONFIRMATION IS NEEDED FOR ANY PURPOSE, NOTIFY LAB WITHIN 5 DAYS.  LOWEST DETECTABLE LIMITS FOR URINE DRUG SCREEN Drug Class                     Cutoff (ng/mL) Amphetamine and metabolites    1000 Barbiturate and metabolites    200 Benzodiazepine                 952 Tricyclics and metabolites     300 Opiates and metabolites        300 Cocaine and metabolites        300 THC                            50 Performed at [facility], [place] [address] [place], [place] [address]   Urinalysis, Routine w reflex microscopic     Status: Abnormal   Collection Time: 12/28/21  8:37 PM  Result Value Ref Range   Color, Urine YELLOW YELLOW   APPearance CLEAR CLEAR   Specific Gravity, Urine 1.028 1.005 - 1.030   pH 7.0 5.0 - 8.0   Glucose, UA NEGATIVE NEGATIVE mg/dL   Hgb urine dipstick NEGATIVE NEGATIVE   Bilirubin Urine NEGATIVE NEGATIVE   Ketones, ur 5 (A) NEGATIVE mg/dL   Protein, ur NEGATIVE NEGATIVE mg/dL   Nitrite NEGATIVE NEGATIVE   Leukocytes,Ua NEGATIVE NEGATIVE    Comment: Performed at [facility] [address], [place], [place] [address]  Resp Panel by RT-PCR (Flu A&B, Covid) Anterior Nasal Swab     Status: None   Collection Time: 12/28/21  9:33 PM   Specimen: Anterior Nasal Swab  Result Value Ref Range   SARS Coronavirus 2 by RT PCR NEGATIVE NEGATIVE    Comment: (NOTE) SARS-CoV-2 target nucleic acids are NOT DETECTED.  The SARS-CoV-2 RNA is generally detectable in upper respiratory specimens during the acute phase of infection. The lowest concentration of SARS-CoV-2 viral copies this assay can  detect is 138 copies/mL. A negative result does not preclude SARS-Cov-2 infection and should not be used as the sole basis for treatment or other patient management decisions. A negative result may occur with  improper specimen collection/handling, submission of specimen other than nasopharyngeal swab, presence of viral mutation(s) within the areas targeted by this assay,  and inadequate number of viral copies(<138 copies/mL). A negative result must be combined with clinical observations, patient history, and epidemiological information. The expected result is Negative.  Fact Sheet for Patients:  EntrepreneurPulse.com.au  Fact Sheet for Healthcare Providers:  IncredibleEmployment.be  This test is no t yet approved or cleared by the [place] FDA and  has been authorized for detection and/or diagnosis of SARS-CoV-2 by FDA under an Emergency Use Authorization (EUA). This EUA will remain  in effect (meaning this test can be used) for the duration of the COVID-19 declaration under Section 564(b)(1) of the Act, 21 U.S.C.section 360bbb-3(b)(1), unless the authorization is terminated  or revoked sooner.       Influenza A by PCR NEGATIVE NEGATIVE   Influenza B by PCR NEGATIVE NEGATIVE    Comment: (NOTE) The Xpert Xpress SARS-CoV-2/FLU/RSV plus assay is intended as an aid in the diagnosis of influenza from Nasopharyngeal swab specimens and should not be used as a sole basis for treatment. Nasal washings and aspirates are unacceptable for Xpert Xpress SARS-CoV-2/FLU/RSV testing.  Fact Sheet for Patients: EntrepreneurPulse.com.au  Fact Sheet for Healthcare Providers: IncredibleEmployment.be  This test is not yet approved or cleared by the [place] FDA and has been authorized for detection and/or diagnosis of SARS-CoV-2 by FDA under an Emergency Use Authorization (EUA). This EUA will remain in effect  (meaning this test can be used) for the duration of the COVID-19 declaration under Section 564(b)(1) of the Act, 21 U.S.C. section 360bbb-3(b)(1), unless the authorization is terminated or revoked.  Performed at [facility], [place] [address] [place], [place] [address]   Basic metabolic panel     Status: Abnormal   Collection Time: 12/30/21  2:57 AM  Result Value Ref Range   Sodium 137 135 - 145 mmol/L   Potassium 3.6 3.5 - 5.1 mmol/L   Chloride 101 98 - 111 mmol/L   CO2 26 22 - 32 mmol/L   Glucose, Bld 103 (H) 70 - 99 mg/dL    Comment: Glucose reference range applies only to samples taken after fasting for at least 8 hours.   BUN 10 8 - 23 mg/dL   Creatinine, Ser 1.27 (H) 0.61 - 1.24 mg/dL   Calcium 9.1 8.9 - 10.3 mg/dL   GFR, Estimated >60 >60 mL/min    Comment: (NOTE) Calculated using the CKD-EPI Creatinine Equation (2021)    Anion gap 10 5 - 15    Comment: Performed at [facility] [address] [place], [place] [address]   ECHOCARDIOGRAM COMPLETE  Result Date: 12/29/2021    ECHOCARDIOGRAM REPORT   Patient Name:   CARLTON BUSKEY Date of Exam: 12/29/2021 Medical Rec #:  [id number]        Height:       73.0 in Accession #:    [id number]       Weight:       174.0 lb Date of Birth:  January 13, 1961         BSA:          2.028 m Patient Age:    68 years         BP:           166/92 mmHg Patient Gender: M                HR:           66 bpm. Exam Location:  Inpatient Procedure: 2D Echo, Cardiac Doppler and Color Doppler Indications:    Stroke  History:        Patient has no prior history of Echocardiogram examinations.                 Risk Factors:Hypertension.  Sonographer:    Jyl Heinz Referring Phys: [place]  1. Left ventricular ejection fraction, by estimation, is 45 to 50%. The left ventricle has mildly decreased function. The left ventricle demonstrates global hypokinesis. There is mild concentric left ventricular hypertrophy. Left ventricular  diastolic parameters are consistent with Grade I diastolic dysfunction (impaired relaxation).  2. Right ventricular systolic function is normal. The right ventricular size is normal. There is normal pulmonary artery systolic pressure.  3. The mitral valve is normal in structure. Trivial mitral valve regurgitation. No evidence of mitral stenosis.  4. The aortic valve is tricuspid. Aortic valve regurgitation is not visualized. No aortic stenosis is present.  5. Aortic dilatation noted. There is mild dilatation of the aortic root, measuring 39 mm.  6. The inferior vena cava is normal in size with greater than 50% respiratory variability, suggesting right atrial pressure of 3 mmHg. FINDINGS  Left Ventricle: Left ventricular ejection fraction, by estimation, is 45 to 50%. The left ventricle has mildly decreased function. The left ventricle demonstrates global hypokinesis. The left ventricular internal cavity size was normal in size. There is  mild concentric left ventricular hypertrophy. Left ventricular diastolic parameters are consistent with Grade I diastolic dysfunction (impaired relaxation). Indeterminate filling pressures. Right Ventricle: The right ventricular size is normal. No increase in right ventricular wall thickness. Right ventricular systolic function is normal. There is normal pulmonary artery systolic pressure. The tricuspid regurgitant velocity is 1.57 m/s, and  with an assumed right atrial pressure of 3 mmHg, the estimated right ventricular systolic pressure is 29.5 mmHg. Left Atrium: Left atrial size was normal in size. Right Atrium: Right atrial size was normal in size. Pericardium: There is no evidence of pericardial effusion. Mitral Valve: The mitral valve is normal in structure. Trivial mitral valve regurgitation. No evidence of mitral valve stenosis. Tricuspid Valve: The tricuspid valve is normal in structure. Tricuspid valve regurgitation is trivial. No evidence of tricuspid stenosis. Aortic  Valve: The aortic valve is tricuspid. Aortic valve regurgitation is not visualized. No aortic stenosis is present. Aortic valve peak gradient measures 8.0 mmHg. Pulmonic Valve: The pulmonic valve was normal in structure. Pulmonic valve regurgitation is trivial. No evidence of pulmonic stenosis. Aorta: Aortic dilatation noted. There is mild dilatation of the aortic root, measuring 39 mm. Venous: The inferior vena cava is normal in size with greater than 50% respiratory variability, suggesting right atrial pressure of 3 mmHg. IAS/Shunts: No atrial level shunt detected by color flow Doppler.  LEFT VENTRICLE PLAX 2D LVIDd:         4.70 cm      Diastology LVIDs:         3.50 cm      LV e' medial:    4.35 cm/s LV PW:         1.10 cm      LV E/e' medial:  11.7 LV IVS:        1.20 cm      LV e' lateral:   5.77 cm/s LVOT diam:     2.00 cm      LV E/e' lateral: 8.9 LV SV:         64 LV SV Index:   31 LVOT Area:     3.14 cm  LV Volumes (MOD) LV  vol d, MOD A2C: 146.0 ml LV vol d, MOD A4C: 151.0 ml LV vol s, MOD A2C: 73.4 ml LV vol s, MOD A4C: 86.7 ml LV SV MOD A2C:     72.6 ml LV SV MOD A4C:     151.0 ml LV SV MOD BP:      68.9 ml RIGHT VENTRICLE             IVC RV Basal diam:  2.60 cm     IVC diam: 1.60 cm RV Mid diam:    2.20 cm RV S prime:     10.60 cm/s TAPSE (M-mode): 2.0 cm LEFT ATRIUM             Index        RIGHT ATRIUM           Index LA diam:        3.60 cm 1.78 cm/m   RA Area:     15.30 cm LA Vol (A2C):   41.6 ml 20.51 ml/m  RA Volume:   37.30 ml  18.39 ml/m LA Vol (A4C):   39.7 ml 19.58 ml/m LA Biplane Vol: 41.3 ml 20.37 ml/m  AORTIC VALVE AV Area (Vmax): 2.63 cm AV Vmax:        141.00 cm/s AV Peak Grad:   8.0 mmHg LVOT Vmax:      118.00 cm/s LVOT Vmean:     80.200 cm/s LVOT VTI:       0.203 m  AORTA Ao Root diam: 3.90 cm Ao Asc diam:  3.50 cm MITRAL VALVE               TRICUSPID VALVE MV Area (PHT): 3.60 cm    TR Peak grad:   9.9 mmHg MV Decel Time: 211 msec    TR Vmax:        157.00 cm/s MV E velocity:  51.10 cm/s MV A velocity: 72.00 cm/s  SHUNTS MV E/A ratio:  0.71        Systemic VTI:  0.20 m                            Systemic Diam: 2.00 cm Skeet Latch MD Electronically signed by Skeet Latch MD Signature Date/Time: 12/29/2021/5:53:40 PM    Final    MR BRAIN WO CONTRAST  Result Date: 12/28/2021 CLINICAL DATA:  Left-sided weakness EXAM: MRI HEAD WITHOUT CONTRAST MRA HEAD WITHOUT CONTRAST TECHNIQUE: Multiplanar, multi-echo pulse sequences of the brain and surrounding structures were acquired without intravenous contrast. Angiographic images of the Circle of Willis were acquired using MRA technique without intravenous contrast. COMPARISON:  None Available. FINDINGS: MRI HEAD FINDINGS Brain: Small acute infarct along the posterior limb of the right internal capsule. No acute or chronic hemorrhage. There is multifocal hyperintense T2-weighted signal within the white matter. Generalized cerebral volume loss. 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 The midline structures are normal. Vascular: Major flow voids are preserved. Skull and upper cervical spine: Normal calvarium and skull base. Visualized upper cervical spine and soft tissues are normal. Sinuses/Orbits:No paranasal sinus fluid levels or advanced mucosal thickening. No mastoid or middle ear effusion. Normal orbits. MRA HEAD FINDINGS POSTERIOR CIRCULATION: --Vertebral arteries: Normal --Inferior cerebellar arteries: Normal. --Basilar artery: Normal. --Superior cerebellar arteries: Normal. --Posterior cerebral arteries: Normal. ANTERIOR CIRCULATION: --Intracranial internal carotid arteries: Normal. --Anterior cerebral arteries (ACA): Normal. --Middle cerebral arteries (MCA): Normal. ANATOMIC VARIANTS: Both posterior communicating arteries are patent. IMPRESSION: 1.  Small acute infarct along the posterior limb of the right internal capsule. No hemorrhage or mass effect. 2. Normal intracranial MRA. 3. 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 Electronically Signed   By: Ulyses Jarred M.D.   On: 12/28/2021 23:44   MR ANGIO HEAD WO CONTRAST  Result Date: 12/28/2021 CLINICAL DATA:  Left-sided weakness EXAM: MRI HEAD WITHOUT CONTRAST MRA HEAD WITHOUT CONTRAST TECHNIQUE: Multiplanar, multi-echo pulse sequences of the brain and surrounding structures were acquired without intravenous contrast. Angiographic images of the Circle of Willis were acquired using MRA technique without intravenous contrast. COMPARISON:  None Available. FINDINGS: MRI HEAD FINDINGS Brain: Small acute infarct along the posterior limb of the right internal capsule. No acute or chronic hemorrhage. There is multifocal hyperintense T2-weighted signal within the white matter. Generalized cerebral volume loss. 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 The midline structures are normal. Vascular: Major flow voids are preserved. Skull and upper cervical spine: Normal calvarium and skull base. Visualized upper cervical spine and soft tissues are normal. Sinuses/Orbits:No paranasal sinus fluid levels or advanced mucosal thickening. No mastoid or middle ear effusion. Normal orbits. MRA HEAD FINDINGS POSTERIOR CIRCULATION: --Vertebral arteries: Normal --Inferior cerebellar arteries: Normal. --Basilar artery: Normal. --Superior cerebellar arteries: Normal. --Posterior cerebral arteries: Normal. ANTERIOR CIRCULATION: --Intracranial internal carotid arteries: Normal. --Anterior cerebral arteries (ACA): Normal. --Middle cerebral arteries (MCA): Normal. ANATOMIC VARIANTS: Both posterior communicating arteries are patent. IMPRESSION: 1. Small acute infarct along the posterior limb of the right internal capsule. No hemorrhage or mass effect. 2. Normal intracranial MRA. 3. 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 Electronically Signed   By: Ulyses Jarred M.D.   On: 12/28/2021 23:44   DG Forearm Left  Result Date:  12/28/2021 CLINICAL DATA:  Rash on arm. EXAM: LEFT FOREARM - 2 VIEW COMPARISON:  None Available. FINDINGS: There is focal soft tissue swelling over the posterior aspect of the proximal ulna. There is no radiopaque foreign body or soft tissue gas collection. There is no underlying cortical erosion or periosteal reaction. No acute fracture or dislocation. Antecubital IV is present. IMPRESSION: 1. Soft tissue swelling of the posterior forearm. 2. No acute bony abnormality. Electronically Signed   By: Ronney Asters M.D.   On: 12/28/2021 22:22   DG Pelvis 1-2 Views  Result Date: 12/28/2021 CLINICAL DATA:  Stroke-like symptoms.  Rash on the left arm. EXAM: PELVIS - 1-2 VIEW COMPARISON:  None Available. FINDINGS: There is no acute fracture or dislocation. Moderate right and severe left hip osteoarthritic changes. There is near complete loss of left hip joint space with bone-on-bone contact. Excreted contrast noted within the urinary bladder outlining a slightly trabeculated appearing bladder wall, possibly related to chronic bladder outlet obstruction. The soft tissues are unremarkable. IMPRESSION: 1. No acute fracture or dislocation. 2. Moderate right and severe left hip osteoarthritic changes. Electronically Signed   By: Anner Crete M.D.   On: 12/28/2021 22:20   CT ANGIO HEAD NECK W WO CM  Result Date: 12/28/2021 CLINICAL DATA:  Acute neurologic deficit EXAM: CT HEAD WITHOUT CONTRAST CT ANGIOGRAPHY OF THE HEAD AND NECK TECHNIQUE: Contiguous axial images were obtained from the base of the skull through the vertex without intravenous contrast. Multidetector CT imaging of the head and neck was performed using the standard protocol during bolus administration of intravenous contrast. Multiplanar CT image reconstructions and MIPs were obtained to evaluate the vascular anatomy. Carotid stenosis measurements (when applicable) are obtained utilizing NASCET  criteria, using the distal internal carotid diameter as the  denominator. RADIATION DOSE REDUCTION: This exam was performed according to the departmental dose-optimization program which includes automated exposure control, adjustment of the mA and/or kV according to patient size and/or use of iterative reconstruction technique. CONTRAST:  68m OMNIPAQUE IOHEXOL 350 MG/ML SOLN COMPARISON:  None Available. FINDINGS: CT HEAD FINDINGS Brain: There is no mass, hemorrhage or extra-axial collection. There is generalized atrophy without lobar predilection. There are old bilateral basal ganglia small vessel infarcts and left cerebellar infarct. There is hypoattenuation of the periventricular white matter, most commonly indicating chronic ischemic microangiopathy. Skull: The visualized skull base, calvarium and extracranial soft tissues are normal. Sinuses/Orbits: No fluid levels or advanced mucosal thickening of the visualized paranasal sinuses. No mastoid or middle ear effusion. The orbits are normal. CTA NECK FINDINGS SKELETON: There is no bony spinal canal stenosis. No lytic or blastic lesion. OTHER NECK: Normal pharynx, larynx and major salivary glands. No cervical lymphadenopathy. Unremarkable thyroid gland. UPPER CHEST: No pneumothorax or pleural effusion. No nodules or masses. AORTIC ARCH: There is calcific atherosclerosis of the aortic arch. There is no aneurysm, dissection or hemodynamically significant stenosis of the visualized portion of the aorta. Conventional 3 vessel aortic branching pattern. The visualized proximal subclavian arteries are widely patent. RIGHT CAROTID SYSTEM: Normal without aneurysm, dissection or stenosis. LEFT CAROTID SYSTEM: Normal without aneurysm, dissection or stenosis. VERTEBRAL ARTERIES: Codominant configuration. Both origins are clearly patent. There is no dissection, occlusion or flow-limiting stenosis to the skull base (V1-V3 segments). CTA HEAD FINDINGS POSTERIOR CIRCULATION: --Vertebral arteries: Normal V4 segments. --Inferior cerebellar  arteries: Normal. --Basilar artery: Normal. --Superior cerebellar arteries: Normal. --Posterior cerebral arteries (PCA): Normal. ANTERIOR CIRCULATION: --Intracranial internal carotid arteries: Normal. --Anterior cerebral arteries (ACA): Normal. Both A1 segments are present. Patent anterior communicating artery (a-comm). --Middle cerebral arteries (MCA): Normal. VENOUS SINUSES: As permitted by contrast timing, patent. ANATOMIC VARIANTS: Fetal origin of the right posterior cerebral artery. Review of the MIP images confirms the above findings. IMPRESSION: 1. No emergent large vessel occlusion or hemodynamically significant stenosis of the head or neck. 2. 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 3. Aortic Atherosclerosis (ICD10-I70.0). Electronically Signed   By: KUlyses JarredM.D.   On: 12/28/2021 20:17   CT HEAD WO CONTRAST (5MM)  Result Date: 12/28/2021 CLINICAL DATA:  Acute neurologic deficit EXAM: CT HEAD WITHOUT CONTRAST CT ANGIOGRAPHY OF THE HEAD AND NECK TECHNIQUE: Contiguous axial images were obtained from the base of the skull through the vertex without intravenous contrast. Multidetector CT imaging of the head and neck was performed using the standard protocol during bolus administration of intravenous contrast. Multiplanar CT image reconstructions and MIPs were obtained to evaluate the vascular anatomy. Carotid stenosis measurements (when applicable) are obtained utilizing NASCET criteria, using the distal internal carotid diameter as the denominator. RADIATION DOSE REDUCTION: This exam was performed according to the departmental dose-optimization program which includes automated exposure control, adjustment of the mA and/or kV according to patient size and/or use of iterative reconstruction technique. CONTRAST:  768mOMNIPAQUE IOHEXOL 350 MG/ML SOLN COMPARISON:  None Available. FINDINGS: CT HEAD FINDINGS Brain: There is no mass, hemorrhage or extra-axial collection. There is generalized  atrophy without lobar predilection. There are old bilateral basal ganglia small vessel infarcts and left cerebellar infarct. There is hypoattenuation of the periventricular white matter, most commonly indicating chronic ischemic microangiopathy. Skull: The visualized skull base, calvarium and extracranial soft tissues are normal. Sinuses/Orbits: No fluid levels or advanced mucosal thickening of  the visualized paranasal sinuses. No mastoid or middle ear effusion. The orbits are normal. CTA NECK FINDINGS SKELETON: There is no bony spinal canal stenosis. No lytic or blastic lesion. OTHER NECK: Normal pharynx, larynx and major salivary glands. No cervical lymphadenopathy. Unremarkable thyroid gland. UPPER CHEST: No pneumothorax or pleural effusion. No nodules or masses. AORTIC ARCH: There is calcific atherosclerosis of the aortic arch. There is no aneurysm, dissection or hemodynamically significant stenosis of the visualized portion of the aorta. Conventional 3 vessel aortic branching pattern. The visualized proximal subclavian arteries are widely patent. RIGHT CAROTID SYSTEM: Normal without aneurysm, dissection or stenosis. LEFT CAROTID SYSTEM: Normal without aneurysm, dissection or stenosis. VERTEBRAL ARTERIES: Codominant configuration. Both origins are clearly patent. There is no dissection, occlusion or flow-limiting stenosis to the skull base (V1-V3 segments). CTA HEAD FINDINGS POSTERIOR CIRCULATION: --Vertebral arteries: Normal V4 segments. --Inferior cerebellar arteries: Normal. --Basilar artery: Normal. --Superior cerebellar arteries: Normal. --Posterior cerebral arteries (PCA): Normal. ANTERIOR CIRCULATION: --Intracranial internal carotid arteries: Normal. --Anterior cerebral arteries (ACA): Normal. Both A1 segments are present. Patent anterior communicating artery (a-comm). --Middle cerebral arteries (MCA): Normal. VENOUS SINUSES: As permitted by contrast timing, patent. ANATOMIC VARIANTS: Fetal origin of the  right posterior cerebral artery. Review of the MIP images confirms the above findings. IMPRESSION: 1. No emergent large vessel occlusion or hemodynamically significant stenosis of the head or neck. 2. 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 3. Aortic Atherosclerosis (ICD10-I70.0). Electronically Signed   By: Ulyses Jarred M.D.   On: 12/28/2021 20:17      Blood pressure (!) 154/105, pulse 75, temperature 99.1 F (37.3 C), temperature source Oral, resp. rate 17, height '6\' 1"'$  (1.854 m), weight 75.4 kg, SpO2 99 %.  Medical Problem List and Plan: 1. 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  -patient may shower  -ELOS/Goals: 7-10 days, mod I to supervision with PT, OT, SLP 2.  Antithrombotics: -DVT/anticoagulation:  Pharmaceutical: Lovenox  -antiplatelet therapy: Plavix 75 mg monotherapy 3. Pain Management: Tylenol as needed 4. Mood/Sleep: LCSW to evaluate and provide emotional support  -antipsychotic agents: n/a 5. Neuropsych/cognition: This patient is not quite capable of making decisions on his own behalf.  -use telesitter for safety 6. Skin/Wound Care: Routine skin care checks 7. Fluids/Electrolytes/Nutrition: routine Is and Os and recheck chemistries 8: Hypertension: ? home meds losartan, potassium supplement -"permissive HTN" post-CVA -norvasc 2.'5mg'$  initiated 9: Hyperlipidemia: allow permissive hypertension; home losartan held as of 6/9 10: Tobacco use: cessation counseling; continue nicotine patch 11: Alcohol use: cessation counseling 12: THC use: advised of stroke risk 13: Obesity: dietary consultation 14: H. pylori associated peptic ulcer disease: status post repeat EGD 12/09/2021 with no bleeding.  History of upper GI bleeding secondary to Mallory-Weiss tear Jan 2023  --continue PPI BID 15: Elevated creatinine:  trending down; follow-up BMP on Monday 16. Post-stroke dysphagia: D2, thins     Barbie Banner, PA-C 12/30/2021

## 2021-12-30 NOTE — TOC Initial Note (Signed)
Transition of Care [facility]) - Initial/Assessment Note    Patient Details  Name: Chad Santos MRN: [id number] Date of Birth: 1961/06/27  Transition of [facility]) CM/SW Contact:    Pollie Friar, RN Phone Number: 12/30/2021, 11:59 AM  Clinical Narrative:                 Cm received information that patients sister asked to speak to CM. CM asked the patient if ok to speak with patients sister and he voiced agreement.  CM spoke to [place]. She wanted to know about CIR. I updated her that a CIR rep would be contacting the family after they assess the patient. She states that his other sister is moving into the home he lives in so that he will have the support he needs.  TOC is following.  Expected Discharge Plan: IP Rehab Facility Barriers to Discharge: Continued Medical Work up   Patient Goals and CMS Choice   CMS Medicare.gov Compare Post Acute Care list provided to:: Patient Choice offered to / list presented to : Sibling, Patient  Expected Discharge Plan and Services Expected Discharge Plan: [place]   Discharge Planning Services: CM Consult Post Acute Care Choice: IP Rehab Living arrangements for the past 2 months: Single Family Home                                      Prior Living Arrangements/Services Living arrangements for the past 2 months: Single Family Home Lives with:: Self Patient language and need for interpreter reviewed:: Yes              Criminal Activity/Legal Involvement Pertinent to Current Situation/Hospitalization: No - Comment as needed  Activities of Daily Living      Permission Sought/Granted                  Emotional Assessment Appearance:: Appears stated age         Psych Involvement: No (comment)  Admission diagnosis:  Acute ischemic stroke ([place]) [I63.9] Acute CVA (cerebrovascular accident) [facility]) [I63.9] Patient Active Problem List   Diagnosis Date Noted   Acute CVA (cerebrovascular accident) ([place])  12/28/2021   Upper GI bleeding 08/21/2021   Essential hypertension 08/21/2021   Elevated troponin 08/21/2021   Bilateral leg pain 08/21/2021   Agitation 08/21/2021   Blood in the stool    Normocytic anemia    PCP:  Pcp, No Pharmacy:   [place], [place] - [address] [address] Phone: [phone] Fax: [phone]     Social Determinants of Health (SDOH) Interventions    Readmission Risk Interventions    08/22/2021    3:06 PM  Readmission Risk Prevention Plan  Transportation Screening Complete  PCP or Specialist Appt within 5-7 Days Complete  Home Care Screening Complete  Medication Review (RN CM) Complete

## 2021-12-30 NOTE — Evaluation (Signed)
Clinical/Bedside Swallow Evaluation Patient Details  Name: Chad Santos MRN: [id number] Date of Birth: 1961/06/08  Today's Date: 12/30/2021 Time: SLP Start Time (ACUTE ONLY): 1120 SLP Stop Time (ACUTE ONLY): 1150 SLP Time Calculation (min) (ACUTE ONLY): 30 min  Past Medical History:  Past Medical History:  Diagnosis Date   Hypertension    Past Surgical History:  Past Surgical History:  Procedure Laterality Date   BIOPSY  08/23/2021   Procedure: BIOPSY;  Surgeon: [name], MD;  Location: [facility] ENDOSCOPY;  Service: Gastroenterology;;   COLONOSCOPY WITH PROPOFOL N/A 08/23/2021   Procedure: COLONOSCOPY WITH PROPOFOL;  Surgeon: [name], MD;  Location: [place];  Service: Gastroenterology;  Laterality: N/A;   ESOPHAGOGASTRODUODENOSCOPY (EGD) WITH PROPOFOL N/A 08/23/2021   Procedure: ESOPHAGOGASTRODUODENOSCOPY (EGD) WITH PROPOFOL;  Surgeon: [name], MD;  Location: [place];  Service: Gastroenterology;  Laterality: N/A;   HAND SURGERY Right    POLYPECTOMY  08/23/2021   Procedure: POLYPECTOMY;  Surgeon: [name], MD;  Location: [facility] ENDOSCOPY;  Service: Gastroenterology;;   HPI:  61 y.o. M admitted on 12/28/21 due to slurred speech and L sided weakness. 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 PMH significant for  peptic ulcer disease with GI bleed, hypertension, tobacco abuse.    Assessment / Plan / Recommendation  Clinical Impression  Pt reported difficulty eating to SLP during conitive lingusitic eval so swallow evaluation ordered. Pt with left CN VII and XII weakness as well as impulsivity and poor awareness all impacting ability to safely self feed. Pt overloads mouth, sometimes sipping on a straw while cookie sticking out of his mouth. He needed tactile cues to stap feeding himself another whole cracker with an unmasticated cracker in his mouth. Needed cues to complete mastication, which he did with extra time, and cues  for a liquid wash. Pt to downgrade to dys 2 finely chopped food with full supervision with meals. SLP will f/u for tolerance. SLP Visit Diagnosis: Dysphagia, oral phase (R13.11)    Aspiration Risk  Mild aspiration risk    Diet Recommendation Thin liquid;Dysphagia 2 (Fine chop)   Liquid Administration via: Cup;Straw Medication Administration: Whole meds with liquid Supervision: Patient able to self feed;Full supervision/cueing for compensatory strategies Compensations: Slow rate;Small sips/bites;Lingual sweep for clearance of pocketing;Follow solids with liquid Postural Changes: Seated upright at 90 degrees    Other  Recommendations      Recommendations for follow up therapy are one component of a multi-disciplinary discharge planning process, led by the attending physician.  Recommendations may be updated based on patient status, additional functional criteria and insurance authorization.  Follow up Recommendations Acute inpatient rehab (3hours/day)      Assistance Recommended at Discharge Frequent or constant Supervision/Assistance  Functional Status Assessment Patient has had a recent decline in their functional status and demonstrates the ability to make significant improvements in function in a reasonable and predictable amount of time.  Frequency and Duration min 2x/week  2 weeks       Prognosis        Swallow Study   General HPI: 61 y.o. M admitted on 12/28/21 due to slurred speech and L sided weakness. 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 PMH significant for  peptic ulcer disease with GI bleed, hypertension, tobacco abuse. Type of Study: Bedside Swallow Evaluation Diet Prior to this Study: Regular;Thin liquids Temperature Spikes Noted: No Respiratory Status: Room air History of Recent  Intubation: No Behavior/Cognition: Alert;Cooperative;Pleasant mood Oral Cavity Assessment: Within Functional Limits Oral Care Completed by  SLP: No Oral Cavity - Dentition: Adequate natural dentition Vision: Functional for self-feeding Self-Feeding Abilities: Able to feed self Patient Positioning: Upright in bed Baseline Vocal Quality: Normal Volitional Cough: Strong Volitional Swallow: Able to elicit    Oral/Motor/Sensory Function Overall Oral Motor/Sensory Function: Moderate impairment Facial ROM: Reduced left;Suspected CN VII (facial) dysfunction Facial Symmetry: Abnormal symmetry left;Suspected CN VII (facial) dysfunction Facial Strength: Reduced left;Suspected CN VII (facial) dysfunction Facial Sensation: Reduced left;Suspected CN V (Trigeminal) dysfunction Lingual ROM: Reduced left;Suspected CN XII (hypoglossal) dysfunction Lingual Symmetry: Abnormal symmetry left;Suspected CN XII (hypoglossal) dysfunction Lingual Strength: Reduced;Suspected CN XII (hypoglossal) dysfunction Lingual Sensation: Within Functional Limits Velum: Within Functional Limits Mandible: Within Functional Limits   Ice Chips Ice chips: Not tested   Thin Liquid Thin Liquid: Within functional limits Presentation: Straw    Nectar Thick Nectar Thick Liquid: Not tested   Honey Thick Honey Thick Liquid: Not tested   Puree Puree: Within functional limits Presentation: Spoon;Self Fed   Solid     Solid: Impaired Presentation: Self Fed Oral Phase Impairments: Impaired mastication;Reduced labial seal;Reduced lingual movement/coordination Oral Phase Functional Implications: Left lateral sulci pocketing;Prolonged oral transit;Impaired mastication;Oral residue      Leigh Blas, Katherene Ponto 12/30/2021,12:31 PM

## 2021-12-30 NOTE — Progress Notes (Signed)
Physical Therapy Treatment Patient Details Name: Chad Santos MRN: [id number] DOB: 11-03-1960 Today's Date: 12/30/2021   History of Present Illness 61 y.o. M admitted on 12/28/21 due to slurred speech and L sided weakness. 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 PMH significant for  peptic ulcer disease with GI bleed, hypertension, tobacco abuse.    PT Comments    Pt making steady progress with mobility. Continues to have physical as well as cognitive deficits and feel he could benefit from acute inpatient rehab. Feel he will reach supervision level at rehab and that family will be able to manage him after that at home.    Recommendations for follow up therapy are one component of a multi-disciplinary discharge planning process, led by the attending physician.  Recommendations may be updated based on patient status, additional functional criteria and insurance authorization.  Follow Up Recommendations  Acute inpatient rehab (3hours/day)     Assistance Recommended at Discharge Frequent or constant Supervision/Assistance  Patient can return home with the following Assistance with cooking/housework;Assist for transportation;Help with stairs or ramp for entrance;A little help with walking and/or transfers;A little help with bathing/dressing/bathroom;Direct supervision/assist for medications management;Direct supervision/assist for financial management   Equipment Recommendations  Rolling walker (2 wheels)    Recommendations for Other Services       Precautions / Restrictions Precautions Precautions: Fall Restrictions Weight Bearing Restrictions: No     Mobility  Bed Mobility Overal bed mobility: Modified Independent             General bed mobility comments: Incr time    Transfers Overall transfer level: Needs assistance Equipment used: Rolling walker (2 wheels) Transfers: Sit to/from Stand Sit to Stand: Min assist            General transfer comment: Assist to bring hips up and for balance. Verbal cues for hand placment    Ambulation/Gait Ambulation/Gait assistance: Min assist Gait Distance (Feet): 150 Feet Assistive device: Rolling walker (2 wheels) Gait Pattern/deviations: Step-through pattern, Decreased stride length, Narrow base of support, Drifts right/left Gait velocity: Decreased Gait velocity interpretation: 1.31 - 2.62 ft/sec, indicative of limited community ambulator   General Gait Details: Assist for balance and to correct direction as pt running into objects on the left   Stairs             Wheelchair Mobility    Modified Rankin (Stroke Patients Only) Modified Rankin (Stroke Patients Only) Pre-Morbid Rankin Score: No significant disability Modified Rankin: Moderately severe disability     Balance Overall balance assessment: Needs assistance Sitting-balance support: No upper extremity supported, Feet supported Sitting balance-Leahy Scale: Good     Standing balance support: Bilateral upper extremity supported, During functional activity Standing balance-Leahy Scale: Poor Standing balance comment: walker and min guard for static standing                            Cognition Arousal/Alertness: Awake/alert Behavior During Therapy: WFL for tasks assessed/performed Overall Cognitive Status: Impaired/Different from baseline Area of Impairment: Attention, Following commands, Safety/judgement, Awareness, Problem solving, Memory                   Current Attention Level: Sustained Memory: Decreased short-term memory, Decreased recall of precautions Following Commands: Follows one step commands consistently, Follows one step commands with increased time Safety/Judgement: Decreased awareness of safety, Decreased awareness of deficits Awareness: Emergent Problem Solving: Slow processing, Requires verbal cues General  Comments: Pt repeatedly trying to get OOB  without assistance. lt inattention with mobility        Exercises      General Comments        Pertinent Vitals/Pain Pain Assessment Pain Assessment: No/denies pain    Home Living                          Prior Function            PT Goals (current goals can now be found in the care plan section) Acute Rehab PT Goals Patient Stated Goal: not stated Progress towards PT goals: Progressing toward goals    Frequency    Min 4X/week      PT Plan Current plan remains appropriate    Co-evaluation              AM-PAC PT "6 Clicks" Mobility   Outcome Measure  Help needed turning from your back to your side while in a flat bed without using bedrails?: None Help needed moving from lying on your back to sitting on the side of a flat bed without using bedrails?: None Help needed moving to and from a bed to a chair (including a wheelchair)?: A Little Help needed standing up from a chair using your arms (e.g., wheelchair or bedside chair)?: A Little Help needed to walk in hospital room?: A Little Help needed climbing 3-5 steps with a railing? : Total 6 Click Score: 18    End of Session Equipment Utilized During Treatment: Gait belt Activity Tolerance: Patient tolerated treatment well Patient left: with call bell/phone within reach;in chair;with chair alarm set (in bed in ED) Nurse Communication: Mobility status PT Visit Diagnosis: Unsteadiness on feet (R26.81);Muscle weakness (generalized) (M62.81);Difficulty in walking, not elsewhere classified (R26.2)     Time: 2778-2423 PT Time Calculation (min) (ACUTE ONLY): 20 min  Charges:  $Gait Training: 8-22 mins                     [place] Office [place] 12/30/2021, 5:29 PM

## 2021-12-30 NOTE — Progress Notes (Signed)
Inpatient Rehab Admissions Coordinator:   Met with pt at the bedside to discuss CIR recommendations and goals/expectations of CIR stay.  We reviewed 3 hrs/day of therapy, physician follow up, and average length of stay 2 weeks with goals of supervision assist.  Pt agreeable to pursue.  I reviewed medicaid coverage for CIR.  He gave me permission to speak to his sorry, [name], and she confirms that plan is for their sister, [name], to move in with patient (she will move this weekend), and that Vito Backers will take off of work initially, if needed for increased support.  I do think pt will reach supervision level with short rehab stay.  I will plan for potential admit pending bed availability in the next few days.  Shann Medal, PT, DPT Admissions Coordinator [phone] 12/30/21  1:20 PM

## 2021-12-30 NOTE — Plan of Care (Signed)
  Problem: Education: Goal: Knowledge of General Education information will improve Description: Including pain rating scale, medication(s)/side effects and non-pharmacologic comfort measures Outcome: Progressing   Problem: Health Behavior/Discharge Planning: Goal: Ability to manage health-related needs will improve Outcome: Progressing   Problem: Clinical Measurements: Goal: Ability to maintain clinical measurements within normal limits will improve 12/30/2021 0339 by Nichola Sizer, RN Outcome: Progressing 12/30/2021 0337 by Nichola Sizer, RN Outcome: Progressing Goal: Will remain free from infection 12/30/2021 0339 by Nichola Sizer, RN Outcome: Progressing 12/30/2021 0337 by Nichola Sizer, RN Outcome: Progressing Goal: Diagnostic test results will improve 12/30/2021 0339 by Nichola Sizer, RN Outcome: Progressing 12/30/2021 0337 by Nichola Sizer, RN Outcome: Progressing Goal: Respiratory complications will improve 12/30/2021 0339 by Nichola Sizer, RN Outcome: Progressing 12/30/2021 0337 by Nichola Sizer, RN Outcome: Progressing Goal: Cardiovascular complication will be avoided 12/30/2021 0339 by Nichola Sizer, RN Outcome: Progressing 12/30/2021 0337 by Nichola Sizer, RN Outcome: Progressing   Problem: Activity: Goal: Risk for activity intolerance will decrease 12/30/2021 0339 by Nichola Sizer, RN Outcome: Progressing 12/30/2021 0337 by Nichola Sizer, RN Outcome: Progressing   Problem: Nutrition: Goal: Adequate nutrition will be maintained 12/30/2021 0339 by Nichola Sizer, RN Outcome: Progressing 12/30/2021 0337 by Nichola Sizer, RN Outcome: Progressing   Problem: Coping: Goal: Level of anxiety will decrease Outcome: Progressing   Problem: Elimination: Goal: Will not experience complications related to bowel motility Outcome: Progressing Goal: Will not experience complications related to  urinary retention 12/30/2021 0339 by Nichola Sizer, RN Outcome: Progressing 12/30/2021 0337 by Nichola Sizer, RN Outcome: Progressing   Problem: Pain Managment: Goal: General experience of comfort will improve 12/30/2021 0339 by Nichola Sizer, RN Outcome: Progressing 12/30/2021 0337 by Nichola Sizer, RN Outcome: Progressing   Problem: Safety: Goal: Ability to remain free from injury will improve 12/30/2021 0339 by Nichola Sizer, RN Outcome: Progressing 12/30/2021 0337 by Nichola Sizer, RN Outcome: Progressing   Problem: Skin Integrity: Goal: Risk for impaired skin integrity will decrease 12/30/2021 0339 by Nichola Sizer, RN Outcome: Progressing 12/30/2021 0337 by Nichola Sizer, RN Outcome: Progressing   Problem: Education: Goal: Knowledge of patient specific risk factors will improve (INDIVIDUALIZE FOR PATIENT) Outcome: Progressing

## 2021-12-30 NOTE — Plan of Care (Signed)

## 2021-12-31 DIAGNOSIS — I639 Cerebral infarction, unspecified: Secondary | ICD-10-CM | POA: Diagnosis not present

## 2021-12-31 LAB — CBC
HCT: 43.4 % (ref 39.0–52.0)
Hemoglobin: 14.3 g/dL (ref 13.0–17.0)
MCH: 29.5 pg (ref 26.0–34.0)
MCHC: 32.9 g/dL (ref 30.0–36.0)
MCV: 89.5 fL (ref 80.0–100.0)
Platelets: 230 10*3/uL (ref 150–400)
RBC: 4.85 MIL/uL (ref 4.22–5.81)
RDW: 13.3 % (ref 11.5–15.5)
WBC: 6.3 10*3/uL (ref 4.0–10.5)
nRBC: 0 % (ref 0.0–0.2)

## 2021-12-31 MED ORDER — PANTOPRAZOLE SODIUM 40 MG PO TBEC
40.0000 mg | DELAYED_RELEASE_TABLET | Freq: Every day | ORAL | Status: DC
  Administered 2021-12-31: 40 mg via ORAL
  Filled 2021-12-31: qty 1

## 2021-12-31 MED ORDER — AMLODIPINE BESYLATE 2.5 MG PO TABS
2.5000 mg | ORAL_TABLET | Freq: Every day | ORAL | Status: DC
  Administered 2022-01-01: 2.5 mg via ORAL
  Filled 2021-12-31: qty 1

## 2021-12-31 MED ORDER — ALPRAZOLAM 0.25 MG PO TABS
0.2500 mg | ORAL_TABLET | Freq: Once | ORAL | Status: AC
Start: 2021-12-31 — End: 2021-12-31
  Administered 2021-12-31: 0.25 mg via ORAL
  Filled 2021-12-31: qty 1

## 2021-12-31 MED ORDER — MELATONIN 3 MG PO TABS
3.0000 mg | ORAL_TABLET | Freq: Every evening | ORAL | Status: DC | PRN
Start: 2021-12-31 — End: 2022-01-01
  Administered 2021-12-31 (×2): 3 mg via ORAL
  Filled 2021-12-31 (×2): qty 1

## 2021-12-31 MED ORDER — ALPRAZOLAM 0.5 MG PO TABS
0.5000 mg | ORAL_TABLET | Freq: Two times a day (BID) | ORAL | Status: DC | PRN
  Administered 2021-12-31: 0.5 mg via ORAL
  Filled 2021-12-31 (×2): qty 1

## 2021-12-31 MED ORDER — TRAZODONE HCL 100 MG PO TABS: 100.0000 mg | ORAL_TABLET | Freq: Every day | ORAL | Status: DC

## 2021-12-31 MED ORDER — TRAZODONE HCL 100 MG PO TABS
100.0000 mg | ORAL_TABLET | Freq: Every day | ORAL | Status: DC
  Administered 2021-12-31: 100 mg via ORAL
  Filled 2021-12-31: qty 1

## 2021-12-31 NOTE — Progress Notes (Signed)
PROGRESS NOTE   Chad Santos  YIA:[id number] DOB: Jan 31, 1961 DOA: 12/28/2021 PCP: Pcp, No  Brief Narrative:  61 year old black male known history HTN, insomnia, radiculopathy, prior upper GI bleed 08/21/2021 secondary to Mallory-Weiss tear-colonoscopy showing 10 mm polyp ascending colon with diverticulosis Ultimately found to have H. 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 Rest of imaging secondary to fall 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 pylori Continue PPI Protonix twice daily Seems to have completed treatment for H. pylori? Lower extremity radiculopathy-on disability for the same Requires outpatient follow-up-trial Lyrica/gabapentin if deemed stable Cannabis use, recent change in residence Met sister at bedside-only cannabis was positive on his drug screen  DVT prophylaxis: Lovenox Code Status: Full Family Communication:  D/W sister Derrell Lolling 6/8 phone number [phone] Disposition:  Status is: Inpatient Remains inpatient appropriate because: Needs further work-up   Consultants:  Neurology  Procedures:   Antimicrobials:     Subjective:  Awake sitting in chair Doesn't like soft food No other c/o mumbling poorly ineligible voice  Objective: Vitals:   12/30/21 2344 12/31/21 0324 12/31/21 0747 12/31/21 1142  BP: (!) 170/102 (!) 169/100 (!) 148/109 (!) 179/109  Pulse: 82 80 68 83  Resp: _0 Temp: 98.6 F (37 C) 98.4 F (36.9 C) (!) 97.5 F (36.4 C) 98.1 F (36.7 C)  TempSrc:  Oral Oral Oral  SpO2: 95% 96% 99% 99%  Weight:      Height:        Intake/Output Summary (Last 24 hours) at 12/31/2021 1321 Last data filed at 12/31/2021 0600 Gross per 24 hour  Intake 120 ml  Output 300 ml  Net -180 ml    Filed Weights   12/29/21 1832  Weight: 75.4 kg    Examination:  Some neglect to L side--weaker on LUE and LLE but able to raise L arm today S1 s2 no m/r/g Cta b no rales Abd soft nt nd Sensory grossly intact Plantar flexion slight weaker L side  Data Reviewed: personally reviewed   CBC    Component Value Date/Time   WBC 6.3 12/31/2021 0247   RBC 4.85 12/31/2021 0247   HGB 14.3 12/31/2021 0247   HCT 43.4 12/31/2021 0247   PLT 230 12/31/2021 0247   MCV 89.5 12/31/2021 0247   MCH 29.5 12/31/2021 0247   MCHC 32.9 12/31/2021 0247   RDW 13.3 12/31/2021 0247   LYMPHSABS 2.2 12/28/2021 1834   MONOABS 0.6 12/28/2021 1834   EOSABS 0.1 12/28/2021 1834   BASOSABS 0.0 12/28/2021 1834      Latest Ref Rng & Units 12/30/2021    2:57 AM 12/28/2021  6:46 PM 12/28/2021    6:34 PM  CMP  Glucose 70 - 99 mg/dL 103  86  88   BUN 8 - 23 mg/dL 10  19  16   Creatinine 0.61 - 1.24 mg/dL 1.27  1.30  1.35   Sodium 135 - 145 mmol/L 137  139  139   Potassium 3.5 - 5.1 mmol/L 3.6  4.1  4.2   Chloride 98 - 111 mmol/L 101  101  103   CO2 22 - 32 mmol/L 26   26   Calcium 8.9 - 10.3  mg/dL 9.1   9.5   Total Protein 6.5 - 8.1 g/dL   6.8   Total Bilirubin 0.3 - 1.2 mg/dL   0.5   Alkaline Phos 38 - 126 U/L   94   AST 15 - 41 U/L   18   ALT 0 - 44 U/L   9      Radiology Studies: ECHOCARDIOGRAM COMPLETE  Result Date: 12/29/2021    ECHOCARDIOGRAM REPORT   Patient Name:   Chad Santos Date of Exam: 12/29/2021 Medical Rec #:  [id number]        Height:       73.0 in Accession #:    [id number]       Weight:       174.0 lb Date of Birth:  09/23/1960         BSA:          2.028 m Patient Age:    61 years         BP:           166/92 mmHg Patient Gender: M                HR:           66 bpm. Exam Location:  Inpatient Procedure: 2D Echo, Cardiac Doppler and Color Doppler Indications:    Stroke  History:        Patient has no prior history of Echocardiogram examinations.                 Risk Factors:Hypertension.  Sonographer:    Taylor Peper Referring Phys: [id number] ARSHAD N KAKRAKANDY IMPRESSIONS  1. Left ventricular ejection fraction, by estimation, is 45 to 50%. The left ventricle has mildly decreased function. The left ventricle demonstrates global hypokinesis. There is mild concentric left ventricular hypertrophy. Left ventricular diastolic parameters are consistent with Grade I diastolic dysfunction (impaired relaxation).  2. Right ventricular systolic function is normal. The right ventricular size is normal. There is normal pulmonary artery systolic pressure.  3. The mitral valve is normal in structure. Trivial mitral valve regurgitation. No evidence of mitral stenosis.  4. The aortic valve is tricuspid. Aortic valve regurgitation is not visualized. No aortic stenosis is present.  5. Aortic dilatation noted. There is mild dilatation of the aortic root, measuring 39 mm.  6. The inferior vena cava is normal in size with greater than 50% respiratory variability, suggesting right atrial pressure of 3 mmHg. FINDINGS  Left Ventricle: Left ventricular ejection fraction, by estimation, is 45 to 50%.  The left ventricle has mildly decreased function. The left ventricle demonstrates global hypokinesis. The left ventricular internal cavity size was normal in size. There is  mild concentric left ventricular hypertrophy. Left ventricular diastolic parameters are consistent with Grade I diastolic dysfunction (impaired relaxation). Indeterminate filling pressures. Right Ventricle: The right ventricular size is normal. No increase in right ventricular wall thickness. Right ventricular systolic   function is normal. There is normal pulmonary artery systolic pressure. The tricuspid regurgitant velocity is 1.57 m/s, and  with an assumed right atrial pressure of 3 mmHg, the estimated right ventricular systolic pressure is 33.8 mmHg. Left Atrium: Left atrial size was normal in size. Right Atrium: Right atrial size was normal in size. Pericardium: There is no evidence of pericardial effusion. Mitral Valve: The mitral valve is normal in structure. Trivial mitral valve regurgitation. No evidence of mitral valve stenosis. Tricuspid Valve: The tricuspid valve is normal in structure. Tricuspid valve regurgitation is trivial. No evidence of tricuspid stenosis. Aortic Valve: The aortic valve is tricuspid. Aortic valve regurgitation is not visualized. No aortic stenosis is present. Aortic valve peak gradient measures 8.0 mmHg. Pulmonic Valve: The pulmonic valve was normal in structure. Pulmonic valve regurgitation is trivial. No evidence of pulmonic stenosis. Aorta: Aortic dilatation noted. There is mild dilatation of the aortic root, measuring 39 mm. Venous: The inferior vena cava is normal in size with greater than 50% respiratory variability, suggesting right atrial pressure of 3 mmHg. IAS/Shunts: No atrial level shunt detected by color flow Doppler.  LEFT VENTRICLE PLAX 2D LVIDd:         4.70 cm      Diastology LVIDs:         3.50 cm      LV e' medial:    4.35 cm/s LV PW:         1.10 cm      LV E/e' medial:  11.7 LV IVS:         1.20 cm      LV e' lateral:   5.77 cm/s LVOT diam:     2.00 cm      LV E/e' lateral: 8.9 LV SV:         64 LV SV Index:   31 LVOT Area:     3.14 cm  LV Volumes (MOD) LV vol d, MOD A2C: 146.0 ml LV vol d, MOD A4C: 151.0 ml LV vol s, MOD A2C: 73.4 ml LV vol s, MOD A4C: 86.7 ml LV SV MOD A2C:     72.6 ml LV SV MOD A4C:     151.0 ml LV SV MOD BP:      68.9 ml RIGHT VENTRICLE             IVC RV Basal diam:  2.60 cm     IVC diam: 1.60 cm RV Mid diam:    2.20 cm RV S prime:     10.60 cm/s TAPSE (M-mode): 2.0 cm LEFT ATRIUM             Index        RIGHT ATRIUM           Index LA diam:        3.60 cm 1.78 cm/m   RA Area:     15.30 cm LA Vol (A2C):   41.6 ml 20.51 ml/m  RA Volume:   37.30 ml  18.39 ml/m LA Vol (A4C):   39.7 ml 19.58 ml/m LA Biplane Vol: 41.3 ml 20.37 ml/m  AORTIC VALVE AV Area (Vmax): 2.63 cm AV Vmax:        141.00 cm/s AV Peak Grad:   8.0 mmHg LVOT Vmax:      118.00 cm/s LVOT Vmean:     80.200 cm/s LVOT VTI:       0.203 m  AORTA Ao Root diam: 3.90 cm Ao Asc diam:  3.50 cm MITRAL VALVE  TRICUSPID VALVE MV Area (PHT): 3.60 cm    TR Peak grad:   9.9 mmHg MV Decel Time: 211 msec    TR Vmax:        157.00 cm/s MV E velocity: 51.10 cm/s MV A velocity: 72.00 cm/s  SHUNTS MV E/A ratio:  0.71        Systemic VTI:  0.20 m                            Systemic Diam: 2.00 cm Skeet Latch MD Electronically signed by Skeet Latch MD Signature Date/Time: 12/29/2021/5:53:40 PM    Final      Scheduled Meds:   stroke: early stages of recovery book   Does not apply Once   atorvastatin  80 mg Oral QHS   bacitracin  1 application  Topical BID   clopidogrel  75 mg Oral Daily   enoxaparin (LOVENOX) injection  40 mg Subcutaneous Q24H   nicotine  21 mg Transdermal Daily   pantoprazole (PROTONIX) IV  40 mg Intravenous Q24H   Continuous Infusions:     LOS: 3 days   Time spent: King Salmon, MD [facility] To contact the attending provider between 7A-7P or the covering  provider during after hours 7P-7A, please log into the web site [email or web address] and access using universal [place] password for that web site. If you do not have the password, please call the hospital operator.  12/31/2021, 1:21 PM

## 2022-01-01 ENCOUNTER — Inpatient Hospital Stay (HOSPITAL_COMMUNITY)
Admission: RE | Admit: 2022-01-01 | Discharge: 2022-01-09 | DRG: 057 | Disposition: A | Discharge status: Home / Self Care | Payer: Medicaid Other | Source: Intra-hospital | Attending: Physical Medicine and Rehabilitation | Admitting: Physical Medicine and Rehabilitation

## 2022-01-01 ENCOUNTER — Other Ambulatory Visit: Payer: Self-pay

## 2022-01-01 ENCOUNTER — Encounter (HOSPITAL_COMMUNITY): Payer: Self-pay | Admitting: Physical Medicine and Rehabilitation

## 2022-01-01 DIAGNOSIS — K279 Peptic ulcer, site unspecified, unspecified as acute or chronic, without hemorrhage or perforation: Secondary | ICD-10-CM | POA: Diagnosis not present

## 2022-01-01 DIAGNOSIS — I639 Cerebral infarction, unspecified: Secondary | ICD-10-CM | POA: Diagnosis present

## 2022-01-01 DIAGNOSIS — E669 Obesity, unspecified: Secondary | ICD-10-CM | POA: Diagnosis present

## 2022-01-01 DIAGNOSIS — Z6821 Body mass index (BMI) 21.0-21.9, adult: Secondary | ICD-10-CM | POA: Diagnosis not present

## 2022-01-01 DIAGNOSIS — R1310 Dysphagia, unspecified: Secondary | ICD-10-CM | POA: Diagnosis present

## 2022-01-01 DIAGNOSIS — N189 Chronic kidney disease, unspecified: Secondary | ICD-10-CM | POA: Diagnosis not present

## 2022-01-01 DIAGNOSIS — I69354 Hemiplegia and hemiparesis following cerebral infarction affecting left non-dominant side: Principal | ICD-10-CM

## 2022-01-01 DIAGNOSIS — Z8719 Personal history of other diseases of the digestive system: Secondary | ICD-10-CM

## 2022-01-01 DIAGNOSIS — M545 Low back pain, unspecified: Secondary | ICD-10-CM | POA: Diagnosis not present

## 2022-01-01 DIAGNOSIS — G4700 Insomnia, unspecified: Secondary | ICD-10-CM | POA: Diagnosis present

## 2022-01-01 DIAGNOSIS — Z8711 Personal history of peptic ulcer disease: Secondary | ICD-10-CM | POA: Diagnosis not present

## 2022-01-01 DIAGNOSIS — S51812D Laceration without foreign body of left forearm, subsequent encounter: Secondary | ICD-10-CM | POA: Diagnosis not present

## 2022-01-01 DIAGNOSIS — I69391 Dysphagia following cerebral infarction: Secondary | ICD-10-CM

## 2022-01-01 DIAGNOSIS — I69392 Facial weakness following cerebral infarction: Secondary | ICD-10-CM | POA: Diagnosis not present

## 2022-01-01 DIAGNOSIS — F17210 Nicotine dependence, cigarettes, uncomplicated: Secondary | ICD-10-CM | POA: Diagnosis present

## 2022-01-01 DIAGNOSIS — R7989 Other specified abnormal findings of blood chemistry: Secondary | ICD-10-CM | POA: Diagnosis present

## 2022-01-01 DIAGNOSIS — K5900 Constipation, unspecified: Secondary | ICD-10-CM | POA: Diagnosis present

## 2022-01-01 DIAGNOSIS — R269 Unspecified abnormalities of gait and mobility: Secondary | ICD-10-CM | POA: Diagnosis present

## 2022-01-01 DIAGNOSIS — E785 Hyperlipidemia, unspecified: Secondary | ICD-10-CM | POA: Diagnosis present

## 2022-01-01 DIAGNOSIS — F1090 Alcohol use, unspecified, uncomplicated: Secondary | ICD-10-CM | POA: Diagnosis present

## 2022-01-01 DIAGNOSIS — R1312 Dysphagia, oropharyngeal phase: Secondary | ICD-10-CM | POA: Diagnosis not present

## 2022-01-01 DIAGNOSIS — F1290 Cannabis use, unspecified, uncomplicated: Secondary | ICD-10-CM | POA: Diagnosis present

## 2022-01-01 DIAGNOSIS — Z79899 Other long term (current) drug therapy: Secondary | ICD-10-CM

## 2022-01-01 DIAGNOSIS — I69322 Dysarthria following cerebral infarction: Secondary | ICD-10-CM

## 2022-01-01 DIAGNOSIS — I10 Essential (primary) hypertension: Secondary | ICD-10-CM | POA: Diagnosis present

## 2022-01-01 MED ORDER — ATORVASTATIN CALCIUM 80 MG PO TABS
80.0000 mg | ORAL_TABLET | Freq: Every day | ORAL | Status: DC
  Administered 2022-01-01 – 2022-01-08 (×8): 80 mg via ORAL
  Filled 2022-01-01 (×8): qty 1

## 2022-01-01 MED ORDER — ALPRAZOLAM 0.5 MG PO TABS
0.5000 mg | ORAL_TABLET | Freq: Two times a day (BID) | ORAL | Status: DC | PRN
Start: 2022-01-01 — End: 2022-01-09
  Administered 2022-01-02 – 2022-01-08 (×9): 0.5 mg via ORAL
  Filled 2022-01-01 (×9): qty 1

## 2022-01-01 MED ORDER — PANTOPRAZOLE SODIUM 40 MG PO TBEC
40.0000 mg | DELAYED_RELEASE_TABLET | Freq: Two times a day (BID) | ORAL | Status: DC
  Administered 2022-01-01 – 2022-01-09 (×16): 40 mg via ORAL
  Filled 2022-01-01 (×16): qty 1

## 2022-01-01 MED ORDER — ENOXAPARIN SODIUM 40 MG/0.4ML IJ SOSY: 40.0000 mg | PREFILLED_SYRINGE | INTRAMUSCULAR | Status: DC

## 2022-01-01 MED ORDER — BACITRACIN ZINC 500 UNIT/GM EX OINT
1.0000 "application " | TOPICAL_OINTMENT | Freq: Two times a day (BID) | CUTANEOUS | Status: DC
  Administered 2022-01-01 – 2022-01-09 (×14): 1 via TOPICAL
  Filled 2022-01-01: qty 28.4

## 2022-01-01 MED ORDER — ENOXAPARIN SODIUM 40 MG/0.4ML IJ SOSY
40.0000 mg | PREFILLED_SYRINGE | INTRAMUSCULAR | Status: DC
  Administered 2022-01-01 – 2022-01-03 (×3): 40 mg via SUBCUTANEOUS
  Filled 2022-01-01 (×3): qty 0.4

## 2022-01-01 MED ORDER — GUAIFENESIN-DM 100-10 MG/5ML PO SYRP: 5.0000 mL | ORAL_SOLUTION | Freq: Four times a day (QID) | ORAL | Status: DC | PRN

## 2022-01-01 MED ORDER — CLOPIDOGREL BISULFATE 75 MG PO TABS: 75.0000 mg | ORAL_TABLET | Freq: Every day | ORAL | Status: DC

## 2022-01-01 MED ORDER — ATORVASTATIN CALCIUM 80 MG PO TABS: 80.0000 mg | ORAL_TABLET | Freq: Every day | ORAL | Status: DC

## 2022-01-01 MED ORDER — METHOCARBAMOL 500 MG PO TABS
500.0000 mg | ORAL_TABLET | Freq: Four times a day (QID) | ORAL | Status: DC | PRN
  Administered 2022-01-03 – 2022-01-08 (×9): 500 mg via ORAL
  Filled 2022-01-01 (×9): qty 1

## 2022-01-01 MED ORDER — PROCHLORPERAZINE MALEATE 5 MG PO TABS: 5.0000 mg | ORAL_TABLET | Freq: Four times a day (QID) | ORAL | Status: DC | PRN

## 2022-01-01 MED ORDER — CLOPIDOGREL BISULFATE 75 MG PO TABS
75.0000 mg | ORAL_TABLET | Freq: Every day | ORAL | Status: DC
  Administered 2022-01-02 – 2022-01-09 (×8): 75 mg via ORAL
  Filled 2022-01-01 (×8): qty 1

## 2022-01-01 MED ORDER — PROCHLORPERAZINE EDISYLATE 10 MG/2ML IJ SOLN: 5.0000 mg | Freq: Four times a day (QID) | INTRAMUSCULAR | Status: DC | PRN

## 2022-01-01 MED ORDER — AMLODIPINE BESYLATE 2.5 MG PO TABS: 2.5000 mg | ORAL_TABLET | Freq: Every day | ORAL | Status: DC

## 2022-01-01 MED ORDER — FLEET ENEMA 7-19 GM/118ML RE ENEM: 1.0000 | ENEMA | Freq: Once | RECTAL | Status: DC | PRN

## 2022-01-01 MED ORDER — TRAZODONE HCL 100 MG PO TABS: 100.0000 mg | ORAL_TABLET | Freq: Every day | ORAL | Status: DC

## 2022-01-01 MED ORDER — NICOTINE 21 MG/24HR TD PT24
21.0000 mg | MEDICATED_PATCH | Freq: Every day | TRANSDERMAL | Status: DC
Start: 2022-01-02 — End: 2022-01-09
  Administered 2022-01-02 – 2022-01-09 (×8): 21 mg via TRANSDERMAL
  Filled 2022-01-01 (×8): qty 1

## 2022-01-01 MED ORDER — TRAZODONE HCL 50 MG PO TABS
100.0000 mg | ORAL_TABLET | Freq: Every day | ORAL | Status: DC
  Administered 2022-01-01: 100 mg via ORAL
  Filled 2022-01-01: qty 2

## 2022-01-01 MED ORDER — DIPHENHYDRAMINE HCL 12.5 MG/5ML PO ELIX
12.5000 mg | ORAL_SOLUTION | Freq: Four times a day (QID) | ORAL | Status: DC | PRN
  Filled 2022-01-01: qty 10

## 2022-01-01 MED ORDER — PROCHLORPERAZINE 25 MG RE SUPP: 12.5000 mg | Freq: Four times a day (QID) | RECTAL | Status: DC | PRN

## 2022-01-01 MED ORDER — ACETAMINOPHEN 325 MG PO TABS
325.0000 mg | ORAL_TABLET | ORAL | Status: DC | PRN
  Administered 2022-01-03 – 2022-01-07 (×7): 650 mg via ORAL
  Filled 2022-01-01 (×7): qty 2

## 2022-01-01 MED ORDER — ALPRAZOLAM 0.5 MG PO TABS: 0.5000 mg | ORAL_TABLET | Freq: Two times a day (BID) | ORAL | 0 refills | Status: DC | PRN

## 2022-01-01 MED ORDER — AMLODIPINE BESYLATE 2.5 MG PO TABS
2.5000 mg | ORAL_TABLET | Freq: Every day | ORAL | Status: DC
  Administered 2022-01-02 – 2022-01-07 (×6): 2.5 mg via ORAL
  Filled 2022-01-01 (×6): qty 1

## 2022-01-01 MED ORDER — MAGNESIUM HYDROXIDE 400 MG/5ML PO SUSP
30.0000 mL | Freq: Every day | ORAL | Status: DC | PRN
Start: 2022-01-01 — End: 2022-01-09

## 2022-01-01 MED ORDER — ALUM & MAG HYDROXIDE-SIMETH 200-200-20 MG/5ML PO SUSP: 30.0000 mL | ORAL | Status: DC | PRN

## 2022-01-01 MED ORDER — BISACODYL 10 MG RE SUPP
10.0000 mg | Freq: Every day | RECTAL | Status: DC | PRN
  Administered 2022-01-03 – 2022-01-07 (×2): 10 mg via RECTAL
  Filled 2022-01-01 (×2): qty 1

## 2022-01-01 NOTE — Progress Notes (Signed)
Admitted to unit today. Patient oriented to room and unit. Admission complete.   Yehuda Mao, LPN

## 2022-01-01 NOTE — Discharge Summary (Signed)
Physician Discharge Summary  Horrace Hanak VQQ:[id number] DOB: October 07, 1960 DOA: 12/28/2021  PCP: Pcp, No  Admit date: 12/28/2021 Discharge date: 01/01/2022  Time spent: 33 minutes  Recommendations for Outpatient Follow-up:  Get Chem-12 CBC in about 1 week at rehab Recommend outpatient neurology follow-up in about 6 weeks Recommend Plavix alone because of prior GI bleed Mallory-Weiss tear  Discharge Diagnoses:  MAIN problem for hospitalization   Acute right internal capsule stroke  Please see below for itemized issues addressed in HOpsital- refer to other progress notes for clarity if needed  Discharge Condition: Improved  Diet recommendation: Heart healthy  Filed Weights   12/29/21 1832  Weight: 75.4 kg    History of present illness:  61 year old black male known history HTN, insomnia, radiculopathy, prior upper GI bleed 08/21/2021 secondary to Mallory-Weiss tear-colonoscopy showing 10 mm polyp ascending colon with diverticulosis Ultimately found to have H. pylori treated with quadruple therapy tetracycline Flagyl bismuth pantoprazole--- underwent repeat upper endoscopy 12/09/2021 showing grade a esophagitis without bleeding normal duodenum   Presented to ER 6/7 with unilateral left-sided weakness-noted slurred speech paramedics called patient recalls falling striking left arm with laceration denies hitting head in addition to slurred speech Neurology saw the patient recommended stroke work-up MR angiography small acute infarct posterior limb right internal capsule no hemorrhage mass effect old infarct cerebellum Rest of imaging secondary to fall showed arthritis in both hips-soft tissue swelling left forearm THC was positive, BUNs/creatinine 16/1.3, hemoglobin 14 WBC 8.1  Hospital Course:  Posterior limb acute infarct R internal capsule MRI confirms-echocardiogram mild depressed EF 45%--likely OP work-up Plavix alone per neurology 2/2 risk of bleeding with aspirin Permissive  hypertension for first several days, retrograde meds in the outpatient setting Uncontrolled HTN This admission given amlodipine 2.5--titrate as op Dysphagia 2 He is at risk for aspirating--monitor when eating but he has been assigned to CIR and can probably go to Prior Mallory-Weiss tear, grade a esophagitis 5/19 on endoscopy Prior H. pylori Continue PPI Protonix twice daily Seems to have completed treatment for H. pylori? Lower extremity radiculopathy-on disability for the same Requires outpatient follow-up-trial Lyrica/gabapentin if deemed stable Cannabis use, recent change in residence Met sister at bedside-only cannabis was positive on his drug screen   Discharge Exam: Vitals:   01/01/22 0748 01/01/22 1120  BP: (!) 158/93 (!) 155/84  Pulse: 67 68  Resp: 20 20  Temp: 98.4 F (36.9 C) 98.4 F (36.9 C)  SpO2: 100% 99%    Subj on day of d/c   Awake coherent no distress Sitting up in the bed without any issue eating and drinking Much more redirectable than he was previously   General Exam on discharge  EOMI NCAT no focal deficit CTA.  No wheeze no rales no rhonchi Abdomen is soft no rebound no guarding ROM is intact he has deficits to the left upper and lower extremities power is about 4/5-he did still has a little bit of neglect on the left side but it is better  Discharge Instructions   Discharge Instructions     Diet - low sodium heart healthy   Complete by: As directed    Increase activity slowly   Complete by: As directed    No wound care   Complete by: As directed       Allergies as of 01/01/2022   No Known Allergies      Medication List     STOP taking these medications    hydrOXYzine 25 MG tablet Commonly known as: ATARAX  TAKE these medications    ALPRAZolam 0.5 MG tablet Commonly known as: XANAX Take 1 tablet (0.5 mg total) by mouth 2 (two) times daily as needed for anxiety (for agitation).   amLODipine 2.5 MG tablet Commonly  known as: NORVASC Take 1 tablet (2.5 mg total) by mouth daily. Start taking on: January 02, 2022   atorvastatin 80 MG tablet Commonly known as: LIPITOR Take 1 tablet (80 mg total) by mouth at bedtime.   clopidogrel 75 MG tablet Commonly known as: PLAVIX Take 1 tablet (75 mg total) by mouth daily. Start taking on: January 02, 2022   pantoprazole 40 MG tablet Commonly known as: PROTONIX Take 1 tablet (40 mg total) by mouth 2 (two) times daily.   traZODone 100 MG tablet Commonly known as: DESYREL Take 1 tablet (100 mg total) by mouth daily at 6 PM.       No Known Allergies    The results of significant diagnostics from this hospitalization (including imaging, microbiology, ancillary and laboratory) are listed below for reference.    Significant Diagnostic Studies: ECHOCARDIOGRAM COMPLETE  Result Date: 12/29/2021    ECHOCARDIOGRAM REPORT   Patient Name:   MANFORD SPRONG Date of Exam: 12/29/2021 Medical Rec #:  [id number]        Height:       73.0 in Accession #:    [id number]       Weight:       174.0 lb Date of Birth:  1961/05/27         BSA:          2.028 m Patient Age:    61 years         BP:           166/92 mmHg Patient Gender: M                HR:           66 bpm. Exam Location:  Inpatient Procedure: 2D Echo, Cardiac Doppler and Color Doppler Indications:    Stroke  History:        Patient has no prior history of Echocardiogram examinations.                 Risk Factors:Hypertension.  Sonographer:    Jyl Heinz Referring Phys: [place]  1. Left ventricular ejection fraction, by estimation, is 45 to 50%. The left ventricle has mildly decreased function. The left ventricle demonstrates global hypokinesis. There is mild concentric left ventricular hypertrophy. Left ventricular diastolic parameters are consistent with Grade I diastolic dysfunction (impaired relaxation).  2. Right ventricular systolic function is normal. The right ventricular size is normal. There  is normal pulmonary artery systolic pressure.  3. The mitral valve is normal in structure. Trivial mitral valve regurgitation. No evidence of mitral stenosis.  4. The aortic valve is tricuspid. Aortic valve regurgitation is not visualized. No aortic stenosis is present.  5. Aortic dilatation noted. There is mild dilatation of the aortic root, measuring 39 mm.  6. The inferior vena cava is normal in size with greater than 50% respiratory variability, suggesting right atrial pressure of 3 mmHg. FINDINGS  Left Ventricle: Left ventricular ejection fraction, by estimation, is 45 to 50%. The left ventricle has mildly decreased function. The left ventricle demonstrates global hypokinesis. The left ventricular internal cavity size was normal in size. There is  mild concentric left ventricular hypertrophy. Left ventricular diastolic parameters are consistent with Grade I diastolic dysfunction (impaired relaxation). Indeterminate  filling pressures. Right Ventricle: The right ventricular size is normal. No increase in right ventricular wall thickness. Right ventricular systolic function is normal. There is normal pulmonary artery systolic pressure. The tricuspid regurgitant velocity is 1.57 m/s, and  with an assumed right atrial pressure of 3 mmHg, the estimated right ventricular systolic pressure is 64.3 mmHg. Left Atrium: Left atrial size was normal in size. Right Atrium: Right atrial size was normal in size. Pericardium: There is no evidence of pericardial effusion. Mitral Valve: The mitral valve is normal in structure. Trivial mitral valve regurgitation. No evidence of mitral valve stenosis. Tricuspid Valve: The tricuspid valve is normal in structure. Tricuspid valve regurgitation is trivial. No evidence of tricuspid stenosis. Aortic Valve: The aortic valve is tricuspid. Aortic valve regurgitation is not visualized. No aortic stenosis is present. Aortic valve peak gradient measures 8.0 mmHg. Pulmonic Valve: The pulmonic  valve was normal in structure. Pulmonic valve regurgitation is trivial. No evidence of pulmonic stenosis. Aorta: Aortic dilatation noted. There is mild dilatation of the aortic root, measuring 39 mm. Venous: The inferior vena cava is normal in size with greater than 50% respiratory variability, suggesting right atrial pressure of 3 mmHg. IAS/Shunts: No atrial level shunt detected by color flow Doppler.  LEFT VENTRICLE PLAX 2D LVIDd:         4.70 cm      Diastology LVIDs:         3.50 cm      LV e' medial:    4.35 cm/s LV PW:         1.10 cm      LV E/e' medial:  11.7 LV IVS:        1.20 cm      LV e' lateral:   5.77 cm/s LVOT diam:     2.00 cm      LV E/e' lateral: 8.9 LV SV:         64 LV SV Index:   31 LVOT Area:     3.14 cm  LV Volumes (MOD) LV vol d, MOD A2C: 146.0 ml LV vol d, MOD A4C: 151.0 ml LV vol s, MOD A2C: 73.4 ml LV vol s, MOD A4C: 86.7 ml LV SV MOD A2C:     72.6 ml LV SV MOD A4C:     151.0 ml LV SV MOD BP:      68.9 ml RIGHT VENTRICLE             IVC RV Basal diam:  2.60 cm     IVC diam: 1.60 cm RV Mid diam:    2.20 cm RV S prime:     10.60 cm/s TAPSE (M-mode): 2.0 cm LEFT ATRIUM             Index        RIGHT ATRIUM           Index LA diam:        3.60 cm 1.78 cm/m   RA Area:     15.30 cm LA Vol (A2C):   41.6 ml 20.51 ml/m  RA Volume:   37.30 ml  18.39 ml/m LA Vol (A4C):   39.7 ml 19.58 ml/m LA Biplane Vol: 41.3 ml 20.37 ml/m  AORTIC VALVE AV Area (Vmax): 2.63 cm AV Vmax:        141.00 cm/s AV Peak Grad:   8.0 mmHg LVOT Vmax:      118.00 cm/s LVOT Vmean:     80.200 cm/s LVOT VTI:  0.203 m  AORTA Ao Root diam: 3.90 cm Ao Asc diam:  3.50 cm MITRAL VALVE               TRICUSPID VALVE MV Area (PHT): 3.60 cm    TR Peak grad:   9.9 mmHg MV Decel Time: 211 msec    TR Vmax:        157.00 cm/s MV E velocity: 51.10 cm/s MV A velocity: 72.00 cm/s  SHUNTS MV E/A ratio:  0.71        Systemic VTI:  0.20 m                            Systemic Diam: 2.00 cm Skeet Latch MD Electronically signed by  Skeet Latch MD Signature Date/Time: 12/29/2021/5:53:40 PM    Final    MR BRAIN WO CONTRAST  Result Date: 12/28/2021 CLINICAL DATA:  Left-sided weakness EXAM: MRI HEAD WITHOUT CONTRAST MRA HEAD WITHOUT CONTRAST TECHNIQUE: Multiplanar, multi-echo pulse sequences of the brain and surrounding structures were acquired without intravenous contrast. Angiographic images of the Circle of Willis were acquired using MRA technique without intravenous contrast. COMPARISON:  None Available. FINDINGS: MRI HEAD FINDINGS Brain: Small acute infarct along the posterior limb of the right internal capsule. No acute or chronic hemorrhage. There is multifocal hyperintense T2-weighted signal within the white matter. Generalized cerebral volume loss. 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 The midline structures are normal. Vascular: Major flow voids are preserved. Skull and upper cervical spine: Normal calvarium and skull base. Visualized upper cervical spine and soft tissues are normal. Sinuses/Orbits:No paranasal sinus fluid levels or advanced mucosal thickening. No mastoid or middle ear effusion. Normal orbits. MRA HEAD FINDINGS POSTERIOR CIRCULATION: --Vertebral arteries: Normal --Inferior cerebellar arteries: Normal. --Basilar artery: Normal. --Superior cerebellar arteries: Normal. --Posterior cerebral arteries: Normal. ANTERIOR CIRCULATION: --Intracranial internal carotid arteries: Normal. --Anterior cerebral arteries (ACA): Normal. --Middle cerebral arteries (MCA): Normal. ANATOMIC VARIANTS: Both posterior communicating arteries are patent. IMPRESSION: 1. Small acute infarct along the posterior limb of the right internal capsule. No hemorrhage or mass effect. 2. Normal intracranial MRA. 3. 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 Electronically Signed   By: Ulyses Jarred M.D.   On: 12/28/2021 23:44   MR ANGIO HEAD WO CONTRAST  Result Date: 12/28/2021 CLINICAL DATA:  Left-sided weakness  EXAM: MRI HEAD WITHOUT CONTRAST MRA HEAD WITHOUT CONTRAST TECHNIQUE: Multiplanar, multi-echo pulse sequences of the brain and surrounding structures were acquired without intravenous contrast. Angiographic images of the Circle of Willis were acquired using MRA technique without intravenous contrast. COMPARISON:  None Available. FINDINGS: MRI HEAD FINDINGS Brain: Small acute infarct along the posterior limb of the right internal capsule. No acute or chronic hemorrhage. There is multifocal hyperintense T2-weighted signal within the white matter. Generalized cerebral volume loss. 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 The midline structures are normal. Vascular: Major flow voids are preserved. Skull and upper cervical spine: Normal calvarium and skull base. Visualized upper cervical spine and soft tissues are normal. Sinuses/Orbits:No paranasal sinus fluid levels or advanced mucosal thickening. No mastoid or middle ear effusion. Normal orbits. MRA HEAD FINDINGS POSTERIOR CIRCULATION: --Vertebral arteries: Normal --Inferior cerebellar arteries: Normal. --Basilar artery: Normal. --Superior cerebellar arteries: Normal. --Posterior cerebral arteries: Normal. ANTERIOR CIRCULATION: --Intracranial internal carotid arteries: Normal. --Anterior cerebral arteries (ACA): Normal. --Middle cerebral arteries (MCA): Normal. ANATOMIC VARIANTS: Both posterior communicating arteries are patent. IMPRESSION: 1. Small acute  infarct along the posterior limb of the right internal capsule. No hemorrhage or mass effect. 2. Normal intracranial MRA. 3. 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 Electronically Signed   By: Ulyses Jarred M.D.   On: 12/28/2021 23:44   DG Forearm Left  Result Date: 12/28/2021 CLINICAL DATA:  Rash on arm. EXAM: LEFT FOREARM - 2 VIEW COMPARISON:  None Available. FINDINGS: There is focal soft tissue swelling over the posterior aspect of the proximal ulna. There is no radiopaque  foreign body or soft tissue gas collection. There is no underlying cortical erosion or periosteal reaction. No acute fracture or dislocation. Antecubital IV is present. IMPRESSION: 1. Soft tissue swelling of the posterior forearm. 2. No acute bony abnormality. Electronically Signed   By: Ronney Asters M.D.   On: 12/28/2021 22:22   DG Pelvis 1-2 Views  Result Date: 12/28/2021 CLINICAL DATA:  Stroke-like symptoms.  Rash on the left arm. EXAM: PELVIS - 1-2 VIEW COMPARISON:  None Available. FINDINGS: There is no acute fracture or dislocation. Moderate right and severe left hip osteoarthritic changes. There is near complete loss of left hip joint space with bone-on-bone contact. Excreted contrast noted within the urinary bladder outlining a slightly trabeculated appearing bladder wall, possibly related to chronic bladder outlet obstruction. The soft tissues are unremarkable. IMPRESSION: 1. No acute fracture or dislocation. 2. Moderate right and severe left hip osteoarthritic changes. Electronically Signed   By: Anner Crete M.D.   On: 12/28/2021 22:20   CT ANGIO HEAD NECK W WO CM  Result Date: 12/28/2021 CLINICAL DATA:  Acute neurologic deficit EXAM: CT HEAD WITHOUT CONTRAST CT ANGIOGRAPHY OF THE HEAD AND NECK TECHNIQUE: Contiguous axial images were obtained from the base of the skull through the vertex without intravenous contrast. Multidetector CT imaging of the head and neck was performed using the standard protocol during bolus administration of intravenous contrast. Multiplanar CT image reconstructions and MIPs were obtained to evaluate the vascular anatomy. Carotid stenosis measurements (when applicable) are obtained utilizing NASCET criteria, using the distal internal carotid diameter as the denominator. RADIATION DOSE REDUCTION: This exam was performed according to the departmental dose-optimization program which includes automated exposure control, adjustment of the mA and/or kV according to patient  size and/or use of iterative reconstruction technique. CONTRAST:  20m OMNIPAQUE IOHEXOL 350 MG/ML SOLN COMPARISON:  None Available. FINDINGS: CT HEAD FINDINGS Brain: There is no mass, hemorrhage or extra-axial collection. There is generalized atrophy without lobar predilection. There are old bilateral basal ganglia small vessel infarcts and left cerebellar infarct. There is hypoattenuation of the periventricular white matter, most commonly indicating chronic ischemic microangiopathy. Skull: The visualized skull base, calvarium and extracranial soft tissues are normal. Sinuses/Orbits: No fluid levels or advanced mucosal thickening of the visualized paranasal sinuses. No mastoid or middle ear effusion. The orbits are normal. CTA NECK FINDINGS SKELETON: There is no bony spinal canal stenosis. No lytic or blastic lesion. OTHER NECK: Normal pharynx, larynx and major salivary glands. No cervical lymphadenopathy. Unremarkable thyroid gland. UPPER CHEST: No pneumothorax or pleural effusion. No nodules or masses. AORTIC ARCH: There is calcific atherosclerosis of the aortic arch. There is no aneurysm, dissection or hemodynamically significant stenosis of the visualized portion of the aorta. Conventional 3 vessel aortic branching pattern. The visualized proximal subclavian arteries are widely patent. RIGHT CAROTID SYSTEM: Normal without aneurysm, dissection or stenosis. LEFT CAROTID SYSTEM: Normal without aneurysm, dissection or stenosis. VERTEBRAL ARTERIES: Codominant configuration. Both origins are clearly patent. There is no dissection,  occlusion or flow-limiting stenosis to the skull base (V1-V3 segments). CTA HEAD FINDINGS POSTERIOR CIRCULATION: --Vertebral arteries: Normal V4 segments. --Inferior cerebellar arteries: Normal. --Basilar artery: Normal. --Superior cerebellar arteries: Normal. --Posterior cerebral arteries (PCA): Normal. ANTERIOR CIRCULATION: --Intracranial internal carotid arteries: Normal. --Anterior  cerebral arteries (ACA): Normal. Both A1 segments are present. Patent anterior communicating artery (a-comm). --Middle cerebral arteries (MCA): Normal. VENOUS SINUSES: As permitted by contrast timing, patent. ANATOMIC VARIANTS: Fetal origin of the right posterior cerebral artery. Review of the MIP images confirms the above findings. IMPRESSION: 1. No emergent large vessel occlusion or hemodynamically significant stenosis of the head or neck. 2. 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 3. Aortic Atherosclerosis (ICD10-I70.0). Electronically Signed   By: Ulyses Jarred M.D.   On: 12/28/2021 20:17   CT HEAD WO CONTRAST (5MM)  Result Date: 12/28/2021 CLINICAL DATA:  Acute neurologic deficit EXAM: CT HEAD WITHOUT CONTRAST CT ANGIOGRAPHY OF THE HEAD AND NECK TECHNIQUE: Contiguous axial images were obtained from the base of the skull through the vertex without intravenous contrast. Multidetector CT imaging of the head and neck was performed using the standard protocol during bolus administration of intravenous contrast. Multiplanar CT image reconstructions and MIPs were obtained to evaluate the vascular anatomy. Carotid stenosis measurements (when applicable) are obtained utilizing NASCET criteria, using the distal internal carotid diameter as the denominator. RADIATION DOSE REDUCTION: This exam was performed according to the departmental dose-optimization program which includes automated exposure control, adjustment of the mA and/or kV according to patient size and/or use of iterative reconstruction technique. CONTRAST:  75m OMNIPAQUE IOHEXOL 350 MG/ML SOLN COMPARISON:  None Available. FINDINGS: CT HEAD FINDINGS Brain: There is no mass, hemorrhage or extra-axial collection. There is generalized atrophy without lobar predilection. There are old bilateral basal ganglia small vessel infarcts and left cerebellar infarct. There is hypoattenuation of the periventricular white matter, most commonly indicating  chronic ischemic microangiopathy. Skull: The visualized skull base, calvarium and extracranial soft tissues are normal. Sinuses/Orbits: No fluid levels or advanced mucosal thickening of the visualized paranasal sinuses. No mastoid or middle ear effusion. The orbits are normal. CTA NECK FINDINGS SKELETON: There is no bony spinal canal stenosis. No lytic or blastic lesion. OTHER NECK: Normal pharynx, larynx and major salivary glands. No cervical lymphadenopathy. Unremarkable thyroid gland. UPPER CHEST: No pneumothorax or pleural effusion. No nodules or masses. AORTIC ARCH: There is calcific atherosclerosis of the aortic arch. There is no aneurysm, dissection or hemodynamically significant stenosis of the visualized portion of the aorta. Conventional 3 vessel aortic branching pattern. The visualized proximal subclavian arteries are widely patent. RIGHT CAROTID SYSTEM: Normal without aneurysm, dissection or stenosis. LEFT CAROTID SYSTEM: Normal without aneurysm, dissection or stenosis. VERTEBRAL ARTERIES: Codominant configuration. Both origins are clearly patent. There is no dissection, occlusion or flow-limiting stenosis to the skull base (V1-V3 segments). CTA HEAD FINDINGS POSTERIOR CIRCULATION: --Vertebral arteries: Normal V4 segments. --Inferior cerebellar arteries: Normal. --Basilar artery: Normal. --Superior cerebellar arteries: Normal. --Posterior cerebral arteries (PCA): Normal. ANTERIOR CIRCULATION: --Intracranial internal carotid arteries: Normal. --Anterior cerebral arteries (ACA): Normal. Both A1 segments are present. Patent anterior communicating artery (a-comm). --Middle cerebral arteries (MCA): Normal. VENOUS SINUSES: As permitted by contrast timing, patent. ANATOMIC VARIANTS: Fetal origin of the right posterior cerebral artery. Review of the MIP images confirms the above findings. IMPRESSION: 1. No emergent large vessel occlusion or hemodynamically significant stenosis of the head or neck. 2. Old  bilateral basal ganglia and left cerebellar infarcts. 3. Aortic Atherosclerosis (ICD10-I70.0). Electronically Signed   By:  Ulyses Jarred M.D.   On: 12/28/2021 20:17    Microbiology: Recent Results (from the past 240 hour(s))  Resp Panel by RT-PCR (Flu A&B, Covid) Anterior Nasal Swab     Status: None   Collection Time: 12/28/21  9:33 PM   Specimen: Anterior Nasal Swab  Result Value Ref Range Status   SARS Coronavirus 2 by RT PCR NEGATIVE NEGATIVE Final    Comment: (NOTE) SARS-CoV-2 target nucleic acids are NOT DETECTED.  The SARS-CoV-2 RNA is generally detectable in upper respiratory specimens during the acute phase of infection. The lowest concentration of SARS-CoV-2 viral copies this assay can detect is 138 copies/mL. A negative result does not preclude SARS-Cov-2 infection and should not be used as the sole basis for treatment or other patient management decisions. A negative result may occur with  improper specimen collection/handling, submission of specimen other than nasopharyngeal swab, presence of viral mutation(s) within the areas targeted by this assay, and inadequate number of viral copies(<138 copies/mL). A negative result must be combined with clinical observations, patient history, and epidemiological information. The expected result is Negative.  Fact Sheet for Patients:  EntrepreneurPulse.com.au  Fact Sheet for Healthcare Providers:  IncredibleEmployment.be  This test is no t yet approved or cleared by the [place] FDA and  has been authorized for detection and/or diagnosis of SARS-CoV-2 by FDA under an Emergency Use Authorization (EUA). This EUA will remain  in effect (meaning this test can be used) for the duration of the COVID-19 declaration under Section 564(b)(1) of the Act, 21 U.S.C.section 360bbb-3(b)(1), unless the authorization is terminated  or revoked sooner.       Influenza A by PCR NEGATIVE NEGATIVE Final    Influenza B by PCR NEGATIVE NEGATIVE Final    Comment: (NOTE) The Xpert Xpress SARS-CoV-2/FLU/RSV plus assay is intended as an aid in the diagnosis of influenza from Nasopharyngeal swab specimens and should not be used as a sole basis for treatment. Nasal washings and aspirates are unacceptable for Xpert Xpress SARS-CoV-2/FLU/RSV testing.  Fact Sheet for Patients: EntrepreneurPulse.com.au  Fact Sheet for Healthcare Providers: IncredibleEmployment.be  This test is not yet approved or cleared by the [place] FDA and has been authorized for detection and/or diagnosis of SARS-CoV-2 by FDA under an Emergency Use Authorization (EUA). This EUA will remain in effect (meaning this test can be used) for the duration of the COVID-19 declaration under Section 564(b)(1) of the Act, 21 U.S.C. section 360bbb-3(b)(1), unless the authorization is terminated or revoked.  Performed at [facility], [place] [address] [place], [place] [address]      Labs: Basic Metabolic Panel: Recent Labs  Lab 12/28/21 1834 12/28/21 1846 12/30/21 0257  NA 139 139 137  K 4.2 4.1 3.6  CL 103 101 101  CO2 26  --  26  GLUCOSE 88 86 103*  BUN 16 19 10   CREATININE 1.35* 1.30* 1.27*  CALCIUM 9.5  --  9.1   Liver Function Tests: Recent Labs  Lab 12/28/21 1834  AST 18  ALT 9  ALKPHOS 94  BILITOT 0.5  PROT 6.8  ALBUMIN 3.6   No results for input(s): "LIPASE", "AMYLASE" in the last 168 hours. No results for input(s): "AMMONIA" in the last 168 hours. CBC: Recent Labs  Lab 12/28/21 1834 12/28/21 1846 12/31/21 0247  WBC 8.1  --  6.3  NEUTROABS 5.2  --   --   HGB 14.0 14.6 14.3  HCT 42.1 43.0 43.4  MCV 91.1  --  89.5  PLT 236  --  230   Cardiac Enzymes: No results for input(s): "CKTOTAL", "CKMB", "CKMBINDEX", "TROPONINI" in the last 168 hours. BNP: BNP (last 3 results) No results for input(s): "BNP" in the last 8760 hours.  ProBNP (last 3  results) No results for input(s): "PROBNP" in the last 8760 hours.  CBG: No results for input(s): "GLUCAP" in the last 168 hours.     Signed:  Nita Sells MD   [facility] 01/01/2022, 11:21 AM

## 2022-01-01 NOTE — Progress Notes (Signed)
Inpatient Rehabilitation Admission Medication Review by a Pharmacist  A complete drug regimen review was completed for this patient to identify any potential clinically significant medication issues.  High Risk Drug Classes Is patient taking? Indication by Medication  Antipsychotic Yes Prochlorperazine prn for nausea  Anticoagulant Yes Enoxaparin for VTE ppx  Antibiotic No   Opioid No   Antiplatelet Yes Clopidogrel for CVA  Hypoglycemics/insulin No   Vasoactive Medication Yes Amlodipine for HTN  Chemotherapy No   Other Yes Alprazolam prn for anxiety Atorvastatin for HLD Methocarbamol prn for muscle spasms Pantoprazole for PUD Trazodone for sleep     Type of Medication Issue Identified Description of Issue Recommendation(s)  Drug Interaction(s) (clinically significant)     Duplicate Therapy     Allergy     No Medication Administration End Date     Incorrect Dose     Additional Drug Therapy Needed     Significant med changes from prior encounter (inform family/care partners about these prior to discharge).    Other       Clinically significant medication issues were identified that warrant physician communication and completion of prescribed/recommended actions by midnight of the next day:  No   Time spent performing this drug regimen review (minutes):  [place] 01/01/2022 3:10 PM

## 2022-01-01 NOTE — H&P (Signed)
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  He initially called his family who noticed that his speech was slurred and EMS was called.  He fell sustaining a laceration to his left forearm but denied head trauma.  He was outside the window for tenecteplase and imaging revealed no LVO suspected.  Plavix was initiated on 6/8 and home aspirin held due to the patient's history of recent upper GI bleed.  MRI of the brain showed small right internal capsule lacunar infarct.  He had noted improvement in his dysarthria.  2D echo revealed an ejection fraction of approximately 45 to 50% with mild global hypokinesis.  Left upper extremity and left lower extremity strength approximately 4/5.  The patient is tolerating dysphagia 2 with thin liquids.  Recommend Plavix and no aspirin due to history of PUD.  H&H stable.The patient requires inpatient physical medicine and rehabilitation evaluations and treatment secondary to dysfunction due to acute lacunar infarct.   Review of Systems  Constitutional:  Negative for chills and fever.  HENT:  Negative for congestion, hearing loss and sore throat.   Eyes:  Negative for blurred vision and double vision.  Respiratory:  Positive for cough. Negative for hemoptysis and sputum production.   Cardiovascular:  Negative for chest pain and palpitations.  Gastrointestinal:  Negative for abdominal pain, constipation, nausea and vomiting.       Last BM today  Genitourinary:  Negative for dysuria, hematuria and urgency.   Musculoskeletal:  Negative for back pain and neck pain.  Neurological:  Negative for dizziness and headaches.        Past Medical History:  Diagnosis Date   Hypertension           Past Surgical History:  Procedure Laterality Date   BIOPSY   08/23/2021    Procedure: BIOPSY;  Surgeon: [name], MD;  Location: [facility] ENDOSCOPY;  Service: Gastroenterology;;   COLONOSCOPY WITH PROPOFOL N/A 08/23/2021    Procedure: COLONOSCOPY WITH PROPOFOL;  Surgeon: [name], MD;  Location: [place];  Service: Gastroenterology;  Laterality: N/A;   ESOPHAGOGASTRODUODENOSCOPY (EGD) WITH PROPOFOL N/A 08/23/2021    Procedure: ESOPHAGOGASTRODUODENOSCOPY (EGD) WITH PROPOFOL;  Surgeon: [name], MD;  Location: [place];  Service: Gastroenterology;  Laterality: N/A;   HAND SURGERY Right     POLYPECTOMY   08/23/2021    Procedure: POLYPECTOMY;  Surgeon: [name], MD;  Location: [facility] ENDOSCOPY;  Service: Gastroenterology;;         Family History  Problem Relation Age of Onset   Colon cancer Neg Hx     Esophageal cancer Neg Hx     Rectal cancer Neg Hx     Stomach cancer Neg Hx      Social History:  reports that he has been smoking cigarettes. He has a 37.00 pack-year smoking history. He has never used smokeless tobacco. He reports current alcohol use. He reports that he does not use drugs. Allergies: No Known Allergies       Medications Prior to Admission  Medication Sig Dispense Refill  hydrOXYzine (ATARAX) 25 MG tablet Take 1 tablet (25 mg total) by mouth at bedtime as needed (insomnia). 10 tablet 0   pantoprazole (PROTONIX) 40 MG tablet Take 1 tablet (40 mg total) by mouth 2 (two) times daily. 60 tablet 3          Home: Home Living Family/patient expects to be discharged to:: Private residence Living Arrangements: Alone Available Help at Discharge: Family, Available PRN/intermittently (Pt reports intermittent) Type of Home: House Home Access: Stairs to enter [employer] of Steps: 4 Entrance Stairs-Rails: Right Home Layout: One level Bathroom Shower/Tub: Chiropodist: Standard Home Equipment: Cane - single point Additional Comments: Pt's sister reports another sister will be moving into pt's home soon  Lives With: Other (Comment) (sister)   Functional History: Prior Function Prior Level of Function : Independent/Modified Independent Mobility Comments: Uses cane for mobility ADLs Comments: Reports independence   Functional Status:  Mobility: Bed Mobility Overal bed mobility: Modified Independent Transfers Overall transfer level: Needs assistance Equipment used: 2 person hand held assist Transfers: Sit to/from Stand Sit to Stand: Min assist, +2 safety/equipment General transfer comment: Min A +2 to power up and steady. Reliant on BUE for balance Ambulation/Gait Ambulation/Gait assistance: Min assist, Mod assist Gait Distance (Feet): 60 Feet Assistive device: 2 person hand held assist Gait Pattern/deviations: Step-through pattern, Decreased stride length, Narrow base of support General Gait Details: Decreased coordination noted and unsteady. LOB X2 during mobility requiring up to mod A for steadying. Gait velocity: Decreased   ADL: ADL Overall ADL's : Needs assistance/impaired Eating/Feeding: Set up, Sitting Grooming: Set up, Sitting Upper Body Bathing: Min guard, Sitting Lower Body Bathing: Minimal assistance, Sitting/lateral leans, Sit to/from stand Upper Body Dressing : Set up, Sitting Lower Body Dressing: Minimal assistance, Sitting/lateral leans, Sit to/from stand Toilet Transfer: Minimal assistance, +2 for safety/equipment, Ambulation Toileting- Clothing Manipulation and Hygiene: Minimal assistance, Sitting/lateral lean, Sit to/from stand Functional mobility during ADLs: Minimal assistance, +2 for safety/equipment General ADL Comments: Pt needing increased assist due to balance and mild decrease in LUE  coordinatino   Cognition: Cognition Overall Cognitive Status: Impaired/Different from baseline Arousal/Alertness: Awake/alert Orientation Level: Oriented X4 Attention: Focused, Sustained, Selective Focused Attention: Appears intact Sustained Attention: Appears intact Selective Attention: Impaired Selective Attention Impairment: Verbal basic, Functional basic Memory: Impaired Memory Impairment: Storage deficit, Retrieval deficit Awareness: Impaired Awareness Impairment: Emergent impairment, Anticipatory impairment Problem Solving: Impaired Problem Solving Impairment: Functional basic Executive Function: Reasoning, Self Monitoring, Self Correcting Reasoning: Impaired Reasoning Impairment: Functional basic Self Monitoring: Impaired Self Monitoring Impairment: Functional basic Self Correcting: Impaired Self Correcting Impairment: Functional basic Behaviors: Impulsive Cognition Arousal/Alertness: Awake/alert Behavior During Therapy: WFL for tasks assessed/performed Overall Cognitive Status: Impaired/Different from baseline Area of Impairment: Attention, Following commands, Safety/judgement, Awareness, Problem solving Current Attention Level: Sustained Following Commands: Follows one step commands consistently, Follows one step commands with increased time Safety/Judgement: Decreased awareness of safety, Decreased awareness of deficits Awareness: Emergent Problem Solving: Slow processing, Requires verbal cues General Comments: Pt with decreased awareness to safety and deficits, requiring increased time   Physical Exam: Blood pressure (!) 154/105, pulse 75, temperature 99.1 F (37.3 C), temperature source Oral, resp. rate 17, height '6\' 1"'$  (1.854 m), weight 75.4 kg, SpO2 99 %. Physical Exam Constitutional:      General: He is not in acute distress.    Appearance: Normal appearance. He is not ill-appearing.  HENT:     Head: Normocephalic and atraumatic.     Right Ear: External  ear normal.  Left Ear: External ear normal.     Nose: Nose normal.  Eyes:     Pupils: Pupils are equal, round, and reactive to light.  Cardiovascular:     Rate and Rhythm: Normal rate and regular rhythm.  Pulmonary:     Effort: Pulmonary effort is normal.     Breath sounds: Normal breath sounds.  Abdominal:     General: Bowel sounds are normal.     Palpations: Abdomen is soft.     Tenderness: There is no abdominal tenderness.  Musculoskeletal:        General: No swelling.     Comments: Bilateral hallux valgus  Skin:    General: Skin is warm and dry.  Neurological:     Mental Status: He is alert and oriented to person, place, and time.     Comments: Oriented. Fair insight and awareness. Able to provide some biographical information. Severe dysarthria. Left VII and X11 deficits, did not seem to be able to visually track to left or much past midline for that matter. Motor 5/5 RUE and RLE and 4+/5 LUE and LLE. No focal sensory findings.   Psychiatric:     Comments: Flat but overall cooperative and relatively pleasant              Results for orders placed or performed during the hospital encounter of 12/28/21 (from the past 48 hour(s))  Ethanol     Status: None    Collection Time: 12/28/21  6:34 PM  Result Value Ref Range    Alcohol, Ethyl (B) <10 <10 mg/dL      Comment: (NOTE) Lowest detectable limit for serum alcohol is 10 mg/dL.   For medical purposes only. Performed at [facility], [place] [address] [place], [place] [address]    Protime-INR     Status: None    Collection Time: 12/28/21  6:34 PM  Result Value Ref Range    Prothrombin Time 12.9 11.4 - 15.2 seconds    INR 1.0 0.8 - 1.2      Comment: (NOTE) INR goal varies based on device and disease states. Performed at [facility], [place] [address], [place], [place] [address]    APTT     Status: None    Collection Time: 12/28/21  6:34 PM  Result Value Ref Range    aPTT 35 24 - 36 seconds      Comment:  Performed at [facility] [address], [place], [place] [address]  CBC     Status: None    Collection Time: 12/28/21  6:34 PM  Result Value Ref Range    WBC 8.1 4.0 - 10.5 K/uL    RBC 4.62 4.22 - 5.81 MIL/uL    Hemoglobin 14.0 13.0 - 17.0 g/dL    HCT 42.1 39.0 - 52.0 %    MCV 91.1 80.0 - 100.0 fL    MCH 30.3 26.0 - 34.0 pg    MCHC 33.3 30.0 - 36.0 g/dL    RDW 13.7 11.5 - 15.5 %    Platelets 236 150 - 400 K/uL    nRBC 0.0 0.0 - 0.2 %      Comment: Performed at [facility], [place] [address] [place], [place] [address]  Differential     Status: None    Collection Time: 12/28/21  6:34 PM  Result Value Ref Range    Neutrophils Relative % 64 %    Neutro Abs 5.2 1.7 - 7.7 K/uL  Lymphocytes Relative 27 %    Lymphs Abs 2.2 0.7 - 4.0 K/uL    Monocytes Relative 7 %    Monocytes Absolute 0.6 0.1 - 1.0 K/uL    Eosinophils Relative 1 %    Eosinophils Absolute 0.1 0.0 - 0.5 K/uL    Basophils Relative 1 %    Basophils Absolute 0.0 0.0 - 0.1 K/uL    Immature Granulocytes 0 %    Abs Immature Granulocytes 0.02 0.00 - 0.07 K/uL      Comment: Performed at [facility] [address] [place], [place] [address]  Comprehensive metabolic panel     Status: Abnormal    Collection Time: 12/28/21  6:34 PM  Result Value Ref Range    Sodium 139 135 - 145 mmol/L    Potassium 4.2 3.5 - 5.1 mmol/L    Chloride 103 98 - 111 mmol/L    CO2 26 22 - 32 mmol/L    Glucose, Bld 88 70 - 99 mg/dL      Comment: Glucose reference range applies only to samples taken after fasting for at least 8 hours.    BUN 16 8 - 23 mg/dL    Creatinine, Ser 1.35 (H) 0.61 - 1.24 mg/dL    Calcium 9.5 8.9 - 10.3 mg/dL    Total Protein 6.8 6.5 - 8.1 g/dL    Albumin 3.6 3.5 - 5.0 g/dL    AST 18 15 - 41 U/L    ALT 9 0 - 44 U/L    Alkaline Phosphatase 94 38 - 126 U/L    Total Bilirubin 0.5 0.3 - 1.2 mg/dL    GFR, Estimated 59 (L) >60 mL/min      Comment: (NOTE) Calculated using the CKD-EPI Creatinine Equation  (2021)      Anion gap 10 5 - 15      Comment: Performed at [facility] [address], [place], [place] [address]  Hemoglobin A1c     Status: Abnormal    Collection Time: 12/28/21  6:44 PM  Result Value Ref Range    Hgb A1c MFr Bld 6.1 (H) 4.8 - 5.6 %      Comment: (NOTE) Pre diabetes:          5.7%-6.4%   Diabetes:              >6.4%   Glycemic control for   <7.0% adults with diabetes      Mean Plasma Glucose 128.37 mg/dL      Comment: Performed at [facility] [address], [place], [place] [address]  Lipid panel     Status: Abnormal    Collection Time: 12/28/21  6:44 PM  Result Value Ref Range    Cholesterol 189 0 - 200 mg/dL    Triglycerides 86 <150 mg/dL    HDL 56 >40 mg/dL    Total CHOL/HDL Ratio 3.4 RATIO    VLDL 17 0 - 40 mg/dL    LDL Cholesterol 116 (H) 0 - 99 mg/dL      Comment:        Total Cholesterol/HDL:CHD Risk Coronary Heart Disease Risk Table                     Men   Women  1/2 Average Risk   3.4   3.3  Average Risk       5.0   4.4  2 X Average Risk   9.6   7.1  3  X Average Risk  23.4   11.0        Use the calculated Patient Ratio above and the CHD Risk Table to determine the patient's CHD Risk.        ATP III CLASSIFICATION (LDL):  <100     mg/dL   Optimal  100-129  mg/dL   Near or Above                    Optimal  130-159  mg/dL   Borderline  160-189  mg/dL   High  >190     mg/dL   Very High Performed at [facility] [address] [place], [place] [address]    I-stat chem 8, ED     Status: Abnormal    Collection Time: 12/28/21  6:46 PM  Result Value Ref Range    Sodium 139 135 - 145 mmol/L    Potassium 4.1 3.5 - 5.1 mmol/L    Chloride 101 98 - 111 mmol/L    BUN 19 8 - 23 mg/dL    Creatinine, Ser 1.30 (H) 0.61 - 1.24 mg/dL    Glucose, Bld 86 70 - 99 mg/dL      Comment: Glucose reference range applies only to samples taken after fasting for at least 8 hours.    Calcium, Ion 1.07 (L) 1.15 - 1.40 mmol/L    TCO2 27  22 - 32 mmol/L    Hemoglobin 14.6 13.0 - 17.0 g/dL    HCT 43.0 39.0 - 52.0 %  Urine rapid drug screen (hosp performed)     Status: Abnormal    Collection Time: 12/28/21  8:37 PM  Result Value Ref Range    Opiates NONE DETECTED NONE DETECTED    Cocaine NONE DETECTED NONE DETECTED    Benzodiazepines NONE DETECTED NONE DETECTED    Amphetamines NONE DETECTED NONE DETECTED    Tetrahydrocannabinol POSITIVE (A) NONE DETECTED    Barbiturates NONE DETECTED NONE DETECTED      Comment: (NOTE) DRUG SCREEN FOR MEDICAL PURPOSES ONLY.  IF CONFIRMATION IS NEEDED FOR ANY PURPOSE, NOTIFY LAB WITHIN 5 DAYS.   LOWEST DETECTABLE LIMITS FOR URINE DRUG SCREEN Drug Class                     Cutoff (ng/mL) Amphetamine and metabolites    1000 Barbiturate and metabolites    200 Benzodiazepine                 431 Tricyclics and metabolites     300 Opiates and metabolites        300 Cocaine and metabolites        300 THC                            50 Performed at [facility], [place] [address], [place], [place] [address]    Urinalysis, Routine w reflex microscopic     Status: Abnormal    Collection Time: 12/28/21  8:37 PM  Result Value Ref Range    Color, Urine YELLOW YELLOW    APPearance CLEAR CLEAR    Specific Gravity, Urine 1.028 1.005 - 1.030    pH 7.0 5.0 - 8.0    Glucose, UA NEGATIVE NEGATIVE mg/dL    Hgb urine dipstick NEGATIVE NEGATIVE    Bilirubin Urine NEGATIVE NEGATIVE    Ketones, ur 5 (A) NEGATIVE mg/dL    Protein, ur NEGATIVE NEGATIVE  mg/dL    Nitrite NEGATIVE NEGATIVE    Leukocytes,Ua NEGATIVE NEGATIVE      Comment: Performed at [facility], [place] [address], [place], [place] [address]  Resp Panel by RT-PCR (Flu A&B, Covid) Anterior Nasal Swab     Status: None    Collection Time: 12/28/21  9:33 PM    Specimen: Anterior Nasal Swab  Result Value Ref Range    SARS Coronavirus 2 by RT PCR NEGATIVE NEGATIVE      Comment: (NOTE) SARS-CoV-2 target nucleic acids are NOT  DETECTED.   The SARS-CoV-2 RNA is generally detectable in upper respiratory specimens during the acute phase of infection. The lowest concentration of SARS-CoV-2 viral copies this assay can detect is 138 copies/mL. A negative result does not preclude SARS-Cov-2 infection and should not be used as the sole basis for treatment or other patient management decisions. A negative result may occur with  improper specimen collection/handling, submission of specimen other than nasopharyngeal swab, presence of viral mutation(s) within the areas targeted by this assay, and inadequate number of viral copies(<138 copies/mL). A negative result must be combined with clinical observations, patient history, and epidemiological information. The expected result is Negative.   Fact Sheet for Patients:  EntrepreneurPulse.com.au   Fact Sheet for Healthcare Providers:  IncredibleEmployment.be   This test is no t yet approved or cleared by the [place] FDA and  has been authorized for detection and/or diagnosis of SARS-CoV-2 by FDA under an Emergency Use Authorization (EUA). This EUA will remain  in effect (meaning this test can be used) for the duration of the COVID-19 declaration under Section 564(b)(1) of the Act, 21 U.S.C.section 360bbb-3(b)(1), unless the authorization is terminated  or revoked sooner.           Influenza A by PCR NEGATIVE NEGATIVE    Influenza B by PCR NEGATIVE NEGATIVE      Comment: (NOTE) The Xpert Xpress SARS-CoV-2/FLU/RSV plus assay is intended as an aid in the diagnosis of influenza from Nasopharyngeal swab specimens and should not be used as a sole basis for treatment. Nasal washings and aspirates are unacceptable for Xpert Xpress SARS-CoV-2/FLU/RSV testing.   Fact Sheet for Patients: EntrepreneurPulse.com.au   Fact Sheet for Healthcare Providers: IncredibleEmployment.be   This test is not  yet approved or cleared by the [place] FDA and has been authorized for detection and/or diagnosis of SARS-CoV-2 by FDA under an Emergency Use Authorization (EUA). This EUA will remain in effect (meaning this test can be used) for the duration of the COVID-19 declaration under Section 564(b)(1) of the Act, 21 U.S.C. section 360bbb-3(b)(1), unless the authorization is terminated or revoked.   Performed at [facility], [place] [address] [place], [place] [address]    Basic metabolic panel     Status: Abnormal    Collection Time: 12/30/21  2:57 AM  Result Value Ref Range    Sodium 137 135 - 145 mmol/L    Potassium 3.6 3.5 - 5.1 mmol/L    Chloride 101 98 - 111 mmol/L    CO2 26 22 - 32 mmol/L    Glucose, Bld 103 (H) 70 - 99 mg/dL      Comment: Glucose reference range applies only to samples taken after fasting for at least 8 hours.    BUN 10 8 - 23 mg/dL    Creatinine, Ser 1.27 (H) 0.61 - 1.24 mg/dL    Calcium 9.1 8.9 - 10.3 mg/dL    GFR, Estimated >60 >60  mL/min      Comment: (NOTE) Calculated using the CKD-EPI Creatinine Equation (2021)      Anion gap 10 5 - 15      Comment: Performed at [facility], [place] [address] [place], [place] [address]     Imaging Results (Last 48 hours)  ECHOCARDIOGRAM COMPLETE   Result Date: 12/29/2021    ECHOCARDIOGRAM REPORT   Patient Name:   KHADEN GATER Date of Exam: 12/29/2021 Medical Rec #:  [id number]        Height:       73.0 in Accession #:    [id number]       Weight:       174.0 lb Date of Birth:  1961/07/24         BSA:          2.028 m Patient Age:    33 years         BP:           166/92 mmHg Patient Gender: M                HR:           66 bpm. Exam Location:  Inpatient Procedure: 2D Echo, Cardiac Doppler and Color Doppler Indications:    Stroke  History:        Patient has no prior history of Echocardiogram examinations.                 Risk Factors:Hypertension.  Sonographer:    Jyl Heinz Referring Phys: Waterflow  1. Left ventricular ejection fraction, by estimation, is 45 to 50%. The left ventricle has mildly decreased function. The left ventricle demonstrates global hypokinesis. There is mild concentric left ventricular hypertrophy. Left ventricular diastolic parameters are consistent with Grade I diastolic dysfunction (impaired relaxation).  2. Right ventricular systolic function is normal. The right ventricular size is normal. There is normal pulmonary artery systolic pressure.  3. The mitral valve is normal in structure. Trivial mitral valve regurgitation. No evidence of mitral stenosis.  4. The aortic valve is tricuspid. Aortic valve regurgitation is not visualized. No aortic stenosis is present.  5. Aortic dilatation noted. There is mild dilatation of the aortic root, measuring 39 mm.  6. The inferior vena cava is normal in size with greater than 50% respiratory variability, suggesting right atrial pressure of 3 mmHg. FINDINGS  Left Ventricle: Left ventricular ejection fraction, by estimation, is 45 to 50%. The left ventricle has mildly decreased function. The left ventricle demonstrates global hypokinesis. The left ventricular internal cavity size was normal in size. There is  mild concentric left ventricular hypertrophy. Left ventricular diastolic parameters are consistent with Grade I diastolic dysfunction (impaired relaxation). Indeterminate filling pressures. Right Ventricle: The right ventricular size is normal. No increase in right ventricular wall thickness. Right ventricular systolic function is normal. There is normal pulmonary artery systolic pressure. The tricuspid regurgitant velocity is 1.57 m/s, and  with an assumed right atrial pressure of 3 mmHg, the estimated right ventricular systolic pressure is 37.6 mmHg. Left Atrium: Left atrial size was normal in size. Right Atrium: Right atrial size was normal in size. Pericardium: There is no evidence of pericardial effusion. Mitral  Valve: The mitral valve is normal in structure. Trivial mitral valve regurgitation. No evidence of mitral valve stenosis. Tricuspid Valve: The tricuspid valve is normal in structure. Tricuspid valve regurgitation is trivial. No evidence of tricuspid stenosis. Aortic Valve: The aortic valve is  tricuspid. Aortic valve regurgitation is not visualized. No aortic stenosis is present. Aortic valve peak gradient measures 8.0 mmHg. Pulmonic Valve: The pulmonic valve was normal in structure. Pulmonic valve regurgitation is trivial. No evidence of pulmonic stenosis. Aorta: Aortic dilatation noted. There is mild dilatation of the aortic root, measuring 39 mm. Venous: The inferior vena cava is normal in size with greater than 50% respiratory variability, suggesting right atrial pressure of 3 mmHg. IAS/Shunts: No atrial level shunt detected by color flow Doppler.  LEFT VENTRICLE PLAX 2D LVIDd:         4.70 cm      Diastology LVIDs:         3.50 cm      LV e' medial:    4.35 cm/s LV PW:         1.10 cm      LV E/e' medial:  11.7 LV IVS:        1.20 cm      LV e' lateral:   5.77 cm/s LVOT diam:     2.00 cm      LV E/e' lateral: 8.9 LV SV:         64 LV SV Index:   31 LVOT Area:     3.14 cm  LV Volumes (MOD) LV vol d, MOD A2C: 146.0 ml LV vol d, MOD A4C: 151.0 ml LV vol s, MOD A2C: 73.4 ml LV vol s, MOD A4C: 86.7 ml LV SV MOD A2C:     72.6 ml LV SV MOD A4C:     151.0 ml LV SV MOD BP:      68.9 ml RIGHT VENTRICLE             IVC RV Basal diam:  2.60 cm     IVC diam: 1.60 cm RV Mid diam:    2.20 cm RV S prime:     10.60 cm/s TAPSE (M-mode): 2.0 cm LEFT ATRIUM             Index        RIGHT ATRIUM           Index LA diam:        3.60 cm 1.78 cm/m   RA Area:     15.30 cm LA Vol (A2C):   41.6 ml 20.51 ml/m  RA Volume:   37.30 ml  18.39 ml/m LA Vol (A4C):   39.7 ml 19.58 ml/m LA Biplane Vol: 41.3 ml 20.37 ml/m  AORTIC VALVE AV Area (Vmax): 2.63 cm AV Vmax:        141.00 cm/s AV Peak Grad:   8.0 mmHg LVOT Vmax:      118.00 cm/s  LVOT Vmean:     80.200 cm/s LVOT VTI:       0.203 m  AORTA Ao Root diam: 3.90 cm Ao Asc diam:  3.50 cm MITRAL VALVE               TRICUSPID VALVE MV Area (PHT): 3.60 cm    TR Peak grad:   9.9 mmHg MV Decel Time: 211 msec    TR Vmax:        157.00 cm/s MV E velocity: 51.10 cm/s MV A velocity: 72.00 cm/s  SHUNTS MV E/A ratio:  0.71        Systemic VTI:  0.20 m                            Systemic  Diam: 2.00 cm Skeet Latch MD Electronically signed by Skeet Latch MD Signature Date/Time: 12/29/2021/5:53:40 PM    Final     MR BRAIN WO CONTRAST   Result Date: 12/28/2021 CLINICAL DATA:  Left-sided weakness EXAM: MRI HEAD WITHOUT CONTRAST MRA HEAD WITHOUT CONTRAST TECHNIQUE: Multiplanar, multi-echo pulse sequences of the brain and surrounding structures were acquired without intravenous contrast. Angiographic images of the Circle of Willis were acquired using MRA technique without intravenous contrast. COMPARISON:  None Available. FINDINGS: MRI HEAD FINDINGS Brain: Small acute infarct along the posterior limb of the right internal capsule. No acute or chronic hemorrhage. There is multifocal hyperintense T2-weighted signal within the white matter. Generalized cerebral volume loss. 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 The midline structures are normal. Vascular: Major flow voids are preserved. Skull and upper cervical spine: Normal calvarium and skull base. Visualized upper cervical spine and soft tissues are normal. Sinuses/Orbits:No paranasal sinus fluid levels or advanced mucosal thickening. No mastoid or middle ear effusion. Normal orbits. MRA HEAD FINDINGS POSTERIOR CIRCULATION: --Vertebral arteries: Normal --Inferior cerebellar arteries: Normal. --Basilar artery: Normal. --Superior cerebellar arteries: Normal. --Posterior cerebral arteries: Normal. ANTERIOR CIRCULATION: --Intracranial internal carotid arteries: Normal. --Anterior cerebral arteries (ACA): Normal. --Middle cerebral  arteries (MCA): Normal. ANATOMIC VARIANTS: Both posterior communicating arteries are patent. IMPRESSION: 1. Small acute infarct along the posterior limb of the right internal capsule. No hemorrhage or mass effect. 2. Normal intracranial MRA. 3. 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 Electronically Signed   By: Ulyses Jarred M.D.   On: 12/28/2021 23:44    MR ANGIO HEAD WO CONTRAST   Result Date: 12/28/2021 CLINICAL DATA:  Left-sided weakness EXAM: MRI HEAD WITHOUT CONTRAST MRA HEAD WITHOUT CONTRAST TECHNIQUE: Multiplanar, multi-echo pulse sequences of the brain and surrounding structures were acquired without intravenous contrast. Angiographic images of the Circle of Willis were acquired using MRA technique without intravenous contrast. COMPARISON:  None Available. FINDINGS: MRI HEAD FINDINGS Brain: Small acute infarct along the posterior limb of the right internal capsule. No acute or chronic hemorrhage. There is multifocal hyperintense T2-weighted signal within the white matter. Generalized cerebral volume loss. 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 The midline structures are normal. Vascular: Major flow voids are preserved. Skull and upper cervical spine: Normal calvarium and skull base. Visualized upper cervical spine and soft tissues are normal. Sinuses/Orbits:No paranasal sinus fluid levels or advanced mucosal thickening. No mastoid or middle ear effusion. Normal orbits. MRA HEAD FINDINGS POSTERIOR CIRCULATION: --Vertebral arteries: Normal --Inferior cerebellar arteries: Normal. --Basilar artery: Normal. --Superior cerebellar arteries: Normal. --Posterior cerebral arteries: Normal. ANTERIOR CIRCULATION: --Intracranial internal carotid arteries: Normal. --Anterior cerebral arteries (ACA): Normal. --Middle cerebral arteries (MCA): Normal. ANATOMIC VARIANTS: Both posterior communicating arteries are patent. IMPRESSION: 1. Small acute infarct along the posterior limb of  the right internal capsule. No hemorrhage or mass effect. 2. Normal intracranial MRA. 3. 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 Electronically Signed   By: Ulyses Jarred M.D.   On: 12/28/2021 23:44    DG Forearm Left   Result Date: 12/28/2021 CLINICAL DATA:  Rash on arm. EXAM: LEFT FOREARM - 2 VIEW COMPARISON:  None Available. FINDINGS: There is focal soft tissue swelling over the posterior aspect of the proximal ulna. There is no radiopaque foreign body or soft tissue gas collection. There is no underlying cortical erosion or periosteal reaction. No acute fracture or dislocation. Antecubital IV is present. IMPRESSION: 1. Soft tissue swelling  of the posterior forearm. 2. No acute bony abnormality. Electronically Signed   By: Ronney Asters M.D.   On: 12/28/2021 22:22    DG Pelvis 1-2 Views   Result Date: 12/28/2021 CLINICAL DATA:  Stroke-like symptoms.  Rash on the left arm. EXAM: PELVIS - 1-2 VIEW COMPARISON:  None Available. FINDINGS: There is no acute fracture or dislocation. Moderate right and severe left hip osteoarthritic changes. There is near complete loss of left hip joint space with bone-on-bone contact. Excreted contrast noted within the urinary bladder outlining a slightly trabeculated appearing bladder wall, possibly related to chronic bladder outlet obstruction. The soft tissues are unremarkable. IMPRESSION: 1. No acute fracture or dislocation. 2. Moderate right and severe left hip osteoarthritic changes. Electronically Signed   By: Anner Crete M.D.   On: 12/28/2021 22:20    CT ANGIO HEAD NECK W WO CM   Result Date: 12/28/2021 CLINICAL DATA:  Acute neurologic deficit EXAM: CT HEAD WITHOUT CONTRAST CT ANGIOGRAPHY OF THE HEAD AND NECK TECHNIQUE: Contiguous axial images were obtained from the base of the skull through the vertex without intravenous contrast. Multidetector CT imaging of the head and neck was performed using the standard protocol during bolus administration  of intravenous contrast. Multiplanar CT image reconstructions and MIPs were obtained to evaluate the vascular anatomy. Carotid stenosis measurements (when applicable) are obtained utilizing NASCET criteria, using the distal internal carotid diameter as the denominator. RADIATION DOSE REDUCTION: This exam was performed according to the departmental dose-optimization program which includes automated exposure control, adjustment of the mA and/or kV according to patient size and/or use of iterative reconstruction technique. CONTRAST:  77m OMNIPAQUE IOHEXOL 350 MG/ML SOLN COMPARISON:  None Available. FINDINGS: CT HEAD FINDINGS Brain: There is no mass, hemorrhage or extra-axial collection. There is generalized atrophy without lobar predilection. There are old bilateral basal ganglia small vessel infarcts and left cerebellar infarct. There is hypoattenuation of the periventricular white matter, most commonly indicating chronic ischemic microangiopathy. Skull: The visualized skull base, calvarium and extracranial soft tissues are normal. Sinuses/Orbits: No fluid levels or advanced mucosal thickening of the visualized paranasal sinuses. No mastoid or middle ear effusion. The orbits are normal. CTA NECK FINDINGS SKELETON: There is no bony spinal canal stenosis. No lytic or blastic lesion. OTHER NECK: Normal pharynx, larynx and major salivary glands. No cervical lymphadenopathy. Unremarkable thyroid gland. UPPER CHEST: No pneumothorax or pleural effusion. No nodules or masses. AORTIC ARCH: There is calcific atherosclerosis of the aortic arch. There is no aneurysm, dissection or hemodynamically significant stenosis of the visualized portion of the aorta. Conventional 3 vessel aortic branching pattern. The visualized proximal subclavian arteries are widely patent. RIGHT CAROTID SYSTEM: Normal without aneurysm, dissection or stenosis. LEFT CAROTID SYSTEM: Normal without aneurysm, dissection or stenosis. VERTEBRAL ARTERIES:  Codominant configuration. Both origins are clearly patent. There is no dissection, occlusion or flow-limiting stenosis to the skull base (V1-V3 segments). CTA HEAD FINDINGS POSTERIOR CIRCULATION: --Vertebral arteries: Normal V4 segments. --Inferior cerebellar arteries: Normal. --Basilar artery: Normal. --Superior cerebellar arteries: Normal. --Posterior cerebral arteries (PCA): Normal. ANTERIOR CIRCULATION: --Intracranial internal carotid arteries: Normal. --Anterior cerebral arteries (ACA): Normal. Both A1 segments are present. Patent anterior communicating artery (a-comm). --Middle cerebral arteries (MCA): Normal. VENOUS SINUSES: As permitted by contrast timing, patent. ANATOMIC VARIANTS: Fetal origin of the right posterior cerebral artery. Review of the MIP images confirms the above findings. IMPRESSION: 1. No emergent large vessel occlusion or hemodynamically significant stenosis of the head or neck. 2. 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 3. Aortic Atherosclerosis (ICD10-I70.0). Electronically Signed   By: Ulyses Jarred M.D.   On: 12/28/2021 20:17    CT HEAD WO CONTRAST (5MM)   Result Date: 12/28/2021 CLINICAL DATA:  Acute neurologic deficit EXAM: CT HEAD WITHOUT CONTRAST CT ANGIOGRAPHY OF THE HEAD AND NECK TECHNIQUE: Contiguous axial images were obtained from the base of the skull through the vertex without intravenous contrast. Multidetector CT imaging of the head and neck was performed using the standard protocol during bolus administration of intravenous contrast. Multiplanar CT image reconstructions and MIPs were obtained to evaluate the vascular anatomy. Carotid stenosis measurements (when applicable) are obtained utilizing NASCET criteria, using the distal internal carotid diameter as the denominator. RADIATION DOSE REDUCTION: This exam was performed according to the departmental dose-optimization program which includes automated exposure control, adjustment of the mA and/or kV  according to patient size and/or use of iterative reconstruction technique. CONTRAST:  74m OMNIPAQUE IOHEXOL 350 MG/ML SOLN COMPARISON:  None Available. FINDINGS: CT HEAD FINDINGS Brain: There is no mass, hemorrhage or extra-axial collection. There is generalized atrophy without lobar predilection. There are old bilateral basal ganglia small vessel infarcts and left cerebellar infarct. There is hypoattenuation of the periventricular white matter, most commonly indicating chronic ischemic microangiopathy. Skull: The visualized skull base, calvarium and extracranial soft tissues are normal. Sinuses/Orbits: No fluid levels or advanced mucosal thickening of the visualized paranasal sinuses. No mastoid or middle ear effusion. The orbits are normal. CTA NECK FINDINGS SKELETON: There is no bony spinal canal stenosis. No lytic or blastic lesion. OTHER NECK: Normal pharynx, larynx and major salivary glands. No cervical lymphadenopathy. Unremarkable thyroid gland. UPPER CHEST: No pneumothorax or pleural effusion. No nodules or masses. AORTIC ARCH: There is calcific atherosclerosis of the aortic arch. There is no aneurysm, dissection or hemodynamically significant stenosis of the visualized portion of the aorta. Conventional 3 vessel aortic branching pattern. The visualized proximal subclavian arteries are widely patent. RIGHT CAROTID SYSTEM: Normal without aneurysm, dissection or stenosis. LEFT CAROTID SYSTEM: Normal without aneurysm, dissection or stenosis. VERTEBRAL ARTERIES: Codominant configuration. Both origins are clearly patent. There is no dissection, occlusion or flow-limiting stenosis to the skull base (V1-V3 segments). CTA HEAD FINDINGS POSTERIOR CIRCULATION: --Vertebral arteries: Normal V4 segments. --Inferior cerebellar arteries: Normal. --Basilar artery: Normal. --Superior cerebellar arteries: Normal. --Posterior cerebral arteries (PCA): Normal. ANTERIOR CIRCULATION: --Intracranial internal carotid arteries:  Normal. --Anterior cerebral arteries (ACA): Normal. Both A1 segments are present. Patent anterior communicating artery (a-comm). --Middle cerebral arteries (MCA): Normal. VENOUS SINUSES: As permitted by contrast timing, patent. ANATOMIC VARIANTS: Fetal origin of the right posterior cerebral artery. Review of the MIP images confirms the above findings. IMPRESSION: 1. No emergent large vessel occlusion or hemodynamically significant stenosis of the head or neck. 2. 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 3. Aortic Atherosclerosis (ICD10-I70.0). Electronically Signed   By: KUlyses JarredM.D.   On: 12/28/2021 20:17          Blood pressure (!) 154/105, pulse 75, temperature 99.1 F (37.3 C), temperature source Oral, resp. rate 17, height '6\' 1"'$  (1.854 m), weight 75.4 kg, SpO2 99 %.   Medical Problem List and Plan: 1. 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             -patient may shower             -ELOS/Goals: 7-10 days,  mod I to supervision with PT, OT, SLP 2.  Antithrombotics: -DVT/anticoagulation:  Pharmaceutical: Lovenox             -antiplatelet therapy: Plavix 75 mg monotherapy 3. Pain Management: Tylenol as needed 4. Mood/Sleep: LCSW to evaluate and provide emotional support             -antipsychotic agents: n/a 5. Neuropsych/cognition: This patient is not quite capable of making decisions on his own behalf.             -use telesitter for safety 6. Skin/Wound Care: Routine skin care checks 7. Fluids/Electrolytes/Nutrition: routine Is and Os and recheck chemistries 8: Hypertension: ? home meds losartan, potassium supplement -"permissive HTN" post-CVA -norvasc 2.'5mg'$  initiated 9: Hyperlipidemia: allow permissive hypertension; home losartan held as of 6/9 10: Tobacco use: cessation counseling; continue nicotine  patch 11: Alcohol use: cessation counseling 12: THC use: advised of stroke risk 13: Obesity: dietary consultation 14: H. pylori associated peptic ulcer disease: status post repeat EGD 12/09/2021 with no bleeding.  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  Additionally, I have personally reviewed laboratory data, imaging studies, as well as relevant notes and concur with the physician assistant's documentation above.  The patient's status has not changed from the original H&P.  Any changes in documentation from the acute care chart have been noted above.  Meredith Staggers, MD, [facility] Drown

## 2022-01-01 NOTE — Plan of Care (Signed)
  Problem: Education: Goal: Knowledge of General Education information will improve Description: Including pain rating scale, medication(s)/side effects and non-pharmacologic comfort measures Outcome: Adequate for Discharge   Problem: Health Behavior/Discharge Planning: Goal: Ability to manage health-related needs will improve Outcome: Adequate for Discharge   Problem: Clinical Measurements: Goal: Ability to maintain clinical measurements within normal limits will improve Outcome: Adequate for Discharge Goal: Will remain free from infection Outcome: Adequate for Discharge Goal: Diagnostic test results will improve Outcome: Adequate for Discharge Goal: Respiratory complications will improve Outcome: Adequate for Discharge Goal: Cardiovascular complication will be avoided Outcome: Adequate for Discharge   Problem: Activity: Goal: Risk for activity intolerance will decrease Outcome: Adequate for Discharge   Problem: Nutrition: Goal: Adequate nutrition will be maintained Outcome: Adequate for Discharge   Problem: Coping: Goal: Level of anxiety will decrease Outcome: Adequate for Discharge   Problem: Elimination: Goal: Will not experience complications related to bowel motility Outcome: Adequate for Discharge Goal: Will not experience complications related to urinary retention Outcome: Adequate for Discharge   Problem: Pain Managment: Goal: General experience of comfort will improve Outcome: Adequate for Discharge   Problem: Safety: Goal: Ability to remain free from injury will improve Outcome: Adequate for Discharge   Problem: Skin Integrity: Goal: Risk for impaired skin integrity will decrease Outcome: Adequate for Discharge   Problem: Education: Goal: Knowledge of patient specific risk factors will improve (INDIVIDUALIZE FOR PATIENT) Outcome: Adequate for Discharge

## 2022-01-02 DIAGNOSIS — K5900 Constipation, unspecified: Secondary | ICD-10-CM | POA: Diagnosis not present

## 2022-01-02 DIAGNOSIS — I639 Cerebral infarction, unspecified: Secondary | ICD-10-CM | POA: Diagnosis not present

## 2022-01-02 DIAGNOSIS — N189 Chronic kidney disease, unspecified: Secondary | ICD-10-CM

## 2022-01-02 DIAGNOSIS — I10 Essential (primary) hypertension: Secondary | ICD-10-CM | POA: Diagnosis not present

## 2022-01-02 LAB — COMPREHENSIVE METABOLIC PANEL
ALT: 10 U/L (ref 0–44)
AST: 16 U/L (ref 15–41)
Albumin: 3.5 g/dL (ref 3.5–5.0)
Alkaline Phosphatase: 86 U/L (ref 38–126)
Anion gap: 9 (ref 5–15)
BUN: 23 mg/dL (ref 8–23)
CO2: 24 mmol/L (ref 22–32)
Calcium: 8.8 mg/dL — ABNORMAL LOW (ref 8.9–10.3)
Chloride: 104 mmol/L (ref 98–111)
Creatinine, Ser: 1.54 mg/dL — ABNORMAL HIGH (ref 0.61–1.24)
GFR, Estimated: 51 mL/min — ABNORMAL LOW (ref >60)
Glucose, Bld: 93 mg/dL (ref 70–99)
Potassium: 4.2 mmol/L (ref 3.5–5.1)
Sodium: 137 mmol/L (ref 135–145)
Total Bilirubin: 0.9 mg/dL (ref 0.3–1.2)
Total Protein: 6.8 g/dL (ref 6.5–8.1)

## 2022-01-02 LAB — CBC WITH DIFFERENTIAL/PLATELET
Abs Immature Granulocytes: 0.01 10*3/uL (ref 0.00–0.07)
Basophils Absolute: 0 10*3/uL (ref 0.0–0.1)
Basophils Relative: 1 %
Eosinophils Absolute: 0.1 10*3/uL (ref 0.0–0.5)
Eosinophils Relative: 1 %
HCT: 42.3 % (ref 39.0–52.0)
Hemoglobin: 14.1 g/dL (ref 13.0–17.0)
Immature Granulocytes: 0 %
Lymphocytes Relative: 39 %
Lymphs Abs: 2.1 10*3/uL (ref 0.7–4.0)
MCH: 29.3 pg (ref 26.0–34.0)
MCHC: 33.3 g/dL (ref 30.0–36.0)
MCV: 87.8 fL (ref 80.0–100.0)
Monocytes Absolute: 0.4 10*3/uL (ref 0.1–1.0)
Monocytes Relative: 8 %
Neutro Abs: 2.7 10*3/uL (ref 1.7–7.7)
Neutrophils Relative %: 51 %
Platelets: 232 10*3/uL (ref 150–400)
RBC: 4.82 MIL/uL (ref 4.22–5.81)
RDW: 13.4 % (ref 11.5–15.5)
WBC: 5.3 10*3/uL (ref 4.0–10.5)
nRBC: 0 % (ref 0.0–0.2)

## 2022-01-02 MED ORDER — POLYETHYLENE GLYCOL 3350 17 G PO PACK: 17.0000 g | PACK | Freq: Every day | ORAL | Status: DC | PRN

## 2022-01-02 MED ORDER — MELATONIN 3 MG PO TABS
3.0000 mg | ORAL_TABLET | Freq: Every day | ORAL | Status: DC
  Administered 2022-01-02 – 2022-01-03 (×2): 3 mg via ORAL
  Filled 2022-01-02 (×2): qty 1

## 2022-01-02 MED ORDER — TRAZODONE HCL 50 MG PO TABS
100.0000 mg | ORAL_TABLET | Freq: Every day | ORAL | Status: DC
  Administered 2022-01-02 – 2022-01-04 (×3): 100 mg via ORAL
  Filled 2022-01-02 (×3): qty 2

## 2022-01-02 NOTE — Plan of Care (Signed)
  Problem: RH Balance Goal: LTG Patient will maintain dynamic standing with ADLs (OT) Description: LTG:  Patient will maintain dynamic standing balance with assist during activities of daily living (OT)  Flowsheets (Taken 01/02/2022 1625) LTG: Pt will maintain dynamic standing balance during ADLs with: Supervision/Verbal cueing   Problem: Sit to Stand Goal: LTG:  Patient will perform sit to stand in prep for activites of daily living with assistance level (OT) Description: LTG:  Patient will perform sit to stand in prep for activites of daily living with assistance level (OT) Flowsheets (Taken 01/02/2022 1625) LTG: PT will perform sit to stand in prep for activites of daily living with assistance level: Independent with assistive device   Problem: RH Bathing Goal: LTG Patient will bathe all body parts with assist levels (OT) Description: LTG: Patient will bathe all body parts with assist levels (OT) Flowsheets (Taken 01/02/2022 1625) LTG: Pt will perform bathing with assistance level/cueing: Supervision/Verbal cueing LTG: Position pt will perform bathing: Shower   Problem: RH Dressing Goal: LTG Patient will perform lower body dressing w/assist (OT) Description: LTG: Patient will perform lower body dressing with assist, with/without cues in positioning using equipment (OT) Flowsheets (Taken 01/02/2022 1625) LTG: Pt will perform lower body dressing with assistance level of: Supervision/Verbal cueing   Problem: RH Toileting Goal: LTG Patient will perform toileting task (3/3 steps) with assistance level (OT) Description: LTG: Patient will perform toileting task (3/3 steps) with assistance level (OT)  Flowsheets (Taken 01/02/2022 1625) LTG: Pt will perform toileting task (3/3 steps) with assistance level: Supervision/Verbal cueing   Problem: RH Functional Use of Upper Extremity Goal: LTG Patient will use RT/LT upper extremity as a (OT) Description: LTG: Patient will use right/left upper  extremity as a stabilizer/gross assist/diminished/nondominant/dominant level with assist, with/without cues during functional activity (OT) Flowsheets (Taken 01/02/2022 1625) LTG: Use of upper extremity in functional activities: LUE as gross assist level LTG: Pt will use upper extremity in functional activity with assistance level of: Independent   Problem: RH Toilet Transfers Goal: LTG Patient will perform toilet transfers w/assist (OT) Description: LTG: Patient will perform toilet transfers with assist, with/without cues using equipment (OT) Flowsheets (Taken 01/02/2022 1625) LTG: Pt will perform toilet transfers with assistance level of: Independent with assistive device   Problem: RH Tub/Shower Transfers Goal: LTG Patient will perform tub/shower transfers w/assist (OT) Description: LTG: Patient will perform tub/shower transfers with assist, with/without cues using equipment (OT) Flowsheets (Taken 01/02/2022 1625) LTG: Pt will perform tub/shower stall transfers with assistance level of: Supervision/Verbal cueing LTG: Pt will perform tub/shower transfers from: Tub/shower combination   Problem: RH Awareness Goal: LTG: Patient will demonstrate awareness during functional activites type of (OT) Description: LTG: Patient will demonstrate awareness during functional activites type of (OT) Flowsheets (Taken 01/02/2022 1625) Patient will demonstrate awareness during functional activites type of: Anticipatory LTG: Patient will demonstrate awareness during functional activites type of (OT): Supervision

## 2022-01-02 NOTE — Progress Notes (Signed)
[name], male   DOB: 09/13/1960, 61 y.o.   MRN: [id number] Met with the patient to review current state, rehab process, team conference and plan of care. Discussed secondary risk after stroke including HTN, smoking cessation and ETOH use and strokes. Reviewed medications and dietary modification recommendations along with heart healthy/low salt diet and prediabetes. Patient noted back pain and anxiety; reviewed issues with nursing. Patient reports sister does the cooking at home and he has 4 ste to the home. Continue to follow along to discharge to address educational needs to facilitate preparation for discharge home. Hervey Ard, Dalbert Batman

## 2022-01-02 NOTE — Progress Notes (Signed)
PMR Admission Coordinator Pre-Admission Assessment   Patient: Chad Santos is an 61 y.o., male MRN: [id number] DOB: 01/22/1961 Height: 6' 1" (185.4 cm) Weight: 75.4 kg   Insurance Information HMO:       PPO:      PCP:      IPA:      80/20:      OTHER:  PRIMARY: Medicaid [place]      Policy#: [id number]      Subscriber: patient CM Name:       Phone#:      Fax#:  Pre-Cert#:       Employer:  Benefits:  Phone #:      Name:  Eff. Date: verified 12/30/21     Deduct: $0      Out of Pocket Max: $0      Life Max: n/a CIR: 100%      SNF:  Outpatient:      Co-Pay:  Home Health:       Co-Pay:  DME:      Co-Pay:  Providers:  SECONDARY:       Policy#:      Phone#:    Financial Counselor:       Phone#:    The "Data Collection Information Summary" for patients in Inpatient Rehabilitation Facilities with attached "Privacy Act Statement-Health Care Records" was provided and verbally reviewed with: N/A    Emergency Contact Information Contact Information       Name Relation Home Work Mobile    [name],[name] Sister     [phone]    [name],[name] Sister     [phone]           Current Medical History  Patient Admitting Diagnosis: CVA History of Present Illness: Patient is a 61 year old male admitted from home after fall. Patient found by family to have slurred speech and left sided weakness.  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 2D echo pending, recommend permissive hypertension.  PMH includes GI bleed, HTN, tobacco abuse.  Therapies recommending intensive short term inpatient rehab for PT, OT and speech therapy.      Complete NIHSS TOTAL: 5   Patient's medical record from [facility] has been reviewed by the rehabilitation admission coordinator and physician.   Past Medical History      Past Medical History:  Diagnosis Date   Hypertension        Has the  patient had major surgery during 100 days prior to admission? No   Family History   family history is not on file.   Current Medications   Current Facility-Administered Medications:     stroke: early stages of recovery book, , Does not apply, Once, Kakrakandy, Arshad N, MD   acetaminophen (TYLENOL) tablet 650 mg, 650 mg, Oral, Q4H PRN, 650 mg at 12/30/21 0635 **OR** acetaminophen (TYLENOL) 160 MG/5ML solution 650 mg, 650 mg, Per Tube, Q4H PRN **OR** acetaminophen (TYLENOL) suppository 650 mg, 650 mg, Rectal, Q4H PRN, Kakrakandy, Arshad N, MD   ALPRAZolam (XANAX) tablet 0.25 mg, 0.25 mg, Oral, BID PRN, Samtani, Jai-Gurmukh, MD, 0.25 mg at 12/30/21 1042   atorvastatin (LIPITOR) tablet 80 mg, 80 mg, Oral, QHS, Kakrakandy, Arshad N, MD, 80 mg at 12/29/21 2151   bacitracin ointment 1 application., 1 application , Topical, BID, Kakrakandy, Arshad N, MD, 1 application  at 12/30/21 1008   clopidogrel (PLAVIX) tablet 75 mg, 75 mg, Oral, Daily, Shafer,   Devon, NP, 75 mg at 12/30/21 1007   enoxaparin (LOVENOX) injection 40 mg, 40 mg, Subcutaneous, Q24H, Rise Patience, MD, 40 mg at 12/29/21 2151   hydrALAZINE (APRESOLINE) injection 5 mg, 5 mg, Intravenous, Q4H PRN, Rise Patience, MD, 5 mg at 12/29/21 1425   nicotine (NICODERM CQ - dosed in mg/24 hours) patch 21 mg, 21 mg, Transdermal, Daily, Shafer, Devon, NP, 21 mg at 12/30/21 1007   pantoprazole (PROTONIX) injection 40 mg, 40 mg, Intravenous, Q24H, Rise Patience, MD, 40 mg at 12/29/21 2151   Patients Current Diet:  Diet Order                  DIET DYS 2 Room service appropriate? Yes; Fluid consistency: Thin  Diet effective now                         Precautions / Restrictions Precautions Precautions: Fall Restrictions Weight Bearing Restrictions: No    Has the patient had 2 or more falls or a fall with injury in the past year? No   Prior Activity Level Household: Patient was household ambulator with [facility] prior to  admission   Prior Functional Level Self Care: Did the patient need help bathing, dressing, using the toilet or eating? Independent   Indoor Mobility: Did the patient need assistance with walking from room to room (with or without device)? Independent   Stairs: Did the patient need assistance with internal or external stairs (with or without device)? Independent   Functional Cognition: Did the patient need help planning regular tasks such as shopping or remembering to take medications? Independent   Patient Information Are you of Hispanic, Latino/a,or Spanish origin?: A. No, not of Hispanic, Latino/a, or Spanish origin What is your race?: B. Black or African American Do you need or want an interpreter to communicate with a doctor or health care staff?: 0. No   Patient's Response To:  Health Literacy and Transportation Is the patient able to respond to health literacy and transportation needs?: Yes Health Literacy - How often do you need to have someone help you when you read instructions, pamphlets, or other written material from your doctor or pharmacy?: Sometimes In the past 12 months, has lack of transportation kept you from medical appointments or from getting medications?: No In the past 12 months, has lack of transportation kept you from meetings, work, or from getting things needed for daily living?: No   Home Assistive Devices / [place]: Kasandra Knudsen - single point   Prior Device Use: Indicate devices/aids used by the patient prior to current illness, exacerbation or injury? None of the above   Current Functional Level Cognition   Arousal/Alertness: Awake/alert Overall Cognitive Status: Impaired/Different from baseline Current Attention Level: Sustained Orientation Level: Oriented X4 Following Commands: Follows one step commands consistently, Follows one step commands with increased time Safety/Judgement: Decreased awareness of safety, Decreased awareness of  deficits General Comments: Pt with decreased awareness to safety and deficits, requiring increased time Attention: Focused, Sustained, Selective Focused Attention: Appears intact Sustained Attention: Appears intact Selective Attention: Impaired Selective Attention Impairment: Verbal basic, Functional basic Memory: Impaired Memory Impairment: Storage deficit, Retrieval deficit Awareness: Impaired Awareness Impairment: Emergent impairment, Anticipatory impairment Problem Solving: Impaired Problem Solving Impairment: Functional basic Executive Function: Reasoning, Self Monitoring, Self Correcting Reasoning: Impaired Reasoning Impairment: Functional basic Self Monitoring: Impaired Self Monitoring Impairment: Functional basic Self Correcting: Impaired Self Correcting Impairment: Functional basic Behaviors: Impulsive    Extremity  Assessment (includes Sensation/Coordination)   Upper Extremity Assessment: Defer to OT evaluation LUE Deficits / Details: decreased coordination and mild weakness LUE Sensation: decreased light touch LUE Coordination: decreased fine motor  Lower Extremity Assessment: LLE deficits/detail LLE Deficits / Details: Grossly 3/5 in hip flexors and 4/5 in knee extensors. Decreased coordination noted     ADLs   Overall ADL's : Needs assistance/impaired Eating/Feeding: Set up, Sitting Grooming: Set up, Sitting Upper Body Bathing: Min guard, Sitting Lower Body Bathing: Minimal assistance, Sitting/lateral leans, Sit to/from stand Upper Body Dressing : Set up, Sitting Lower Body Dressing: Minimal assistance, Sitting/lateral leans, Sit to/from stand Toilet Transfer: Minimal assistance, +2 for safety/equipment, Ambulation Toileting- Clothing Manipulation and Hygiene: Minimal assistance, Sitting/lateral lean, Sit to/from stand Functional mobility during ADLs: Minimal assistance, +2 for safety/equipment General ADL Comments: Pt needing increased assist due to balance and  mild decrease in LUE coordinatino     Mobility   Overal bed mobility: Modified Independent     Transfers   Overall transfer level: Needs assistance Equipment used: 2 person hand held assist Transfers: Sit to/from Stand Sit to Stand: Min assist, +2 safety/equipment General transfer comment: Min A +2 to power up and steady. Reliant on BUE for balance     Ambulation / Gait / Stairs / Wheelchair Mobility   Ambulation/Gait Ambulation/Gait assistance: Min assist, Mod assist Gait Distance (Feet): 60 Feet Assistive device: 2 person hand held assist Gait Pattern/deviations: Step-through pattern, Decreased stride length, Narrow base of support General Gait Details: Decreased coordination noted and unsteady. LOB X2 during mobility requiring up to mod A for steadying. Gait velocity: Decreased     Posture / Balance Balance Overall balance assessment: Needs assistance Sitting-balance support: No upper extremity supported, Feet supported Sitting balance-Leahy Scale: Good Standing balance support: Bilateral upper extremity supported, During functional activity Standing balance-Leahy Scale: Poor Standing balance comment: Reliant on UE and external support     Special needs/care consideration N/a    Previous Home Environment (from acute therapy documentation) Living Arrangements: Alone  Lives With: Other (Comment) (sister) Available Help at Discharge: Family, Available PRN/intermittently (Pt reports intermittent) Type of Home: House Home Layout: One level Home Access: Stairs to enter Entrance Stairs-Rails: Right Entrance Stairs-Number of Steps: 4 Bathroom Shower/Tub: Tub/shower unit Bathroom Toilet: Standard Additional Comments: Pt's sister reports another sister will be moving into pt's home soon   Discharge Living Setting Plans for Discharge Living Setting: Patient's home, Lives with (comment) (sister) Type of Home at Discharge: House Discharge Home Layout: One level Discharge Home  Access: Stairs to enter Entrance Stairs-Rails: Right Entrance Stairs-Number of Steps: 4 Discharge Bathroom Shower/Tub: Tub/shower unit Discharge Bathroom Toilet: Standard Does the patient have any problems obtaining your medications?: No   Social/Family/Support Systems Anticipated Caregiver: Sister, [name], Sister [name] Anticipated Caregiver's Contact Information: [phone], [name] Ability/Limitations of Caregiver: no Caregiver Availability: 24/7 Discharge Plan Discussed with Primary Caregiver: Yes Is Caregiver In Agreement with Plan?: Yes Does Caregiver/Family have Issues with Lodging/Transportation while Pt is in Rehab?: No   Goals Patient/Family Goal for Rehab: For patient to return to supervision level, or mod I level with mobility Expected length of stay: 7-10 days Pt/Family Agrees to Admission and willing to participate: Yes Program Orientation Provided & Reviewed with Pt/Caregiver Including Roles  & Responsibilities: Yes  Barriers to Discharge: Insurance for SNF coverage   Decrease burden of Care through IP rehab admission: n/a   Possible need for SNF placement upon discharge: Not anticpated   Patient Condition: I have reviewed medical   records from [facility], spoken with  [facility] team , and patient and family member. I met with patient at the bedside for inpatient rehabilitation assessment.  Patient will benefit from ongoing PT, OT, and SLP, can actively participate in 3 hours of therapy a day 5 days of the week, and can make measurable gains during the admission.  Patient will also benefit from the coordinated team approach during an Inpatient Acute Rehabilitation admission.  The patient will receive intensive therapy as well as Rehabilitation physician, nursing, social worker, and care management interventions.  Due to safety, disease management, medication administration, and patient education the patient requires 24 hour a day rehabilitation nursing.  The patient is  currently min A/modA with 2 people with mobility and basic ADLs.  Discharge setting and therapy post discharge at home with home health is anticipated.  Patient has agreed to participate in the Acute Inpatient Rehabilitation Program and will admit 01/01/22.   Preadmission Screen Completed ByAmanda Cockayne, MPT 12/30/2021 3:32 PM ______________________________________________________________________   Discussed status with Dr. Naaman Plummer on 12/30/21 at 1530 and received approval for admission today.   Admission Coordinator:  Conetta,Kristyn, PT, time 3:32 PM/Date 12/30/21.  Updated by Dr. Naaman Plummer today.     Assessment/Plan: Diagnosis: left PLIC infarct Does the need for close, 24 hr/day Medical supervision in concert with the patient's rehab needs make it unreasonable for this patient to be served in a less intensive setting? Yes Co-Morbidities requiring supervision/potential complications: esophagitis, hypertension, lumbar radiculopathy Due to bladder management, bowel management, safety, skin/wound care, disease management, medication administration, pain management, and patient education, does the patient require 24 hr/day rehab nursing? Yes Does the patient require coordinated care of a physician, rehab nurse, PT, OT, and SLP to address physical and functional deficits in the context of the above medical diagnosis(es)? Yes Addressing deficits in the following areas: balance, endurance, locomotion, strength, transferring, bowel/bladder control, bathing, dressing, feeding, grooming, toileting, cognition, speech, swallowing, and psychosocial support Can the patient actively participate in an intensive therapy program of at least 3 hrs of therapy 5 days a week? Yes The potential for patient to make measurable gains while on inpatient rehab is excellent Anticipated functional outcomes upon discharge from inpatient rehab: modified independent and supervision PT, modified independent and supervision OT,  modified independent and supervision SLP Estimated rehab length of stay to reach the above functional goals is: 7-10 days Anticipated discharge destination: Home 10. Overall Rehab/Functional Prognosis: excellent     MD Signature: Meredith Staggers, MD, [place] Director Rehabilitation Services 01/01/2022

## 2022-01-02 NOTE — Evaluation (Signed)
Speech Language Pathology Assessment and Plan  Patient Details  Name: Chad Santos MRN: [id number] Date of Birth: 1960-10-23  SLP Diagnosis: Dysarthria;Cognitive Impairments;Dysphagia  Rehab Potential: Good ELOS: 7-10 days    Today's Date: 01/02/2022 SLP Individual Time: 1017-5102 SLP Individual Time Calculation (min): 60 min   Hospital Problem: Principal Problem:   Acute ischemic stroke [facility])  Past Medical History:  Past Medical History:  Diagnosis Date   Hypertension    Past Surgical History:  Past Surgical History:  Procedure Laterality Date   BIOPSY  08/23/2021   Procedure: BIOPSY;  Surgeon: [name], MD;  Location: [place];  Service: Gastroenterology;;   COLONOSCOPY WITH PROPOFOL N/A 08/23/2021   Procedure: COLONOSCOPY WITH PROPOFOL;  Surgeon: [name], MD;  Location: [place];  Service: Gastroenterology;  Laterality: N/A;   ESOPHAGOGASTRODUODENOSCOPY (EGD) WITH PROPOFOL N/A 08/23/2021   Procedure: ESOPHAGOGASTRODUODENOSCOPY (EGD) WITH PROPOFOL;  Surgeon: [name], MD;  Location: [place];  Service: Gastroenterology;  Laterality: N/A;   HAND SURGERY Right    POLYPECTOMY  08/23/2021   Procedure: POLYPECTOMY;  Surgeon: [name], MD;  Location: [place];  Service: Gastroenterology;;    Assessment / Plan / Recommendation Clinical Impression  Chad Santos is a 61 year old right-handed male who presented to [facility] emergency department on the evening of 12/28/2021 with complaints of left-sided unilateral weakness that started at approximately 1 AM previous morning.  He initially called his family who noticed that his speech was slurred and EMS was called.  He fell sustaining a laceration to his left forearm but denied head trauma.  He was outside the window for tenecteplase and imaging revealed no LVO suspected.  Plavix was initiated on 6/8 and home aspirin held due to the patient's history of recent upper GI bleed.  MRI of the  brain showed small right internal capsule lacunar infarct.  He had noted improvement in his dysarthria.  2D echo revealed an ejection fraction of approximately 45 to 50% with mild global hypokinesis.  Left upper extremity and left lower extremity strength approximately 4/5.  The patient is tolerating dysphagia 2 with thin liquids.  Recommend Plavix and no aspirin due to history of PUD.  H&H stable.The patient requires inpatient physical medicine and rehabilitation evaluations and treatment secondary to dysfunction due to acute lacunar infarct.  Pt presents with moderate cognitive deficits, moderate dysarthria and mild dysphagia. Pt scored 13/30 on formal cognitive assessment SLUMS (n=>27) indicating impairments in memory, sustained attention, scanning left of midline, basic problem solving and emergent awareness. Pt demonstrated low vocal intensity,misarticulations and wet vocal quality impacted by reduced management of oral secretions. Pt demonstrates 50% intelligibility at the word/simple phrase level due to the above mentioned deficits. Pt demonstrates ability to sustain /ah/ for 3 seconds with no increase of volume on command. Oral motor examination indicated left side facial weakness. Pt consumed thin liquids via straw, dys 3 textures and regular texture trials. Pt demonstrated mild left pocketing on regular textures, increase oral stasis impacted by dry oral cavity and cues for small bites/liquid wash. SLP recommends dys 3 textures and thin liquids, medication whole with liquids, with full supervision to ensure small bites/sips and oral clearance. Pt supports increase lethargy and reduced sustained attention due to lack of sleep. Pt would benefit from skilled ST services in order to maximize functional independence and reduce burden of care, requiring 24 hour supervision at discharge with continued skilled ST services.    Skilled Therapeutic Interventions  Skilled ST services focused on cognitive  skills. SLP facilitated administration of cognitive linguistic formal assessment and provided education of results. SLP and pt collaborated to set goals for cognitive linguistic needs during length of stay. All questions answered to satisfaction. SLP facilitated basic problem solving and scanning left of midline in 3-4 step picture card of ADL events sequencing task. Pt demonstrated ability to sequence 3 step pictures mod I with mod A verbal cues to correct errors in 4 step picture card task. Pt demonstrated ability to scan left of midline mod I during table top task. Pt demonstrated ability to name nouns with 50% intelligibility and no ability to increase vocal intensity on command. Pt was left in room with call bell within reach and bed alarm set. SLP recommends to continue skilled services.    SLP Assessment  Patient will need skilled Speech Lanaguage Pathology Services during CIR admission    Recommendations  SLP Diet Recommendations: Dysphagia 3 (Mech soft);Thin Liquid Administration via: Cup;Straw Medication Administration: Whole meds with liquid Supervision: Full supervision/cueing for compensatory strategies Compensations: Slow rate;Small sips/bites;Lingual sweep for clearance of pocketing;Follow solids with liquid Postural Changes and/or Swallow Maneuvers: Seated upright 90 degrees Oral Care Recommendations: Oral care BID Patient destination: Home Follow up Recommendations: Home Health SLP;Outpatient SLP;24 hour supervision/assistance Equipment Recommended: None recommended by SLP    SLP Frequency 3 to 5 out of 7 days   SLP Duration  SLP Intensity  SLP Treatment/Interventions 7-10 days  Minumum of 1-2 x/day, 30 to 90 minutes  Cognitive remediation/compensation;Cueing hierarchy;Dysphagia/aspiration precaution training;Patient/family education;Internal/external aids;Functional tasks    Pain Pain Assessment Pain Score: 0-No pain  Prior Functioning Cognitive/Linguistic Baseline:  Information not available Type of Home: House  Lives With: Family Available Help at Discharge: Family;Available PRN/intermittently Vocation: On disability  SLP Evaluation Cognition Overall Cognitive Status: Impaired/Different from baseline Arousal/Alertness: Awake/alert Orientation Level: Oriented X4 Attention: Sustained Focused Attention: Appears intact Sustained Attention: Impaired Sustained Attention Impairment: Verbal complex;Functional complex Selective Attention: Impaired Selective Attention Impairment: Verbal basic;Functional basic Memory: Impaired Memory Impairment: Decreased recall of new information;Storage deficit Decreased Short Term Memory: Verbal basic;Functional basic Awareness: Impaired Awareness Impairment: Emergent impairment Problem Solving: Impaired Problem Solving Impairment: Functional basic;Verbal complex Self Monitoring: Impaired Self Monitoring Impairment: Functional basic Behaviors: Impulsive (mild) Safety/Judgment: Impaired  Comprehension Auditory Comprehension Overall Auditory Comprehension: Appears within functional limits for tasks assessed Expression Verbal Expression Overall Verbal Expression: Impaired Initiation: No impairment Naming: Impairment Confrontation: Within functional limits Divergent: 25-49% accurate Interfering Components: Attention Written Expression Dominant Hand: Right Written Expression: Within Functional Limits Oral Motor Oral Motor/Sensory Function Overall Oral Motor/Sensory Function: Mild impairment Facial ROM: Reduced left;Suspected CN VII (facial) dysfunction Facial Symmetry: Abnormal symmetry left;Suspected CN VII (facial) dysfunction Facial Strength: Reduced left;Suspected CN VII (facial) dysfunction Lingual ROM: Within Functional Limits Lingual Symmetry: Abnormal symmetry left Lingual Strength: Within Functional Limits Lingual Sensation: Within Functional Limits Velum: Within Functional Limits Mandible:  Within Functional Limits Motor Speech Overall Motor Speech: Impaired Respiration: Within functional limits Phonation: Low vocal intensity;Wet Resonance: Within functional limits Articulation: Impaired Level of Impairment: Phrase Word: 50-74% accurate Phrase: 50-74% accurate Sentence: 25-49% accurate Conversation: 25-49% accurate Motor Planning: Witnin functional limits Motor Speech Errors: Not applicable Effective Techniques: Slow rate;Over-articulate;Pause  Care Tool Care Tool Cognition Ability to hear (with hearing aid or hearing appliances if normally used Ability to hear (with hearing aid or hearing appliances if normally used): 0. Adequate - no difficulty in normal conservation, social interaction, listening to TV   Expression of Ideas and  Wants Expression of Ideas and Wants: 2. Frequent difficulty - frequently exhibits difficulty with expressing needs and ideas   Understanding Verbal and Non-Verbal Content Understanding Verbal and Non-Verbal Content: 3. Usually understands - understands most conversations, but misses some part/intent of message. Requires cues at times to understand  Memory/Recall Ability Memory/Recall Ability : Location of own room;Current season;Staff names and faces;That he or she is in a hospital/hospital unit   PMSV Assessment  PMSV Trial Word: 50-74% accurate Phrase: 50-74% accurate Sentence: 25-49% accurate Conversation: 25-49% accurate  Bedside Swallowing Assessment General Diet Prior to this Study: Dysphagia 2 (chopped);Thin liquids History of Recent Intubation: No Oral Cavity - Dentition: Missing dentition Self-Feeding Abilities: Able to feed self Vision: Functional for self-feeding Patient Positioning: Upright in bed Baseline Vocal Quality: Wet;Low vocal intensity Volitional Cough: Weak Volitional Swallow: Able to elicit  Oral Care Assessment Does patient have any of the following "high(er) risk" factors?: None of the above Does patient  have any of the following "at risk" factors?: None of the above Patient is AT RISK: Order set for Adult Oral Care Protocol initiated -  "At Risk Patients" option selected (see row information) Patient is LOW RISK: Follow universal precautions (see row information) Ice Chips Ice chips: Not tested Thin Liquid Thin Liquid: Within functional limits Presentation: Straw Nectar Thick Nectar Thick Liquid: Not tested Honey Thick Honey Thick Liquid: Not tested Puree Puree: Not tested Solid Solid: Impaired Presentation: Self Fed Oral Phase Impairments: Impaired mastication;Reduced labial seal;Reduced lingual movement/coordination Oral Phase Functional Implications: Left lateral sulci pocketing;Prolonged oral transit;Impaired mastication;Oral residue BSE Assessment Risk for Aspiration Impact on safety and function: Mild aspiration risk Other Related Risk Factors: Cognitive impairment  Short Term Goals: Week 1: SLP Short Term Goal 1 (Week 1): STG=LTG due to ELOS 7-10 days  Refer to Care Plan for Long Term Goals  Recommendations for other services: None   Discharge Criteria: Patient will be discharged from SLP if patient refuses treatment 3 consecutive times without medical reason, if treatment goals not met, if there is a change in medical status, if patient makes no progress towards goals or if patient is discharged from hospital.  The above assessment, treatment plan, treatment alternatives and goals were discussed and mutually agreed upon: by patient  Autumnrose Yore  [facility] 01/02/2022, 2:52 PM

## 2022-01-02 NOTE — Plan of Care (Signed)
  Problem: RH Swallowing Goal: LTG Patient will consume least restrictive diet using compensatory strategies with assistance (SLP) Description: LTG:  Patient will consume least restrictive diet using compensatory strategies with assistance (SLP) 01/02/2022 1444 by Charolett Bumpers, [place] (Taken 01/02/2022 1444) LTG: Pt Patient will consume least restrictive diet using compensatory strategies with assistance of (SLP): Supervision 01/02/2022 1441 by Charolett Bumpers, [place] (Taken 01/02/2022 1441) LTG: Pt Patient will consume least restrictive diet using compensatory strategies with assistance of (SLP): Minimal Assistance - Patient > 75% Goal: LTG Patient will participate in dysphagia therapy to increase swallow function with assistance (SLP) Description: LTG:  Patient will participate in dysphagia therapy to increase swallow function with assistance (SLP) 01/02/2022 1444 by Charolett Bumpers, [place] (Taken 01/02/2022 1444) LTG: Pt will participate in dysphagia therapy to increase swallow function with assistance of (SLP): Supervision 01/02/2022 1441 by Charolett Bumpers, [place] (Taken 01/02/2022 1441) LTG: Pt will participate in dysphagia therapy to increase swallow function with assistance of (SLP): Minimal Assistance - Patient > 75%

## 2022-01-02 NOTE — Progress Notes (Signed)
Therapist stated today that the patient is complaining of inability to sleep.  He is receiving trazodone 100 mg at 6 PM nightly.  We will adjust trazodone administration to bedtime and add melatonin 3 mg as well.

## 2022-01-02 NOTE — Evaluation (Addendum)
Occupational Therapy Assessment and Plan  Patient Details  Name: Yukio Bisping MRN: [id number] Date of Birth: [birth year]  OT Diagnosis: ataxia, cognitive deficits, disturbance of vision, and muscle weakness (generalized) Rehab Potential: Rehab Potential (ACUTE ONLY): Good ELOS: 7-10 days   Today's Date: 01/02/2022 OT Individual Time: 0800-0900 OT Individual Time Calculation (min): 60 min     Hospital Problem: Principal Problem:   Acute ischemic stroke [facility])   Past Medical History:  Past Medical History:  Diagnosis Date   Hypertension    Past Surgical History:  Past Surgical History:  Procedure Laterality Date   BIOPSY  08/23/2021   Procedure: BIOPSY;  Surgeon: [name], MD;  Location: [place];  Service: Gastroenterology;;   COLONOSCOPY WITH PROPOFOL N/A 08/23/2021   Procedure: COLONOSCOPY WITH PROPOFOL;  Surgeon: [name], MD;  Location: [place];  Service: Gastroenterology;  Laterality: N/A;   ESOPHAGOGASTRODUODENOSCOPY (EGD) WITH PROPOFOL N/A 08/23/2021   Procedure: ESOPHAGOGASTRODUODENOSCOPY (EGD) WITH PROPOFOL;  Surgeon: [name], MD;  Location: [place];  Service: Gastroenterology;  Laterality: N/A;   HAND SURGERY Right    POLYPECTOMY  08/23/2021   Procedure: POLYPECTOMY;  Surgeon: [name], MD;  Location: [facility] ENDOSCOPY;  Service: Gastroenterology;;    Assessment & Plan Clinical Impression: Korie Brabson is a 61 year old right-handed male who presented to [facility] emergency department on the evening of 12/28/2021 with complaints of left-sided unilateral weakness that started at approximately 1 AM previous morning.  He initially called his family who noticed that his speech was slurred and EMS was called.  He fell sustaining a laceration to his left forearm but denied head trauma.  He was outside the window for tenecteplase and imaging revealed no LVO suspected.  Plavix was initiated on 6/8 and home aspirin held due to the  patient's history of recent upper GI bleed.  MRI of the brain showed small right internal capsule lacunar infarct.  He had noted improvement in his dysarthria.  2D echo revealed an ejection fraction of approximately 45 to 50% with mild global hypokinesis.  Left upper extremity and left lower extremity strength approximately 4/5.  The patient is tolerating dysphagia 2 with thin liquids.  Recommend Plavix and no aspirin due to history of PUD.  H&H stable.The patient requires inpatient physical medicine and rehabilitation evaluations and treatment secondary to dysfunction due to acute lacunar infarct. Patient transferred to CIR on 01/01/2022 .    Patient currently requires min with basic self-care skills secondary to decreased visual perceptual skills and decreased visual motor skills, decreased attention to left and decreased motor planning, decreased awareness, decreased problem solving, decreased safety awareness, and decreased memory,  , and decreased standing balance and decreased balance strategies.  Prior to hospitalization, patient could complete ADLs and functional transfers with modified independent .  Patient will benefit from skilled intervention to decrease level of assist with basic self-care skills and increase independence with basic self-care skills prior to discharge home with care partner.  Anticipate patient will require 24 hour supervision and follow up home health.  OT - End of Session Activity Tolerance: Tolerates 30+ min activity without fatigue Endurance Deficit: No OT Assessment Rehab Potential (ACUTE ONLY): Good OT Barriers to Discharge: Home environment access/layout OT Patient demonstrates impairments in the following area(s): Balance;Vision;Perception;Cognition OT Basic ADL's Functional Problem(s): Bathing;Dressing;Toileting OT Transfers Functional Problem(s): Tub/Shower OT Additional Impairment(s): Fuctional Use of Upper Extremity OT Plan OT Intensity: Minimum of 1-2  x/day, 45 to 90 minutes OT Frequency: 5  out of 7 days OT Duration/Estimated Length of Stay: 7-10 days OT Treatment/Interventions: Balance/vestibular training;Functional mobility training;Therapeutic Activities;Visual/perceptual remediation/compensation;UE/LE Strength taining/ROM;UE/LE Coordination activities;Therapeutic Exercise;Self Care/advanced ADL retraining;Patient/family education;Neuromuscular re-education;Discharge planning OT Self Feeding Anticipated Outcome(s): Mod I OT Basic Self-Care Anticipated Outcome(s): Supervision-Mod I OT Toileting Anticipated Outcome(s): Supervision OT Bathroom Transfers Anticipated Outcome(s): Mod I OT Recommendation Patient destination: Home Follow Up Recommendations: Home health OT   OT Evaluation Precautions/Restrictions  Precautions Precautions: Fall Restrictions Weight Bearing Restrictions: No General Chart Reviewed: Yes Family/Caregiver Present: No No pain reported during session. Home Living/Prior Functioning Home Living Family/patient expects to be discharged to:: Private residence Living Arrangements: Other relatives Available Help at Discharge: Family, Available PRN/intermittently Type of Home: House Home Access: Stairs to enter Technical brewer of Steps: 4 Entrance Stairs-Rails: Right Home Layout: One level Bathroom Shower/Tub: Tub/shower unit (has tub transfer bench) Bathroom Toilet: Standard Additional Comments: Pt's sister reports another sister will be moving into pt's home soon  Lives With: Other (Comment) (sister) IADL History Occupation: On disability Leisure and Hobbies: enjoys basketball Prior Function Level of Independence: Independent with basic ADLs, Independent with transfers, Needs assistance with homemaking, Needs assistance with gait (uses RW) Vocation: On disability Vision Baseline Vision/History: 0 No visual deficits Ability to See in Adequate Light: 0 Adequate Vision Assessment?: Yes Eye Alignment:  Impaired (comment) Tracking/Visual Pursuits: Decreased smoothness of vertical tracking Convergence: Impaired (comment) Additional Comments: Pt with difficulty scanning, tracking, and visual perception Perception  Perception: Impaired Praxis Praxis: Impaired Praxis Impairment Details: Motor planning Cognition Cognition Overall Cognitive Status: Impaired/Different from baseline Arousal/Alertness: Awake/alert Orientation Level: Person;Place;Situation Person: Oriented Place: Oriented Situation: Oriented Memory: Impaired Memory Impairment: Decreased short term memory;Decreased recall of new information Decreased Short Term Memory: Verbal basic;Functional basic Attention: Selective;Focused Focused Attention: Appears intact Sustained Attention: Appears intact Selective Attention: Impaired Selective Attention Impairment: Verbal basic;Functional basic Awareness: Impaired Awareness Impairment: Emergent impairment;Anticipatory impairment Problem Solving: Impaired Problem Solving Impairment: Functional basic Self Monitoring: Impaired Self Monitoring Impairment: Functional basic Behaviors: Impulsive (mild) Safety/Judgment: Impaired Brief Interview for Mental Status (BIMS) Repetition of Three Words (First Attempt): 3 Temporal Orientation: Year: Correct Temporal Orientation: Month: Accurate within 5 days Temporal Orientation: Day: Correct Recall: "Sock": Yes, after cueing ("something to wear") Recall: "Blue": Yes, after cueing ("a color") Recall: "Bed": No, could not recall BIMS Summary Score: 11 Sensation Sensation Light Touch: Appears Intact Coordination Gross Motor Movements are Fluid and Coordinated: No Fine Motor Movements are Fluid and Coordinated: No Motor  Motor Motor: Hemiplegia Motor - Skilled Clinical Observations: L hemi  Trunk/Postural Assessment  Cervical Assessment Cervical Assessment: Exceptions to [facility] (forward head) Thoracic Assessment Thoracic Assessment:  Exceptions to [facility] (rounded shoulders) Lumbar Assessment Lumbar Assessment: Exceptions to [facility] (posterior pelvic tilt) Postural Control Postural Control: Deficits on evaluation Righting Reactions: mild delay  Balance Balance Balance Assessed: Yes Static Sitting Balance Static Sitting - Balance Support: Feet supported;No upper extremity supported Static Sitting - Level of Assistance: 5: Stand by assistance Dynamic Sitting Balance Dynamic Sitting - Balance Support: Feet supported;No upper extremity supported;During functional activity Dynamic Sitting - Level of Assistance: 4: Stand by assistance Static Standing Balance Static Standing - Balance Support: Bilateral upper extremity supported Static Standing - Level of Assistance: 5: Supervision Dynamic Standing Balance Dynamic Standing - Balance Support: During functional activity;Bilateral upper extremity supported Dynamic Standing - Level of Assistance: 4: Min assist Extremity/Trunk Assessment RUE Assessment RUE Assessment: Within Functional Limits General Strength Comments: 4/5 LUE Assessment LUE Assessment: Exceptions to [facility] General Strength Comments: 3/5  Care Tool  Care Tool Self Care Eating   Eating Assist Level: Supervision/Verbal cueing    Oral Care    Oral Care Assist Level: Contact Guard/Toucning assist    Bathing   Body parts bathed by patient: Right arm;Left arm;Chest;Abdomen;Front perineal area;Buttocks;Right upper leg;Left upper leg;Right lower leg;Left lower leg;Face     Assist Level: Contact Guard/Touching assist    Upper Body Dressing(including orthotics)   What is the patient wearing?: Hospital gown only   Assist Level: Moderate Assistance - Patient 50 - 74%    Lower Body Dressing (excluding footwear)   What is the patient wearing?: [place] only;Incontinence brief Assist for lower body dressing: Maximal Assistance - Patient 25 - 49%    Putting on/Taking off footwear   What is the patient wearing?:  Non-skid slipper socks Assist for footwear: Total Assistance - Patient < 25%       Care Tool Toileting Toileting activity   Assist for toileting: Moderate Assistance - Patient 50 - 74%     Care Tool Bed Mobility Roll left and right activity   Roll left and right assist level: Moderate Assistance - Patient 50 - 74%    Sit to lying activity   Sit to lying assist level: Moderate Assistance - Patient 50 - 74%    Lying to sitting on side of bed activity   Lying to sitting on side of bed assist level: the ability to move from lying on the back to sitting on the side of the bed with no back support.: Moderate Assistance - Patient 50 - 74%     Care Tool Transfers Sit to stand transfer   Sit to stand assist level: Moderate Assistance - Patient 50 - 74%    Chair/bed transfer   Chair/bed transfer assist level: Moderate Assistance - Patient 50 - 74%     Toilet transfer   Assist Level: Moderate Assistance - Patient 50 - 74%     Care Tool Cognition  Expression of Ideas and Wants Expression of Ideas and Wants: 4. Without difficulty (complex and basic) - expresses complex messages without difficulty and with speech that is clear and easy to understand  Understanding Verbal and Non-Verbal Content Understanding Verbal and Non-Verbal Content: 4. Understands (complex and basic) - clear comprehension without cues or repetitions   Memory/Recall Ability Memory/Recall Ability : Current season   Refer to Care Plan for Long Term Goals  SHORT TERM GOAL WEEK 1 OT Short Term Goal 1 (Week 1): Pt will be Supervision for UB/LB ADLs OT Short Term Goal 2 (Week 1): Pt will be Supervion for functional transfers OT Short Term Goal 3 (Week 1): Pt will demonstrate improved attention to the L side during ADLs OT Short Term Goal 4 (Week 1): Pt will demonstrate improved strength in the L UE to 4+/5  Recommendations for other services: None    Skilled Therapeutic Intervention ADL OT evaluation initiated, role  of OT explained, POC, and discharge recommendations provided. Pt completes ADL tasks at the levels listed above and demonstrates decreased balance, visual perception, and weakness limiting his independence. Pt currently functioning at a CGA-Supervision level which is below his baseline.   Mobility  Bed Mobility Bed Mobility: Sit to Supine;Supine to Sit Supine to Sit: Supervision/Verbal cueing Sit to Supine: Supervision/Verbal cueing Transfers Sit to Stand: Minimal Assistance - Patient > 75% Stand to Sit: Minimal Assistance - Patient > 75%   Discharge Criteria: Patient will be discharged from OT if patient refuses treatment 3 consecutive times without medical reason,  if treatment goals not met, if there is a change in medical status, if patient makes no progress towards goals or if patient is discharged from hospital.  The above assessment, treatment plan, treatment alternatives and goals were discussed and mutually agreed upon: by patient  Marvetta Gibbons 01/02/2022, 8:29 AM

## 2022-01-02 NOTE — Plan of Care (Signed)
  Problem: RH Swallowing Goal: LTG Patient will consume least restrictive diet using compensatory strategies with assistance (SLP) Description: LTG:  Patient will consume least restrictive diet using compensatory strategies with assistance (SLP) Flowsheets (Taken 01/02/2022 1441) LTG: Pt Patient will consume least restrictive diet using compensatory strategies with assistance of (SLP): Minimal Assistance - Patient > 75% Goal: LTG Patient will participate in dysphagia therapy to increase swallow function with assistance (SLP) Description: LTG:  Patient will participate in dysphagia therapy to increase swallow function with assistance (SLP) Flowsheets (Taken 01/02/2022 1441) LTG: Pt will participate in dysphagia therapy to increase swallow function with assistance of (SLP): Minimal Assistance - Patient > 75%   Problem: RH Expression Communication Goal: LTG Patient will increase speech intelligibility (SLP) Description: LTG: Patient will increase speech intelligibility at word/phrase/conversation level with cues, % of the time (SLP) Flowsheets (Taken 01/02/2022 1441) LTG: Patient will increase speech intelligibility (SLP): Moderate Assistance - Patient 50 - 74% Level: Phrase Percent of time patient will use intelligible speech: 60%   Problem: RH Problem Solving Goal: LTG Patient will demonstrate problem solving for (SLP) Description: LTG:  Patient will demonstrate problem solving for basic/complex daily situations with cues  (SLP) Flowsheets (Taken 01/02/2022 1441) LTG: Patient will demonstrate problem solving for (SLP): (mildly complex)  Basic daily situations  Other (comment) LTG Patient will demonstrate problem solving for: Minimal Assistance - Patient > 75%   Problem: RH Memory Goal: LTG Patient will use memory compensatory aids to (SLP) Description: LTG:  Patient will use memory compensatory aids to recall biographical/new, daily complex information with cues (SLP) Flowsheets (Taken  01/02/2022 1441) LTG: Patient will use memory compensatory aids to (SLP): Minimal Assistance - Patient > 75%   Problem: RH Attention Goal: LTG Patient will demonstrate this level of attention during functional activites (SLP) Description: LTG:  Patient will will demonstrate this level of attention during functional activites (SLP) Flowsheets (Taken 01/02/2022 1441) Patient will demonstrate during cognitive/linguistic activities the attention type of: Sustained Patient will demonstrate this level of attention during cognitive/linguistic activities in: Controlled LTG: Patient will demonstrate this level of attention during cognitive/linguistic activities with assistance of (SLP): Minimal Assistance - Patient > 75% Number of minutes patient will demonstrate attention during cognitive/linguistic activities: 5-10   Problem: RH Awareness Goal: LTG: Patient will demonstrate awareness during functional activites type of (SLP) Description: LTG: Patient will demonstrate awareness during functional activites type of (SLP) Flowsheets (Taken 01/02/2022 1441) Patient will demonstrate during cognitive/linguistic activities awareness type of: Emergent LTG: Patient will demonstrate awareness during cognitive/linguistic activities with assistance of (SLP): Minimal Assistance - Patient > 75%

## 2022-01-02 NOTE — Discharge Summary (Signed)
Physician Discharge Summary  [name] MRN: [id number] DOB/AGE: September 16, 1960 61 y.o.  Admit date: 01/01/2022 Discharge date: 01/10/2022  Discharge Diagnoses:  Principal Problem:   Acute ischemic stroke [facility]) Active problems: Functional deficits secondary to acute ischemic stroke Hypertension Hyperlipidemia Tobacco use Alcohol use THC use Obesity Peptic ulcer disease Elevated creatinine  Discharged Condition: good  Labs:  Basic Metabolic Panel: Recent Labs  Lab 01/04/22 0511 01/09/22 0552  NA 139 139  K 4.1 4.0  CL 104 106  CO2 26 26  GLUCOSE 91 96  BUN 20 20  CREATININE 1.29* 1.31*  CALCIUM 9.0 9.3    CBC: Recent Labs  Lab 01/09/22 0552  WBC 7.1  HGB 12.6*  HCT 37.6*  MCV 88.3  PLT 255    CBG: No results for input(s): "GLUCAP" in the last 168 hours.  Brief HPI:   Chad Santos is a 61 y.o. male who presented to the emergency department complaining of left-sided weakness and slurred speech.  No evidence of hemorrhagic stroke.  Plavix initiated and aspirin held due to patient's history of peptic ulcer disease and recent upper GI bleed.  MRI of the brain showed small right internal capsule lacunar infarct.   Hospital Course: Chad Santos was admitted to rehab 01/01/2022 for inpatient therapies to consist of PT, ST and OT at least three hours five days a week. Past admission physiatrist, therapy team and rehab RN have worked together to provide customized collaborative inpatient rehab. Reported poor sleep and trazodone changed to administer at bedtime instead of at suppertime and melatonin 3 mg added. Upgraded to dysphagia 2 diet on 6/15. Serum creatinine followed and remained mildly elevated, around 1.3, consistent with CKD. Slight drift in hemoglobin to 12.6 without signs of GI bleeding. Iron studies normal except TIBC of 195 on 08/22/2021. Recommend follow-up labs as outpatient. Patient has follow-up with GI on 01/20/2022.   Blood pressures  were monitored on TID basis and started on Norvasc 2.5 mg on admission. The dose was increased to 5 mg daily on 6/17.   Rehab course: During patient's stay in rehab weekly team conferences were held to monitor patient's progress, set goals and discuss barriers to discharge. At admission, patient required min assist with mobility and CGA and min assist with ADLs.  He has had improvement in activity tolerance, balance, postural control as well as ability to compensate for deficits. He has had improvement in functional use RUE/LUE  and RLE/LLE as well as improvement in awareness Supine to sit and sit to stand with mod I at RW and ambulated to bathroom with supervision.  Toilet transfer and toileting completed with supervision. Continent of urine. Sit to stand and transfers with RW and Supervision throughout session. Car transfer with RW and CGA for some balance.   Disposition: Home Discharge disposition: 01-Home or Self Care     Diet: Dysphagia 2 diet  Special Instructions:  No driving, alcohol consumption or tobacco use.   30-35 minutes were spent on discharge planning and discharge summary.  Discharge Instructions     Ambulatory referral to Physical Medicine Rehab   Complete by: As directed    Hospital follow-up with Dr. Curlene Dolphin in 2 weeks (or with Dr. Ranell Patrick)   Discharge patient   Complete by: As directed    Discharge disposition: 01-Home or Self Care   Discharge patient date: 01/09/2022      Allergies as of 01/09/2022   No Known Allergies      Medication List  TAKE these medications    acetaminophen 325 MG tablet Commonly known as: TYLENOL Take 1-2 tablets (325-650 mg total) by mouth every 4 (four) hours as needed for mild pain.   ALPRAZolam 0.5 MG tablet Commonly known as: XANAX Take 1 tablet (0.5 mg total) by mouth 2 (two) times daily as needed for anxiety (for agitation).   amLODipine 5 MG tablet Commonly known as: NORVASC Take 1 tablet (5 mg total) by  mouth daily. Start taking on: January 10, 2022 What changed:  medication strength how much to take   atorvastatin 80 MG tablet Commonly known as: LIPITOR Take 1 tablet (80 mg total) by mouth at bedtime.   bacitracin ointment Apply 1 Application topically 2 (two) times daily.   clopidogrel 75 MG tablet Commonly known as: PLAVIX Take 1 tablet (75 mg total) by mouth daily.   methocarbamol 500 MG tablet Commonly known as: ROBAXIN Take 1 tablet (500 mg total) by mouth every 6 (six) hours as needed for muscle spasms.   nicotine 21 mg/24hr patch Commonly known as: NICODERM CQ - dosed in mg/24 hours Place 1 patch (21 mg total) onto the skin daily. Start taking on: January 10, 2022   pantoprazole 40 MG tablet Commonly known as: PROTONIX Take 1 tablet (40 mg total) by mouth 2 (two) times daily.   traZODone 150 MG tablet Commonly known as: DESYREL Take 1 tablet (150 mg total) by mouth at bedtime as needed for sleep. What changed:  medication strength how much to take when to take this reasons to take this        Follow-up Information     [facility] Follow up.   Why: Call tomorrow to make arrangements for hospital follow-up appointment Contact information: [place] [id number] [phone]        Jennye Boroughs, MD Follow up.   Specialty: Physical Medicine and Rehabilitation Why: office will call you to arrange your appt (sent) Contact information: [address] [place] [address] [phone]                 Signed: Barbie Banner 01/09/2022, 9:04 AM

## 2022-01-02 NOTE — Progress Notes (Signed)
Inpatient Rehabilitation  Patient information reviewed and entered into eRehab system by Makenzy Krist M. Tobi Groesbeck, M.A., CCC/SLP, PPS Coordinator.  Information including medical coding, functional ability and quality indicators will be reviewed and updated through discharge.    

## 2022-01-02 NOTE — Progress Notes (Signed)
Inpatient Rehabilitation Center Individual Statement of Services  Patient Name:  Chad Santos  Date:  01/02/2022  Welcome to the [facility].  Our goal is to provide you with an individualized program based on your diagnosis and situation, designed to meet your specific needs.  With this comprehensive rehabilitation program, you will be expected to participate in at least 3 hours of rehabilitation therapies Monday-Friday, with modified therapy programming on the weekends.  Your rehabilitation program will include the following services:  Physical Therapy (PT), Occupational Therapy (OT), Speech Therapy (ST), 24 hour per day rehabilitation nursing, Therapeutic Recreaction (TR), Neuropsychology, Care Coordinator, Rehabilitation Medicine, Nutrition Services, Pharmacy Services, and Other  Weekly team conferences will be held on Wednesdays to discuss your progress.  Your Inpatient Rehabilitation Care Coordinator will talk with you frequently to get your input and to update you on team discussions.  Team conferences with you and your family in attendance may also be held.  Expected length of stay: 7-10 Days  Overall anticipated outcome:  MOD I with AD or Supervision  Depending on your progress and recovery, your program may change. Your Inpatient Rehabilitation Care Coordinator will coordinate services and will keep you informed of any changes. Your Inpatient Rehabilitation Care Coordinator's name and contact numbers are listed  below.  The following services may also be recommended but are not provided by the [facility]:   Ray will be made to provide these services after discharge if needed.  Arrangements include referral to agencies that provide these services.  Your insurance has been verified to be:  MEDICAID  Your primary doctor is:  NO PCP  Pertinent information  will be shared with your doctor and your insurance company.  Inpatient Rehabilitation Care Coordinator:  Erlene Quan, [place] or [phone]  Information discussed with and copy given to patient by: Dyanne Iha, 01/02/2022, 12:09 PM

## 2022-01-02 NOTE — Progress Notes (Addendum)
PROGRESS NOTE   Subjective/Complaints: Pt reports constipation. No Additional concerns. Working with ST  Review of Systems  Constitutional:  Negative for chills and fever.  HENT:  Positive for congestion.   Cardiovascular:  Negative for chest pain.  Gastrointestinal:  Positive for constipation. Negative for abdominal pain, diarrhea, nausea and vomiting.      Objective:   No results found. Recent Labs    12/31/21 0247 01/02/22 0532  WBC 6.3 5.3  HGB 14.3 14.1  HCT 43.4 42.3  PLT 230 232   Recent Labs    01/02/22 0532  NA 137  K 4.2  CL 104  CO2 24  GLUCOSE 93  BUN 23  CREATININE 1.54*  CALCIUM 8.8*   No intake or output data in the 24 hours ending 01/02/22 0749      Physical Exam: Vital Signs Blood pressure (!) 160/98, pulse (!) 103, temperature 98.3 F (36.8 C), temperature source Oral, resp. rate 18, height '6\' 1"'$  (1.854 m), weight 72.6 kg, SpO2 96 %.  Physical Exam Constitutional:      General: He is not in acute distress.    Appearance: Normal appearance. He is not ill-appearing.  HENT:     Head: Normocephalic and atraumatic.     Right Ear: External ear normal.     Left Ear: External ear normal.     Nose: Nose normal.  Eyes:     Pupils: Pupils are equal, round, and reactive to light.  Cardiovascular:     Rate and Rhythm: Normal rate and regular rhythm.  Pulmonary:     Effort: Pulmonary effort is normal.     Breath sounds: CTAB Abdominal:     General: Bowel sounds are normal.     Palpations: Abdomen is soft.     Tenderness: There is no abdominal tenderness.  Musculoskeletal:        General: No swelling.     Comments: Bilateral hallux valgus  Skin:    General: Skin is warm and dry.  Neurological:     Mental Status: He is alert and oriented to person, place, and time.     Comments: Oriented. Fair insight and awareness. Able to provide some biographical information. Severe dysarthria.  Left VII and X11 deficits, did not seem to be able to visually track to left or much past midline for that matter. Motor 5/5 RUE and RLE and 4+/5 LUE and LLE. No focal sensory findings.   Psychiatric:     Comments: Pleasant         Assessment/Plan: 1. Functional deficits which require 3+ hours per day of interdisciplinary therapy in a comprehensive inpatient rehab setting. Physiatrist is providing close team supervision and 24 hour management of active medical problems listed below. Physiatrist and rehab team continue to assess barriers to discharge/monitor patient progress toward functional and medical goals  Care Tool:  Bathing              Bathing assist       Upper Body Dressing/Undressing Upper body dressing   What is the patient wearing?: Hospital gown only    Upper body assist Assist Level: Moderate Assistance - Patient 50 - 74%  Lower Body Dressing/Undressing Lower body dressing      What is the patient wearing?: Hospital gown only, Incontinence brief     Lower body assist Assist for lower body dressing: Maximal Assistance - Patient 25 - 49%     Toileting Toileting    Toileting assist Assist for toileting: Moderate Assistance - Patient 50 - 74%     Transfers Chair/bed transfer  Transfers assist     Chair/bed transfer assist level: Moderate Assistance - Patient 50 - 74%     Locomotion Ambulation   Ambulation assist              Walk 10 feet activity   Assist           Walk 50 feet activity   Assist           Walk 150 feet activity   Assist           Walk 10 feet on uneven surface  activity   Assist           Wheelchair     Assist               Wheelchair 50 feet with 2 turns activity    Assist            Wheelchair 150 feet activity     Assist          Blood pressure (!) 160/98, pulse (!) 103, temperature 98.3 F (36.8 C), temperature source Oral, resp. rate 18, height  '6\' 1"'$  (1.854 m), weight 72.6 kg, SpO2 96 %.  Medical Problem List and Plan: 1. 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             -patient may shower             -ELOS/Goals: 7-10 days, mod I to supervision with PT, OT, SLP  -PT/OT/SLP evaluations  2.  Antithrombotics: -DVT/anticoagulation:  Pharmaceutical: Lovenox             -antiplatelet therapy: Plavix 75 mg monotherapy 3. Pain Management: Tylenol as needed 4. Mood/Sleep: LCSW to evaluate and provide emotional support             -antipsychotic agents: n/a 5. Neuropsych/cognition: This patient is not quite capable of making decisions on his own behalf.             -use telesitter for safety 6. Skin/Wound Care: Routine skin care checks 7. Fluids/Electrolytes/Nutrition: routine Is and Os and recheck chemistries 8: Hypertension: ? home meds losartan, potassium supplement -"permissive HTN" post-CVA -norvasc 2.'5mg'$  initiated -6/12 BP a little elevated, monitor response to norvasc 9: Hyperlipidemia: allow permissive hypertension; home losartan held as of 6/9 10: Tobacco use: cessation counseling; continue nicotine patch 11: Alcohol use: cessation counseling 12: THC use: advised of stroke risk 13: Obesity: dietary consultation 14: H. pylori associated peptic ulcer disease: status post repeat EGD 12/09/2021 with no bleeding.  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 Constipation   -Multiple Bms have been recorded over past few days in chart, will start PRN miralax  LOS: 1 days A FACE TO FACE EVALUATION WAS PERFORMED  Jennye Boroughs 01/02/2022, 7:49 AM

## 2022-01-02 NOTE — Evaluation (Signed)
Physical Therapy Assessment and Plan  Patient Details  Name: Chad Santos MRN: [id number] Date of Birth: [birth year]  PT Diagnosis: Abnormal posture, Abnormality of gait, Difficulty walking, Hemiparesis non-dominant, Impaired cognition, and Muscle weakness Rehab Potential: Good ELOS: 7-9 days   Today's Date: 01/02/2022 PT Individual Time: 3491-7915 PT Individual Time Calculation (min): 72 min    Hospital Problem: Principal Problem:   Acute ischemic stroke [facility])   Past Medical History:  Past Medical History:  Diagnosis Date   Hypertension    Past Surgical History:  Past Surgical History:  Procedure Laterality Date   BIOPSY  08/23/2021   Procedure: BIOPSY;  Surgeon: [name], MD;  Location: [place];  Service: Gastroenterology;;   COLONOSCOPY WITH PROPOFOL N/A 08/23/2021   Procedure: COLONOSCOPY WITH PROPOFOL;  Surgeon: [name], MD;  Location: [place];  Service: Gastroenterology;  Laterality: N/A;   ESOPHAGOGASTRODUODENOSCOPY (EGD) WITH PROPOFOL N/A 08/23/2021   Procedure: ESOPHAGOGASTRODUODENOSCOPY (EGD) WITH PROPOFOL;  Surgeon: [name], MD;  Location: [place];  Service: Gastroenterology;  Laterality: N/A;   HAND SURGERY Right    POLYPECTOMY  08/23/2021   Procedure: POLYPECTOMY;  Surgeon: [name], MD;  Location: [facility] ENDOSCOPY;  Service: Gastroenterology;;    Assessment & Plan Clinical Impression: Patient is a 61 year old right-handed male who presented to [facility] emergency department on the evening of 12/28/2021 with complaints of left-sided unilateral weakness that started at approximately 1 AM previous morning.  He initially called his family who noticed that his speech was slurred and EMS was called.  He fell sustaining a laceration to his left forearm but denied head trauma.  He was outside the window for tenecteplase and imaging revealed no LVO suspected.  Plavix was initiated on 6/8 and home aspirin held due to the patient's  history of recent upper GI bleed.  MRI of the brain showed small right internal capsule lacunar infarct.  He had noted improvement in his dysarthria.  2D echo revealed an ejection fraction of approximately 45 to 50% with mild global hypokinesis.  Left upper extremity and left lower extremity strength approximately 4/5.  The patient is tolerating dysphagia 2 with thin liquids.  Recommend Plavix and no aspirin due to history of PUD.  H&H stable.The patient requires inpatient physical medicine and rehabilitation evaluations and treatment secondary to dysfunction due to acute lacunar infarct. Patient transferred to CIR on 01/01/2022 .   Patient currently requires min with mobility secondary to muscle weakness, decreased cardiorespiratoy endurance, motor apraxia, field cut, decreased attention, decreased awareness, decreased problem solving, decreased safety awareness, and decreased memory, and decreased sitting balance, decreased standing balance, decreased postural control, hemiplegia, and decreased balance strategies.  Prior to hospitalization, patient was modified independent  with mobility and lived with Family (sister) in a House home.  Home access is 4Stairs to enter.  Patient will benefit from skilled PT intervention to maximize safe functional mobility, minimize fall risk, and decrease caregiver burden for planned discharge home with 24 hour supervision.  Anticipate patient will benefit from follow up [place] at discharge.  PT - End of Session Activity Tolerance: Tolerates 10 - 20 min activity with multiple rests Endurance Deficit: Yes Endurance Deficit Description: seated rest breaks b/w functional mobility tasks PT Assessment Rehab Potential (ACUTE/IP ONLY): Good PT Barriers to Discharge: Decreased caregiver support;Lack of/limited family support;Home environment access/layout;Insurance for SNF coverage PT Patient demonstrates impairments in the following area(s): Balance;Endurance;Motor;Safety PT  Transfers Functional Problem(s): Bed Mobility;Bed to Chair;Car PT Locomotion Functional  Problem(s): Ambulation;Stairs PT Plan PT Intensity: Minimum of 1-2 x/day ,45 to 90 minutes PT Frequency: 5 out of 7 days PT Duration Estimated Length of Stay: 7-9 days PT Treatment/Interventions: Ambulation/gait training;Discharge planning;Functional mobility training;Psychosocial support;Therapeutic Activities;Visual/perceptual remediation/compensation;Therapeutic Exercise;Skin care/wound management;Neuromuscular re-education;Balance/vestibular training;Disease management/prevention;UE/LE Strength taining/ROM;Wheelchair propulsion/positioning;Pain management;DME/adaptive equipment instruction;Cognitive remediation/compensation;Splinting/orthotics;UE/LE Museum/gallery exhibitions officer reintegration PT Transfers Anticipated Outcome(s): supervision PT Locomotion Anticipated Outcome(s): supervision PT Recommendation Recommendations for Other Services: Neuropsych consult Follow Up Recommendations: Home health PT;24 hour supervision/assistance Patient destination: Home Equipment Recommended: To be determined   PT Evaluation Precautions/Restrictions Precautions Precautions: Fall Precaution Comments: Mild L hemi. R gaze with L visual field cut Restrictions Weight Bearing Restrictions: No Pain Pain Assessment Pain Scale: 0-10 Pain Score: 0-No pain Pain Interference Pain Interference Pain Effect on Sleep: 0. Does not apply - I have not had any pain or hurting in the past 5 days Pain Interference with Therapy Activities: 0. Does not apply - I have not received rehabilitationtherapy in the past 5 days Pain Interference with Day-to-Day Activities: 1. Rarely or not at all Home Living/Prior [place] Available Help at Discharge: Family;Available PRN/intermittently (sister works 8am-430pm) Type of Home: House Home  Access: Stairs to enter Technical brewer of Steps: 4 Entrance Stairs-Rails: Can reach both;Right;Left Home Layout: One level Bathroom Shower/Tub: Tub/shower unit (has tub bench) Bathroom Toilet: Standard  Lives With: Family (sister) Prior Function Level of Independence: Independent with gait;Independent with basic ADLs;Independent with homemaking with ambulation;Independent with transfers  Able to Take Stairs?: Yes Driving: No (sister drives) Vocation: On disability Vision/Perception  Vision - History Ability to See in Adequate Light: 0 Adequate Vision - Assessment Eye Alignment: Impaired (comment) Ocular Range of Motion: Restricted on the left;Impaired-to be further tested in functional context Tracking/Visual Pursuits: Decreased smoothness of vertical tracking;Decreased smoothness of horizontal tracking Saccades: Impaired - to be further tested in functional context Convergence: Impaired (comment) Additional Comments: Pt with difficulty scanning, tracking, and visual perception Perception Perception: Impaired Inattention/Neglect: Does not attend to left visual field;Does not attend to left side of body Praxis Praxis: Impaired Praxis Impairment Details: Motor planning  Cognition Overall Cognitive Status: Impaired/Different from baseline Arousal/Alertness: Awake/alert Orientation Level: Oriented to person;Oriented to place;Oriented to situation;Oriented to time Attention: Selective;Focused Focused Attention: Appears intact Sustained Attention: Appears intact Selective Attention: Impaired Selective Attention Impairment: Verbal basic;Functional basic Memory: Impaired Memory Impairment: Decreased short term memory;Decreased recall of new information Decreased Short Term Memory: Verbal basic;Functional basic Awareness: Impaired Awareness Impairment: Emergent impairment;Anticipatory impairment Problem Solving: Impaired Problem Solving Impairment: Functional basic Self  Monitoring: Impaired Self Monitoring Impairment: Functional basic Behaviors: Impulsive (mild) Safety/Judgment: Impaired Sensation Sensation Light Touch: Appears Intact Hot/Cold: Appears Intact Proprioception: Impaired by gross assessment Stereognosis: Not tested Coordination Gross Motor Movements are Fluid and Coordinated: No Fine Motor Movements are Fluid and Coordinated: No Coordination and Movement Description: L hemi, L visual field cut impacting mobility and movement patterns Heel Shin Test: WFL, slowed on L Motor  Motor Motor: Hemiplegia Motor - Skilled Clinical Observations: L hemi   Trunk/Postural Assessment  Cervical Assessment Cervical Assessment: Exceptions to [facility] (forward head) Thoracic Assessment Thoracic Assessment: Exceptions to [facility] (rounded shoulders) Lumbar Assessment Lumbar Assessment: Exceptions to [facility] (posterior pelvic tilt) Postural Control Postural Control: Deficits on evaluation Righting Reactions: mild delay  Balance Balance Balance Assessed: Yes Standardized Balance Assessment Standardized Balance Assessment: Timed Up and Go Test Timed Up and Go Test TUG: Normal TUG Normal TUG (seconds): 45 (with RW) Static Sitting Balance Static Sitting - Balance  Support: Feet supported;No upper extremity supported Static Sitting - Level of Assistance: 5: Stand by assistance Dynamic Sitting Balance Dynamic Sitting - Balance Support: Feet supported;No upper extremity supported;During functional activity Dynamic Sitting - Level of Assistance: 4: Min Insurance risk surveyor Standing - Balance Support: Bilateral upper extremity supported Static Standing - Level of Assistance: 5: Stand by assistance Dynamic Standing Balance Dynamic Standing - Balance Support: During functional activity;Bilateral upper extremity supported Dynamic Standing - Level of Assistance: 4: Min assist Extremity Assessment  RUE Assessment RUE Assessment: Within Functional  Limits General Strength Comments: 4/5 LUE Assessment LUE Assessment: Exceptions to [facility] General Strength Comments: 3/5 RLE Assessment RLE Assessment: Within Functional Limits LLE Assessment LLE Assessment: Exceptions to [facility] General Strength Comments: Grossly 4/5  Care Tool Care Tool Bed Mobility Roll left and right activity   Roll left and right assist level: Supervision/Verbal cueing    Sit to lying activity   Sit to lying assist level: Supervision/Verbal cueing    Lying to sitting on side of bed activity   Lying to sitting on side of bed assist level: the ability to move from lying on the back to sitting on the side of the bed with no back support.: Supervision/Verbal cueing     Care Tool Transfers Sit to stand transfer   Sit to stand assist level: Minimal Assistance - Patient > 75%    Chair/bed transfer   Chair/bed transfer assist level: Minimal Assistance - Patient > 75%     Toilet transfer   Assist Level: Contact Guard/Touching assist    Car transfer   Car transfer assist level: Minimal Assistance - Patient > 75%      Care Tool Locomotion Ambulation   Assist level: Minimal Assistance - Patient > 75% Assistive device: Walker-rolling Max distance: 240f  Walk 10 feet activity   Assist level: Minimal Assistance - Patient > 75% Assistive device: Walker-rolling   Walk 50 feet with 2 turns activity   Assist level: Minimal Assistance - Patient > 75% Assistive device: Walker-rolling  Walk 150 feet activity   Assist level: Minimal Assistance - Patient > 75% Assistive device: Walker-rolling  Walk 10 feet on uneven surfaces activity   Assist level: Minimal Assistance - Patient > 75% Assistive device: Walker-rolling  Stairs   Assist level: Minimal Assistance - Patient > 75% Stairs assistive device: 2 hand rails Max number of stairs: 12  Walk up/down 1 step activity   Walk up/down 1 step (curb) assist level: Minimal Assistance - Patient > 75% Walk up/down 1 step or  curb assistive device: 2 hand rails  Walk up/down 4 steps activity   Walk up/down 4 steps assist level: Minimal Assistance - Patient > 75% Walk up/down 4 steps assistive device: 2 hand rails  Walk up/down 12 steps activity   Walk up/down 12 steps assist level: Minimal Assistance - Patient > 75% Walk up/down 12 steps assistive device: 2 hand rails  Pick up small objects from floor Pick up small object from the floor (from standing position) activity did not occur: Safety/medical concerns      Wheelchair Is the patient using a wheelchair?: No          Wheel 50 feet with 2 turns activity      Wheel 150 feet activity        Refer to Care Plan for Long Term Goals  SHORT TERM GOAL WEEK 1 PT Short Term Goal 1 (Week 1): STG = LTG due to ELOS  Recommendations for  other services: Neuropsych  Skilled Therapeutic Intervention Mobility Bed Mobility Bed Mobility: Sit to Supine;Supine to Sit Supine to Sit: Supervision/Verbal cueing Sit to Supine: Supervision/Verbal cueing Transfers Transfers: Sit to Stand;Stand Pivot Transfers;Stand to Sit Sit to Stand: Minimal Assistance - Patient > 75% Stand to Sit: Minimal Assistance - Patient > 75% Stand Pivot Transfers: Minimal Assistance - Patient > 75% Stand Pivot Transfer Details: Verbal cues for technique;Verbal cues for precautions/safety;Verbal cues for safe use of DME/AE;Verbal cues for sequencing;Tactile cues for weight shifting Transfer (Assistive device): Rolling walker Locomotion  Gait Ambulation: Yes Gait Assistance: Minimal Assistance - Patient > 75% Gait Distance (Feet): 175 Feet Assistive device: Rolling walker Gait Assistance Details: Verbal cues for safe use of DME/AE;Verbal cues for precautions/safety;Verbal cues for gait pattern;Tactile cues for weight shifting Gait Gait: Yes Gait Pattern: Impaired Gait Pattern: Step-through pattern;Poor foot clearance - left;Poor foot clearance - right;Wide base of support;Decreased stride  length Gait velocity: Decreased Stairs / Additional Locomotion Stairs: Yes Stairs Assistance: Minimal Assistance - Patient > 75% Stair Management Technique: Two rails Number of Stairs: 12 Height of Stairs: 6 Wheelchair Mobility Wheelchair Mobility: No   Discharge Criteria: Patient will be discharged from PT if patient refuses treatment 3 consecutive times without medical reason, if treatment goals not met, if there is a change in medical status, if patient makes no progress towards goals or if patient is discharged from hospital.  The above assessment, treatment plan, treatment alternatives and goals were discussed and mutually agreed upon: by patient  [place] PT 01/02/2022, 12:09 PM

## 2022-01-02 NOTE — Progress Notes (Signed)
Inpatient Rehabilitation Care Coordinator Assessment and Plan Patient Details  Name: Chad Santos MRN: [id number] Date of Birth: 1961-06-23  Today's Date: 01/02/2022  Hospital Problems: Principal Problem:   Acute ischemic stroke [facility])  Past Medical History:  Past Medical History:  Diagnosis Date   Hypertension    Past Surgical History:  Past Surgical History:  Procedure Laterality Date   BIOPSY  08/23/2021   Procedure: BIOPSY;  Surgeon: [name], MD;  Location: [facility] ENDOSCOPY;  Service: Gastroenterology;;   COLONOSCOPY WITH PROPOFOL N/A 08/23/2021   Procedure: COLONOSCOPY WITH PROPOFOL;  Surgeon: [name], MD;  Location: [place];  Service: Gastroenterology;  Laterality: N/A;   ESOPHAGOGASTRODUODENOSCOPY (EGD) WITH PROPOFOL N/A 08/23/2021   Procedure: ESOPHAGOGASTRODUODENOSCOPY (EGD) WITH PROPOFOL;  Surgeon: [name], MD;  Location: [place];  Service: Gastroenterology;  Laterality: N/A;   HAND SURGERY Right    POLYPECTOMY  08/23/2021   Procedure: POLYPECTOMY;  Surgeon: [name], MD;  Location: [facility] ENDOSCOPY;  Service: Gastroenterology;;   Social History:  reports that he has been smoking cigarettes. He has a 37.00 pack-year smoking history. He has never used smokeless tobacco. He reports current alcohol use. He reports that he does not use drugs.  Family / Support Systems Marital Status: Single Children: [name], [name] (Sisters) Anticipated Caregiver: [name] and [name] Ability/Limitations of Caregiver: none Caregiver Availability: 24/7 Family Dynamics: support from sisters  Social History Preferred language: English Religion:  Health Literacy - How often do you need to have someone help you when you read instructions, pamphlets, or other written material from your doctor or pharmacy?: Sometimes Writes: Yes Employment Status: Unemployed   Abuse/Neglect Abuse/Neglect Assessment Can Be Completed: Yes Physical Abuse: Denies Verbal Abuse:  Denies Sexual Abuse: Denies Exploitation of patient/patient's resources: Denies Self-Neglect: Denies  Patient response to: Social Isolation - How often do you feel lonely or isolated from those around you?: Never  Emotional Status Recent Psychosocial Issues: coping Psychiatric History: n/a Substance Abuse History: Tobacco abuse  Patient / Family Perceptions, Expectations & Goals Pt/Family understanding of illness & functional limitations: yes Premorbid pt/family roles/activities: patient previously independent overall Anticipated changes in roles/activities/participation: Patient sister plans on moving inside the home with patient Pt/family expectations/goals: Supervision to [place]: None Premorbid Home Care/DME Agencies: Other (Comment) ([facility]) Transportation available at discharge: sisters able to transport Is the patient able to respond to transportation needs?: Yes In the past 12 months, has lack of transportation kept you from medical appointments or from getting medications?: No In the past 12 months, has lack of transportation kept you from meetings, work, or from getting things needed for daily living?: No Resource referrals recommended: Neuropsychology  Discharge Planning Living Arrangements: Other relatives Support Systems: Other relatives Type of Residence: Private residence Insurance Resources: Medicaid (specify county) Museum/gallery curator Resources: Family Support Financial Screen Referred: No Living Expenses: Lives with family Money Management: Patient, Family Does the patient have any problems obtaining your medications?: No Home Management: Independent Patient/Family Preliminary Plans: Sisters able to assist Care Coordinator Anticipated Follow Up Needs: HH/OP Expected length of stay: 7-10 Days  Clinical Impression Covering for primary SW, Beck D.  Sw met with patient, introduced self and explained role. SW made attempt to contact  patient sister, left VM. Patient plans to discharge home with assistance from his sisters. No additional questions or concerns, sw will continue to follow up.   Dyanne Iha 01/02/2022, 1:31 PM

## 2022-01-02 NOTE — Progress Notes (Signed)
Occupational Therapy Session Note  Patient Details  Name: Chad Santos MRN: [id number] Date of Birth: 1960-12-27  Today's Date: 01/02/2022 OT Individual Time: 1427-1520 OT Individual Time Calculation (min): 53 min    Short Term Goals: Week 1:  OT Short Term Goal 1 (Week 1): Pt will be Supervision for UB/LB ADLs OT Short Term Goal 2 (Week 1): Pt will be Supervion for functional transfers OT Short Term Goal 3 (Week 1): Pt will demonstrate improved attention to the L side during ADLs OT Short Term Goal 4 (Week 1): Pt will demonstrate improved strength in the L UE to 4+/5  Skilled Therapeutic Interventions/Progress Updates:  Skilled OT intervention completed with focus on unsupported UE dynamic balance, L visual scanning/attention, suction education. Pt received seated in w/c, agreeable to unplanned OT session, no c/o pain. Transported pt dependently in w/c <> gym for time. Sit > stand using RW with CGA at [facility], with attempt to start balance task however pt with urge to use bathroom, with inability to control his bladder. Transported pt to hallway bathroom, with sit > stand and stand pivot using RW with CGA, with incontinent void in brief noted, with pt stating "I felt the need to go but didn't know I already went." Doffed pants in stance with CGA for balance only. Pt unable to void further on commode, therefore threaded new brief with max A in stance. Back at [facility], pt stood without AD with CGA for dynamic reaching task to promote balance needed for ADLs. Pt with significant inattention/L visual field cut on L side, with pt missing 19 bells on L side, with pt indicating he thought he got them all. Transitioned task to sitting to see if pt could focus further if in seated position however with consistent L sided field cut, with therapist cueing pt to look at his L hand and follow it to pointing on L side with therapist providing total A however pt unable to maintain visual tracking. Transitioned to  visual tracking task with retrieving cones on L side and placing on R side of body, with multimodal cues needed for gaining pt's attention to the L, however only 2 occurrences where pt met visual contact with therapist's eyes. Increased awareness demonstrated when therapist walked to other side of room during cleaning and pt able to bring head/eyes to L fully but with limited duration. Of note- pt with consistent coughing, with pt demonstrating difficulty with swallowing his salvia. Back in room, therapist provided education to pt on use of suction for self-use as pt reported not knowing how to use it. Pt was able to return demonstrate on/off and proper placement. Doffed/donned new pants at the sit > stand level with CGA for balance and min A for threading over feet. Pt was left seated in w/c, with belt alarm on, telesitter on and all needs in reach at end of session.    Therapy Documentation Precautions:  Precautions Precautions: Fall Precaution Comments: Mild L hemi. R gaze with L visual field cut Restrictions Weight Bearing Restrictions: No    Therapy/Group: Individual Therapy  Blase Mess, MS, OTR/L  01/02/2022, 3:23 PM

## 2022-01-03 DIAGNOSIS — M545 Low back pain, unspecified: Secondary | ICD-10-CM | POA: Diagnosis not present

## 2022-01-03 DIAGNOSIS — I639 Cerebral infarction, unspecified: Secondary | ICD-10-CM | POA: Diagnosis not present

## 2022-01-03 DIAGNOSIS — I10 Essential (primary) hypertension: Secondary | ICD-10-CM | POA: Diagnosis not present

## 2022-01-03 DIAGNOSIS — N189 Chronic kidney disease, unspecified: Secondary | ICD-10-CM | POA: Diagnosis not present

## 2022-01-03 NOTE — Progress Notes (Signed)
PROGRESS NOTE   Subjective/Complaints: Reports mild back pain this AM, has not tried PRN medications.  Review of Systems  Constitutional:  Negative for chills and fever.  HENT:  Positive for congestion.   Respiratory:  Negative for shortness of breath.   Cardiovascular:  Negative for chest pain.  Gastrointestinal:  Positive for constipation. Negative for abdominal pain, diarrhea, nausea and vomiting.      Objective:   No results found. Recent Labs    01/02/22 0532  WBC 5.3  HGB 14.1  HCT 42.3  PLT 232    Recent Labs    01/02/22 0532  NA 137  K 4.2  CL 104  CO2 24  GLUCOSE 93  BUN 23  CREATININE 1.54*  CALCIUM 8.8*     Intake/Output Summary (Last 24 hours) at 01/03/2022 1147 Last data filed at 01/03/2022 0837 Gross per 24 hour  Intake 820 ml  Output 50 ml  Net 770 ml        Physical Exam: Vital Signs Blood pressure 129/78, pulse 86, temperature 98 F (36.7 C), resp. rate 18, height '6\' 1"'$  (1.854 m), weight 72.6 kg, SpO2 96 %.  Physical Exam Constitutional:      General: He is not in acute distress.    Appearance: Normal appearance. He is not ill-appearing.  HENT:     Head: Normocephalic and atraumatic.     Right Ear: External ear normal.     Left Ear: External ear normal.     Nose: Nose normal.  Eyes:     Pupils: Pupils are equal, round, and reactive to light.  Cardiovascular:     Rate and Rhythm: Normal rate and regular rhythm.  Pulmonary:     Effort: Pulmonary effort is normal.     Breath sounds: CTAB Abdominal:     General: Bowel sounds are normal.     Palpations: Abdomen is soft.     Tenderness: There is no abdominal tenderness.  Musculoskeletal:        General: No swelling.     Comments: Bilateral hallux valgus  No significant paraspinal tenderness, minimal L lat tenderness Skin:    General: Skin is warm and dry.  Neurological:     Mental Status: He is alert and oriented to  person, place, and time.     Comments: Oriented. Fair insight and awareness. Able to provide some biographical information. Severe dysarthria. Left VII and X11 deficits, did not seem to be able to visually track to left or much past midline for that matter. Motor 5/5 RUE and RLE and 4+/5 LUE and LLE. No focal sensory findings.   Psychiatric:     Comments: Pleasant         Assessment/Plan: 1. Functional deficits which require 3+ hours per day of interdisciplinary therapy in a comprehensive inpatient rehab setting. Physiatrist is providing close team supervision and 24 hour management of active medical problems listed below. Physiatrist and rehab team continue to assess barriers to discharge/monitor patient progress toward functional and medical goals  Care Tool:  Bathing    Body parts bathed by patient: Right arm, Left arm, Chest, Abdomen, Front perineal area, Buttocks, Right upper leg, Left upper leg,  Right lower leg, Left lower leg, Face         Bathing assist Assist Level: Contact Guard/Touching assist     Upper Body Dressing/Undressing Upper body dressing   What is the patient wearing?: Pull over shirt    Upper body assist Assist Level: Contact Guard/Touching assist    Lower Body Dressing/Undressing Lower body dressing      What is the patient wearing?: Underwear/pull up     Lower body assist Assist for lower body dressing: Contact Guard/Touching assist     Toileting Toileting    Toileting assist Assist for toileting: Contact Guard/Touching assist     Transfers Chair/bed transfer  Transfers assist     Chair/bed transfer assist level: Minimal Assistance - Patient > 75%     Locomotion Ambulation   Ambulation assist      Assist level: Minimal Assistance - Patient > 75% Assistive device: Walker-rolling Max distance: 251f   Walk 10 feet activity   Assist     Assist level: Minimal Assistance - Patient > 75% Assistive device: Walker-rolling    Walk 50 feet activity   Assist    Assist level: Minimal Assistance - Patient > 75% Assistive device: Walker-rolling    Walk 150 feet activity   Assist    Assist level: Minimal Assistance - Patient > 75% Assistive device: Walker-rolling    Walk 10 feet on uneven surface  activity   Assist     Assist level: Minimal Assistance - Patient > 75% Assistive device: Walker-rolling   Wheelchair     Assist Is the patient using a wheelchair?: No             Wheelchair 50 feet with 2 turns activity    Assist            Wheelchair 150 feet activity     Assist          Blood pressure 129/78, pulse 86, temperature 98 F (36.7 C), resp. rate 18, height '6\' 1"'$  (1.854 m), weight 72.6 kg, SpO2 96 %.  Medical Problem List and Plan: 1. 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             -patient may shower             -ELOS/Goals: 7-10 days, mod I to supervision with PT, OT, SLP  -Continue CIR with PT,OT,SLP 2.  Antithrombotics: -DVT/anticoagulation:  Pharmaceutical: Lovenox             -antiplatelet therapy: Plavix 75 mg monotherapy 3. Pain Management: Tylenol as needed,Robaxin as needed  -Advised to try PRN tylenol for back pain, suspect this is mild MSK soreness due to working with therapy 4. Mood/Sleep: LCSW to evaluate and provide emotional support             -antipsychotic agents: n/a 5. Neuropsych/cognition: This patient is not quite capable of making decisions on his own behalf.             -use telesitter for safety 6. Skin/Wound Care: Routine skin care checks 7. Fluids/Electrolytes/Nutrition: routine Is and Os and recheck chemistries 8: Hypertension: ? home meds losartan, potassium supplement -"permissive HTN" post-CVA -norvasc 2.'5mg'$  initiated -6/13 improved this AM, monitor trend 9:  Hyperlipidemia: allow permissive hypertension; home losartan held as of 6/9 10: Tobacco use: cessation counseling; continue nicotine patch 11: Alcohol use: cessation counseling 12:  THC use: advised of stroke risk 13: Obesity: dietary consultation 14: H. pylori associated peptic ulcer disease: status post repeat EGD 12/09/2021 with no bleeding.  History of upper GI bleeding secondary to Mallory-Weiss tear Jan 2023             --continue PPI BID 15: Elevated creatinine:  -Appears chronically elevated, likely CKD, advised oral fluid intake  -Recheck labs 16. Post-stroke dysphagia: D2, thins 17. Constipation   -Multiple Bms have been recorded over past few days in chart, ordered PRN miralax  LOS: 2 days A FACE TO [place] 01/03/2022, 11:47 AM

## 2022-01-03 NOTE — Progress Notes (Addendum)
Speech Language Pathology Daily Session Note  Patient Details  Name: Chad Santos MRN: [id number] Date of Birth: March 05, 1961  Today's Date: 01/03/2022 SLP Individual Time: 0102-7253 Total Time Calculation: 43 minutes  Short Term Goals: Week 1: SLP Short Term Goal 1 (Week 1): STG=LTG due to ELOS 7-10 days  Skilled Therapeutic Interventions: Pt seen this date for skilled ST intervention targeting deglutition and cognitive-linguistic goals outlined in care plan. Pt received awake/alert and upright in bed, eating breakfast with NT providing full supervision. Prolonged mastication observed. Agreeable to ST intervention at bedside.  Pt observed with current diet textures (Dysphagia 3 with thin liquids) and demonstrated significantly prolonged mastication and oral stasis/residuals, which resulted in oral expectoration following verbal cues from SLP. Pt only consumed ~15% of breakfast and ~3 oz of liquid before stating that he was full and did not want anymore. Required Max A to improve awareness of oral residuals and pocketing on L side, and implement aspiration precautions. Given pt's ongoing oro-motor deficits, recommend downgrade to Dysphagia 2 textures with thin liquid. Continue to recommend full supervision and alternating bites/sips.   Re: cognition, pt oriented x 4 with Sup A. Answered questions re: purpose for PT, OT ,and ST intervention given Max A following education. Mod-Max A for interpreting daily therapy schedule. Max A for visual scanning from right to left field. Education provided re: ST POC and current deficits; he verbalized understanding though will need reinforcement.  Pt left in bed with all safety measures activated. Reporting pain in back; LPN notified. Call bell within reach and all immediate needs met. Continue per current ST POC.  Addendum: Per PT, pt continued with significant oral dysphagia with Dysphagia 2 textures at lunch; therefore, pt downgraded to Dysphagia 1 with  thin liquids; LPN notified.  Pain At the conclusion of today's session, pt reporting back pain; LPN notified.  Therapy/Group: Individual Therapy  Ayda Tancredi A Leontae Bostock 01/03/2022, 1:09 PM

## 2022-01-03 NOTE — Discharge Instructions (Addendum)
Inpatient Rehab Discharge Instructions  Chad Santos Discharge date and time: 01/09/2022  Activities/Precautions/ Functional Status: Activity: no lifting, driving, or strenuous exercise until cleared by MD Diet:  dysphagia 2; finely chopped solid food  Wound Care: none needed Functional status:  ___ No restrictions     ___ Walk up steps independently ___ 24/7 supervision/assistance   ___ Walk up steps with assistance __x_ Intermittent supervision/assistance  ___ Bathe/dress independently ___ Walk with walker     ___ Bathe/dress with assistance ___ Walk Independently    ___ Shower independently ___ Walk with assistance    __x_ Shower with assistance __x_ No alcohol     ___ Return to work/school ________  Special Instructions:  No driving, alcohol consumption or tobacco use.   COMMUNITY REFERRALS UPON DISCHARGE:    Home Health:   PT  OT  [place]    Phone: [phone]  Medical Equipment/Items Ordered:  3 IN 1                                                 Agency/Supplier:[facility]  [phone]   STROKE/TIA DISCHARGE INSTRUCTIONS SMOKING Cigarette smoking nearly doubles your risk of having a stroke & is the single most alterable risk factor  If you smoke or have smoked in the last 12 months, you are advised to quit smoking for your health. Most of the excess cardiovascular risk related to smoking disappears within a year of stopping. Ask you doctor about anti-smoking medications [place] Quit Line: 1-800-QUIT NOW Free Smoking Cessation Classes [phone]  CHOLESTEROL Know your levels; limit fat & cholesterol in your diet  Lipid Panel     Component Value Date/Time   CHOL 189 12/28/2021 1844   TRIG 86 12/28/2021 1844   HDL 56 12/28/2021 1844   CHOLHDL 3.4 12/28/2021 1844   VLDL 17 12/28/2021 1844   LDLCALC 116 (H) 12/28/2021 1844     Many patients benefit from treatment even if their cholesterol is at goal. Goal: Total Cholesterol  (CHOL) less than 160 Goal:  Triglycerides (TRIG) less than 150 Goal:  HDL greater than 40 Goal:  LDL (LDLCALC) less than 100   BLOOD PRESSURE American Stroke Association blood pressure target is less that 120/80 mm/Hg  Your discharge blood pressure is:  BP: 129/78 Monitor your blood pressure Limit your salt and alcohol intake Many individuals will require more than one medication for high blood pressure  DIABETES (A1c is a blood sugar average for last 3 months) Goal HGBA1c is under 7% (HBGA1c is blood sugar average for last 3 months)  Diabetes: No known diagnosis of diabetes    Lab Results  Component Value Date   HGBA1C 6.1 (H) 12/28/2021    Your HGBA1c can be lowered with medications, healthy diet, and exercise. Check your blood sugar as directed by your physician Call your physician if you experience unexplained or low blood sugars.  PHYSICAL ACTIVITY/REHABILITATION Goal is 30 minutes at least 4 days per week  Activity: Increase activity slowly, Therapies: Physical Therapy: Home Health, Occupational Therapy: Home Health, and Speech Therapy: Home Health Return to work: when cleared by MD Activity decreases your risk of heart attack and stroke and makes your heart stronger.  It helps control your weight and blood pressure;  helps you relax and can improve your mood. Participate in a regular exercise program. Talk with your doctor about the best form of exercise for you (dancing, walking, swimming, cycling).  DIET/WEIGHT Goal is to maintain a healthy weight  Your discharge diet is:  Diet Order             DIET DYS 3 Room service appropriate? Yes; Fluid consistency: Thin  Diet effective now                  thin liquids Your height is:  Height: '6\' 1"'$  (185.4 cm) Your current weight is: Weight: 72.6 kg Your Body Mass Index (BMI) is:  BMI (Calculated): 21.12 Following the type of diet specifically designed for you will help prevent another stroke. Your goal weight range is:    Your goal Body Mass Index (BMI) is 19-24. Healthy food habits can help reduce 3 risk factors for stroke:  High cholesterol, hypertension, and excess weight.  RESOURCES Stroke/Support Group:  Call [phone]   STROKE EDUCATION PROVIDED/REVIEWED AND GIVEN TO PATIENT Stroke warning signs and symptoms How to activate emergency medical system (call 911). Medications prescribed at discharge. Need for follow-up after discharge. Personal risk factors for stroke. Pneumonia vaccine given: No Flu vaccine given: No My questions have been answered, the writing is legible, and I understand these instructions.  I will adhere to these goals & educational materials that have been provided to me after my discharge from the hospital.      My questions have been answered and I understand these instructions. I will adhere to these goals and the provided educational materials after my discharge from the hospital.  Patient/Caregiver Signature _______________________________ Date __________  Clinician Signature _______________________________________ Date __________  Please bring this form and your medication list with you to all your follow-up doctor's appointments.

## 2022-01-03 NOTE — Progress Notes (Signed)
Physical Therapy Session Note  Patient Details  Name: Chad Santos MRN: [id number] Date of Birth: [birth year]  Today's Date: 01/03/2022 PT Individual Time: 1000-1059 + 1445-1515 PT Individual Time Calculation (min): 59 min  + 30 min  Short Term Goals: Week 1:  PT Short Term Goal 1 (Week 1): STG = LTG due to ELOS  Skilled Therapeutic Interventions/Progress Updates:      1st session: Pt sitting in w/c to start - agreeable to PT tx. Reports unrated LBP, denies request for pain medication but has on heat packs for management. Transported in w/c to main rehab gym for time. Stand<>pivot transfer to mat table with CGA and RW. Gait training with no AD ~128f with CGA, cues for increasing stride length and visual scanning to the L. In ADL apartment, practiced furniture transfers from low sitting sofa and recliner with CGA and no AD. Pt reports he spends most of his time outside on the porch while his sister works.   Completed BERG balance test with results outlined below. Pt had the most difficulty with standing feet together/tandem, alternating toe taps. Educated him on falls risk and recommendation for RW use at all times to reduce falls risk. He voiced understanding.  Patient demonstrates increased fall risk as noted by score of 34/56 on Berg Balance Scale.  (<36= high risk for falls, close to 100%; 37-45 significant >80%; 46-51 moderate >50%; 52-55 lower >25%)  Instructed in standing visual scanning activity with push pins and colored targets on figure - standing with UE support on high/low table with supervision for standing balance. Figure placed on his L side to promote sustained visual scanning and oculomotor training. Pt is 100% accurate with push pins to colors but only completed the right 1/2 of the circle.   Pt requesting to use bathroom. Returned to room in w/c and completed ambulatory transfer with no AD and required minA due to poor safety awareness with urinary urgency. Pt unable to  void despite urge. Returned to bed with minA and no AD. Bed mobility completed with supervision. Bed alarm on, yaunker suction in reach, call bell in lap. All needs met.   2nd session: Pt eating lunch in bed on arrival - no supervision provided from staff despite SLP recommendation. Observed pt pocketing foot in L cheek, taking large bites, and having trouble chewing his Dys 2 meal. Reminded him of SLP precautions for eating. Pt ultimately had to spit out a mouthful of food due to too large of bites and difficulty chewing. SLP notified of difficulties. Bed mobility completed with supervision. Sit<>stand to RW with supervision and ambulated ~1565fto day room rehab gym with supervision and RW - cues for keeping body within walker frame and scanning environment on L side. SetupA on Nustep where he completed 10 minutes with BUE/BLE, level 6 resistance. Able to keep cadence ~30 steps/minute for entirety. Ambulated back to room with supervision and RW with similar cues as above. Pt reporting need to toilet and he was continent of bladder. Concluded session seated in w/c with safety belt alarm on and all needs met. NT called to provide handoff of care to provide supervision for him to finish his lunch. All needs met.     Therapy Documentation Precautions:  Precautions Precautions: Fall Precaution Comments: Mild L hemi. R gaze with L visual field cut Restrictions Weight Bearing Restrictions: No General:    Balance: Balance Balance Assessed: Yes Standardized Balance Assessment Standardized Balance Assessment: Berg Balance Test Berg Balance Test Sit to Stand:  Able to stand  independently using hands Standing Unsupported: Able to stand 2 minutes with supervision Sitting with Back Unsupported but Feet Supported on Floor or Stool: Able to sit safely and securely 2 minutes Stand to Sit: Controls descent by using hands Transfers: Able to transfer with verbal cueing and /or supervision Standing  Unsupported with Eyes Closed: Able to stand 10 seconds safely Standing Ubsupported with Feet Together: Able to place feet together independently but unable to hold for 30 seconds From Standing, Reach Forward with Outstretched Arm: Can reach forward >5 cm safely (2") From Standing Position, Pick up Object from Floor: Able to pick up shoe, needs supervision From Standing Position, Turn to Look Behind Over each Shoulder: Looks behind one side only/other side shows less weight shift Turn 360 Degrees: Needs close supervision or verbal cueing Standing Unsupported, Alternately Place Feet on Step/Stool: Able to complete >2 steps/needs minimal assist Standing Unsupported, One Foot in Front: Able to take small step independently and hold 30 seconds Standing on One Leg: Tries to lift leg/unable to hold 3 seconds but remains standing independently Total Score: 34/56  Therapy/Group: Individual Therapy  Alger Simons 01/03/2022, 7:32 AM

## 2022-01-03 NOTE — Progress Notes (Signed)
Occupational Therapy Session Note  Patient Details  Name: Chad Santos MRN: [id number] Date of Birth: September 02, 1960  Today's Date: 01/03/2022 OT Individual Time: 5027-7412 OT Individual Time Calculation (min): 70 min    Short Term Goals: Week 1:  OT Short Term Goal 1 (Week 1): Pt will be Supervision for UB/LB ADLs OT Short Term Goal 2 (Week 1): Pt will be Supervion for functional transfers OT Short Term Goal 3 (Week 1): Pt will demonstrate improved attention to the L side during ADLs OT Short Term Goal 4 (Week 1): Pt will demonstrate improved strength in the L UE to 4+/5  Skilled Therapeutic Interventions/Progress Updates:   Pt states 10/10 pain in lower back.  Made nurse aware who assessed at beginning of session and administered prescribed pain medication to address.  Pt semi reclined in bed. Supine to sit with mod I.  Sit to stand and ambulated ~5 feet to sink using RW with close supervision.  Pt then turning and attempting to ambulate to bathroom but running into obstacles including w/c and tray table needing assist and max multimodal cues for safety awareness and moving obstacles.  Pt completed toilet transfer with close supervision using BSC over toilet.  Pt attempted but unsuccessful and self initiated standing then attempted to begin ambulating with brief still around bilateral ankles.  Pt needing multimodal cues for safety awareness and to initiate pulling up over hips prior to ambulating.  Pt then ambulated to sink and completed stand to sit at w/c with close supervision.  Pt needing intermittent Vcs to initiate sequencing through bathing, dressing, and oral hygiene due to patient completing one task then sitting in w/c for extended time without initiating next step.  Pt completed UB bathing, dressing, oral hygiene seated with supervision.  LB bathing and dressing (brief pretabbed and donn clean paper scrubs) at sit<>stand level with close supervision.  Transported to ortho gym via w/c and  completed left visual scanning and pursuits using BITs (w/c placed with BITS screen on left) and poker chip matching.  Tactile cues to keep head facing forward and encourage visual motor skills leftward eye movement.  Pt able to locate and tap 49 medium dots on BITs first trial then 69 second trial. Returned to room and remained seated in w/c with pillow to support, small heat packs applied to lower back and nurse made aware.  Call bell in reach, seat alarm on.     Therapy Documentation Precautions:  Precautions Precautions: Fall Precaution Comments: Mild L hemi. R gaze with L visual field cut Restrictions Weight Bearing Restrictions: No    Therapy/Group: Individual Therapy  Ezekiel Slocumb 01/03/2022, 10:04 AM

## 2022-01-04 LAB — BASIC METABOLIC PANEL WITH GFR
Anion gap: 9 (ref 5–15)
BUN: 20 mg/dL (ref 8–23)
CO2: 26 mmol/L (ref 22–32)
Calcium: 9 mg/dL (ref 8.9–10.3)
Chloride: 104 mmol/L (ref 98–111)
Creatinine, Ser: 1.29 mg/dL — ABNORMAL HIGH (ref 0.61–1.24)
GFR, Estimated: >60 mL/min (ref >60)
Glucose, Bld: 91 mg/dL (ref 70–99)
Potassium: 4.1 mmol/L (ref 3.5–5.1)
Sodium: 139 mmol/L (ref 135–145)

## 2022-01-04 MED ORDER — LIDOCAINE 5 % EX PTCH
1.0000 | MEDICATED_PATCH | CUTANEOUS | Status: DC
  Administered 2022-01-04 – 2022-01-08 (×5): 1 via TRANSDERMAL
  Filled 2022-01-04 (×5): qty 1

## 2022-01-04 MED ORDER — MELATONIN 3 MG PO TABS
3.0000 mg | ORAL_TABLET | Freq: Every day | ORAL | Status: DC
  Administered 2022-01-04 – 2022-01-08 (×5): 3 mg via ORAL
  Filled 2022-01-04 (×5): qty 1

## 2022-01-04 NOTE — Progress Notes (Signed)
Patient requested sleeping meds at 7pm, informed him that meds were scheduled for 2200. Patient anxious most of the night, requesting sleep meds despite meds being administered. Xanax was then given 0245 pt rested the remaining of the night.

## 2022-01-04 NOTE — Progress Notes (Signed)
PROGRESS NOTE   Subjective/Complaints: + low back pain Team conference today +insomnia- appreciate nursing note, he was requesting sleep medication at 7pm last night  Review of Systems  Constitutional:  Negative for chills and fever.  HENT:  Positive for congestion.   Respiratory:  Negative for shortness of breath.   Cardiovascular:  Negative for chest pain.  Gastrointestinal:  Positive for constipation. Negative for abdominal pain, diarrhea, nausea and vomiting.  +insomnia    Objective:   No results found. Recent Labs    01/02/22 0532  WBC 5.3  HGB 14.1  HCT 42.3  PLT 232    Recent Labs    01/02/22 0532 01/04/22 0511  NA 137 139  K 4.2 4.1  CL 104 104  CO2 24 26  GLUCOSE 93 91  BUN 23 20  CREATININE 1.54* 1.29*  CALCIUM 8.8* 9.0     Intake/Output Summary (Last 24 hours) at 01/04/2022 1052 Last data filed at 01/04/2022 0850 Gross per 24 hour  Intake 955 ml  Output 100 ml  Net 855 ml         Physical Exam: Vital Signs Blood pressure (!) 164/89, pulse 91, temperature 98.6 F (37 C), resp. rate 18, height '6\' 1"'$  (1.854 m), weight 72.6 kg, SpO2 100 %.  Physical Exam Constitutional:      General: He is not in acute distress. BMI 21.12    Appearance: Normal appearance. He is not ill-appearing.  HENT:     Head: Normocephalic and atraumatic.     Right Ear: External ear normal.     Left Ear: External ear normal.     Nose: Nose normal.  Eyes:     Pupils: Pupils are equal, round, and reactive to light.  Cardiovascular:     Rate and Rhythm: Normal rate and regular rhythm.  Pulmonary:     Effort: Pulmonary effort is normal.     Breath sounds: CTAB Abdominal:     General: Bowel sounds are normal.     Palpations: Abdomen is soft.     Tenderness: There is no abdominal tenderness.  Musculoskeletal:        General: No swelling.     Comments: Bilateral hallux valgus  No significant paraspinal  tenderness, minimal L lat tenderness Skin:    General: Skin is warm and dry.  Neurological:     Mental Status: He is alert and oriented to person, place, and time.     Comments: Oriented. Fair insight and awareness. Able to provide some biographical information. Severe dysarthria. Left VII and X11 deficits, did not seem to be able to visually track to left or much past midline for that matter. Motor 5/5 RUE and RLE and 4+/5 LUE and LLE. No focal sensory findings.   Psychiatric:     Comments: Pleasant         Assessment/Plan: 1. Functional deficits which require 3+ hours per day of interdisciplinary therapy in a comprehensive inpatient rehab setting. Physiatrist is providing close team supervision and 24 hour management of active medical problems listed below. Physiatrist and rehab team continue to assess barriers to discharge/monitor patient progress toward functional and medical goals  Care Tool:  Bathing  Body parts bathed by patient: Right arm, Left arm, Chest, Abdomen, Front perineal area, Buttocks, Right upper leg, Left upper leg, Right lower leg, Left lower leg, Face         Bathing assist Assist Level: Contact Guard/Touching assist     Upper Body Dressing/Undressing Upper body dressing   What is the patient wearing?: Pull over shirt    Upper body assist Assist Level: Contact Guard/Touching assist    Lower Body Dressing/Undressing Lower body dressing      What is the patient wearing?: Underwear/pull up     Lower body assist Assist for lower body dressing: Contact Guard/Touching assist     Toileting Toileting    Toileting assist Assist for toileting: Contact Guard/Touching assist     Transfers Chair/bed transfer  Transfers assist     Chair/bed transfer assist level: Minimal Assistance - Patient > 75%     Locomotion Ambulation   Ambulation assist      Assist level: Minimal Assistance - Patient > 75% Assistive device: Walker-rolling Max  distance: 257f   Walk 10 feet activity   Assist     Assist level: Minimal Assistance - Patient > 75% Assistive device: Walker-rolling   Walk 50 feet activity   Assist    Assist level: Minimal Assistance - Patient > 75% Assistive device: Walker-rolling    Walk 150 feet activity   Assist    Assist level: Minimal Assistance - Patient > 75% Assistive device: Walker-rolling    Walk 10 feet on uneven surface  activity   Assist     Assist level: Minimal Assistance - Patient > 75% Assistive device: WChemical engineer    Assist Is the patient using a wheelchair?: No             Wheelchair 50 feet with 2 turns activity    Assist            Wheelchair 150 feet activity     Assist          Blood pressure (!) 164/89, pulse 91, temperature 98.6 F (37 C), resp. rate 18, height '6\' 1"'$  (1.854 m), weight 72.6 kg, SpO2 100 %.  Medical Problem List and Plan: 1. 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             -patient may shower             -ELOS/Goals: 7-10 days, mod I to supervision with PT, OT, SLP  -Continue CIR with PT,OT,SLP  -Interdisciplinary Team Conference today   2.  Impaired mobility and ADLs: discontinue Lovenox given ambulation 400 feet             -antiplatelet therapy: Plavix 75 mg monotherapy 3. Low back pain: Lidocaine patch ordered. Tylenol as needed,Robaxin as needed  -Advised to try PRN tylenol for back pain, suspect this is mild MSK soreness due to working with therapy 4. Insomnia: scheduled melatonin at 7pm 5. Neuropsych/cognition: This patient is not quite capable of making decisions on his own behalf.             -use telesitter for safety 6. Skin/Wound Care: Routine skin care checks 7. Fluids/Electrolytes/Nutrition: routine Is and Os and recheck  chemistries 8: Hypertension: ? home meds losartan, potassium supplement -"permissive HTN" post-CVA -norvasc 2.'5mg'$  initiated -6/13 improved this AM, monitor trend 9: Hyperlipidemia: allow permissive hypertension;  home losartan held as of 6/9 10: Tobacco use: cessation counseling; continue nicotine patch 11: Alcohol use: cessation counseling 12: THC use: advised of stroke risk 13: Obesity: dietary consultation 14: H. pylori associated peptic ulcer disease: status post repeat EGD 12/09/2021 with no bleeding.  History of upper GI bleeding secondary to Mallory-Weiss tear Jan 2023             --continue PPI BID 15: Elevated creatinine:  -Appears chronically elevated, likely CKD, advised oral fluid intake  -Recheck labs 16. Post-stroke dysphagia: D2, thins 17. Constipation   -Multiple Bms have been recorded over past few days in chart, ordered PRN miralax  LOS: 3 days A FACE TO FACE EVALUATION WAS PERFORMED  Martha Clan P Dajion Bickford 01/04/2022, 10:52 AM

## 2022-01-04 NOTE — Progress Notes (Signed)
Occupational Therapy Session Note  Patient Details  Name: Chad Santos MRN: [id number] Date of Birth: [birth year]  Today's Date: 01/04/2022 OT Individual Time: 1000-1100 OT Individual Time Calculation (min): 60 min    Short Term Goals: Week 1:  OT Short Term Goal 1 (Week 1): Pt will be Supervision for UB/LB ADLs OT Short Term Goal 2 (Week 1): Pt will be Supervion for functional transfers OT Short Term Goal 3 (Week 1): Pt will demonstrate improved attention to the L side during ADLs OT Short Term Goal 4 (Week 1): Pt will demonstrate improved strength in the L UE to 4+/5  Skilled Therapeutic Interventions/Progress Updates:    Pt sitting up in w/c, reports little to no pain in lower back this session after receiving pain medications.  Pt agreeable to going outside during this session.  Transported to [employer] and completed seated visual motor/scanning to left activity to promote eye scanning and pursuits to left to grasp bean bags and throw in midline view.  Pt needing max multimodal cues with difficulty motor planning to look with significant right gaze persisting.  Pt noted compensating by turning head to left and using peripheral vision.  Pt completed sit to stand and ambulation using RW ~100 feet with obstacle navigation component to ambulate around tables and return to w/c placed on left side of environment with close supervision.  Pt then completed left visual scanning task using bean bags, this time in standing and placed in lower left visual field.  Pt required CGA to complete and cues for safe management of RW.  Pt transported back to room, call bell in reach, seat alarm on.  Therapy Documentation Precautions:  Precautions Precautions: Fall Precaution Comments: Mild L hemi. R gaze with L visual field cut Restrictions Weight Bearing Restrictions: No    Therapy/Group: Individual Therapy  Ezekiel Slocumb 01/04/2022, 12:33 PM

## 2022-01-04 NOTE — Progress Notes (Signed)
Physical Therapy Session Note  Patient Details  Name: Chad Santos MRN: [id number] Date of Birth: [birth year]  Today's Date: 01/04/2022 PT Individual Time: 3953-2023 + 1130-1155 PT Individual Time Calculation (min): 59 min + 25 min  Short Term Goals: Week 1:  PT Short Term Goal 1 (Week 1): STG = LTG due to ELOS  Skilled Therapeutic Interventions/Progress Updates:     1st session: Direct handoff of care from SLP to start session - per SLP, pt's diet downgraded to puree. Pt agreeable to PT tx and reports 7/10 LBP - MD made aware during morning rounded. Bed mobility completed with supervision. He donn's disposable pants with minA for threading only.   Sit<>stand to RW with supervision and able to pull pants over hips in standing with supervision for balance. Ambulated ~149f to day room rehab gym with supervision and RW. Cues for keeping body within walker frame, visual scanning L, and increasing gait speed. In gym, worked on dynamic standing, L visual scanning by shooting basketball to goal that was placed on L side - close supervision for standing balance during this task.   Then completed 6MWT using RW and supervision - covered 3949fwithin the 6 minutes. Age/sex matched norm = 1876 ft. Gait speed = 0.33 m/s, indicative of household ambulator and increased falls risk. Gait distance covered limited by baseline gait speed - pt reports no fatigue after 6MWT.   Stair training using 4# ankle weights, 6 inch steps, and 2 hand rails. Navigates these with CGA with reciprocal stepping, forward facing. Cues for monitoring foot placement on L and guiding L hand along rail - pt's visual deficits impacting safety with stairs and will require close supervision to CGA at discharge for these - he reports he has 4 STE home with 2 rails.   Lateral step ups to L side to promote L attention using 6inch step and 1 rail, 4# ankle weights for strengthening and improving awareness - CGA for completing 1x15 with mod  cues needed for L foot awareness due to overcrowding while stepping.  Ambulated back to his room, ~20031fwith supervision and RW - cues for increasing gait speed and upright posture. Minimal corrections. Pt returned to bed with supervision. All needs met, bed alarm on.   2nd session: Pt sitting in w/c to start session - agreeable to PT tx. No reports of pain during session. He requests to go outside as he did in prior OT session. [facility], worked on gaiPersonnel officerth no AD, ambulating >150f49fth CGA, a few self corrected LOB while he navigated unlevel surfaces (inclines/declines). All surfaces paved or brick pavers. Also practiced stair training outdoors going up/down x4, 6inch steps using 1 rail on R - step to pattern forward facing but grasping rail on R with both hands. Practiced crossing streets with emphasis on visual scanning to the L to ensure safety. Returned upstairs to his room. Remained seated in w/c with safety belt alarm on, call bell in reach and all needs met.    Therapy Documentation Precautions:  Precautions Precautions: Fall Precaution Comments: Mild L hemi. R gaze with L visual field cut Restrictions Weight Bearing Restrictions: No General:    Therapy/Group: Individual Therapy  Chad Simons4/2023, 7:34 AM

## 2022-01-04 NOTE — Progress Notes (Addendum)
Speech Language Pathology Daily Session Note  Patient Details  Name: Chad Santos MRN: [id number] Date of Birth: September 11, 1960  Today's Date: 01/04/2022 SLP Individual Time: 0732-0832 SLP Individual Time Calculation (min): 60 min  Short Term Goals: Week 1: SLP Short Term Goal 1 (Week 1): STG=LTG due to ELOS 7-10 days  Skilled Therapeutic Interventions: Pt seen this date for skilled ST intervention targeting deglutition and speech intelligibility goals outlined in care plan. Pt received awake/alert and sitting upright in bed. NT delivering breakfast tray. Flat affect noted throughout. Pt agreeable to ST intervention at bedside.   SLP facilitated today's session by providing re-education re: aspiration precautions (slow rate, single + small bites/sips, check for L pocketing, liquid rinse, and upright positioning) and rationale for implementation, prior to presenting AM meal tray. Immediately following education, pt recalled aspiration precautions with Min-Mod A and implemented with Mod-Max faded to Min A. Oral phase was noted to be more timely with Dysphagia 1 textures with less oral residuals. Pt began to utilize liquid rinse more independently as meal progressed. Overall pt consumed 100% of his pureed eggs and 100% of pureed waffle + 4 oz of orange juice with Min A from SLP for loading spoon and feeding. No clinical s/sx concerning for pharyngeal dysphagia during consumption of current diet textures (Dysphagia 1 with thin liquids). Coughing and belching was noted post-prandially, with pt endorses acid reflux prior to CVA. Per chart review, temp and SpO2 WNL, lungs CTAB per MD note, and taking scheduled PPI once daily.   Re: speech intelligibility, SLP provided education re: speech intelligibility strategies (e.g. A BOSS/SLOP), and utilized voice recording + rating scale (1-5) in an effort to provide biofeedback and improve pt's awareness of his dysarthria. During voice recording, pt was  unintelligible when producing spontaneous sentence on probe; however, following education and use of voice recording, pt's intelligibility improved to 50% within a known context at the phrase level. Provided positive reinforcement and brief education re: principles of neuro-plasticity ("use it and improve it"), and how practice can be beneficial in improving his comprehensibility to the listener; he verbalized understanding, though will benefit from reinforcement.   Pt left in bed with all safety measures activated. Direct hand off to PT. Continue per current ST POC.   Pain Pt reports 8 out of 10 lower back pain; SLP offered repositioning and distraction. RN notified.   Therapy/Group: Individual Therapy  Chad Santos A Chad Santos 01/04/2022, 11:46 AM

## 2022-01-04 NOTE — Patient Care Conference (Signed)
Inpatient RehabilitationTeam Conference and Plan of Care Update Date: 01/04/2022   Time: 11:13 AM    Patient Name: Chad Santos      Medical Record Number: [id number]  Date of Birth: [birth year] Sex: Male         Room/Bed: 4W01C/4W01C-01 Payor Info: Payor: MEDICAID [place] / Plan: MEDICAID [place] ACCESS / Product Type: *No Product type* /    Admit Date/Time:  01/01/2022  3:00 PM  Primary Diagnosis:  Acute ischemic stroke [facility])  Hospital Problems: Principal Problem:   Acute ischemic stroke [facility])    Expected Discharge Date: Expected Discharge Date: 01/09/22  Team Members Present: Physician leading conference: Dr. Leeroy Cha Social Worker Present: Erlene Quan, BSW Nurse Present: Dorien Chihuahua, RN PT Present: Ginnie Smart, PT OT Present: Leretha Pol, OT SLP Present: Helaine Chess, SLP PPS Coordinator present : Gunnar Fusi, SLP     Current Status/Progress Goal Weekly Team Focus  Bowel/Bladder   continent to bowel and bladder, so accidents with urinal and urgency/frequency LBM 6/13  to maintain continence      Swallow/Nutrition/ Hydration   Max A for implementation of aspiration precautions, Diet tolerance monitoring of puree and thin liquids.  Sup A  Diet tolerance monitoring, errorless learning of aspiration precautions.   ADL's   CGA-supervision self care and ambulatory functional transfers; significant right gaze  supervision-mod I  functional transfers, visual scanning/ visual pursuits left hemisphere, safety awareness, dc planning, self care training   Mobility   supervision bed mobility, CGA sit<>stand and stand<>pivot transfers, CGA gait using RW 152f. L visual field cut with R gaze preference  Supervision to mod I  Dynamic standing and gait training, L visual scanning and compensatory strategies, home safety and fall prevention, DC planning   Communication   Max A for use of speech intelligibility strategies  Mod A for 60% intelligibility at phrase  level  Speech intelligibility strategy practice in structured speech tasks   Safety/Cognition/ Behavioral Observations  Mod A  Problem-solving (basic daily situations) - Min A; Recall - Min A; Attention - Min A for 5-10 minutes (sustained); Emergent awareness - Min A  functional cognitive tasks   Pain   Denies pain  denies pain  assess every shift   Skin   laceration to left forearm  wound to show signs of healing and  no S/S of infection  assess every shift     Discharge Planning:  discharging home with assistance from 2 sisters to provide assist   Team Discussion: Patient with anxiety and insomnia and low back pain. MD added lidocaine patch for pain. Note right gaze preference and left neglect/field cut with oral dysphagia; inattention to oral bolus, perseverates on chewing and decreased awareness of intellectual strategies, voice intensity deficits.  Patient on target to meet rehab goals: yes, currently needs CGA for ADLs, Completes stand pivot and sit- stand with supervision. Completed 400' in 6 minute walk test; usual pace for him. Goals for discharge set for supervision overall.  *See Care Plan and progress notes for long and short-term goals.   Revisions to Treatment Plan:  Voice recording for intensity awareness Changed to puree diet with thin liquids  Teaching Needs: Safety, intellectual strategies, scanning left, medications, dietary modifications, etc  Current Barriers to Discharge: Decreased caregiver support  Possible Resolutions to Barriers: Family education with sister HH follow up services DME: RW     Medical Summary Current Status: kidney injury, severe left sided neglect, anxiety/insomnia, flat affect, dysphagia, low back pain, acute ischemic stroke  Barriers to Discharge: Medical stability  Barriers to Discharge Comments: kidney injury, severe left sided neglect, anxiety/insomnia, flat affect, dysphagia, low back pain, acute ischemic stroke Possible  Resolutions to [employer] Focus: continue to monitor creatinine, continue Xanax prn, scheduled melatonin at 7pm, scheduled lidocaine patch at 7pm, continue Lipitor '80mg'$    Continued Need for Acute Rehabilitation Level of Care: The patient requires daily medical management by a physician with specialized training in physical medicine and rehabilitation for the following reasons: Direction of a multidisciplinary physical rehabilitation program to maximize functional independence : Yes Medical management of patient stability for increased activity during participation in an intensive rehabilitation regime.: Yes Analysis of laboratory values and/or radiology reports with any subsequent need for medication adjustment and/or medical intervention. : Yes   I attest that I was present, lead the team conference, and concur with the assessment and plan of the team.   Dorien Chihuahua B 01/04/2022, 3:29 PM

## 2022-01-04 NOTE — IPOC Note (Addendum)
Overall Plan of Care [facility]) Patient Details Name: Chad Santos MRN: [id number] DOB: 1960/10/24  Admitting Diagnosis: Acute ischemic stroke [facility])  Hospital Problems: Principal Problem:   Acute ischemic stroke [facility])     Functional Problem List: Nursing Medication Management, Pain, Endurance, Safety  PT Balance, Endurance, Motor, Safety  OT Balance, Vision, Perception, Cognition  SLP Cognition  TR         Basic ADL's: OT Bathing, Dressing, Toileting     Advanced  ADL's: OT       Transfers: PT Bed Mobility, Bed to Chair, Car  OT Tub/Shower     Locomotion: PT Ambulation, Stairs     Additional Impairments: OT Fuctional Use of Upper Extremity  SLP Swallowing, Communication, Social Cognition expression Problem Solving, Memory, Awareness, Attention  TR      Anticipated Outcomes Item Anticipated Outcome  Self Feeding Mod I  Swallowing  Supervision A   Basic self-care  Supervision-Mod I  Toileting  Supervision   Bathroom Transfers Mod I  Bowel/Bladder  N/A  Transfers  supervision  Locomotion  supervision  Communication  Mod A  Cognition  Min-Mod A  Pain  At or below level 4 with prns  Safety/Judgment  maintain w cues   Therapy Plan: PT Intensity: Minimum of 1-2 x/day ,45 to 90 minutes PT Frequency: 5 out of 7 days PT Duration Estimated Length of Stay: 7-9 days OT Intensity: Minimum of 1-2 x/day, 45 to 90 minutes OT Frequency: 5 out of 7 days OT Duration/Estimated Length of Stay: 7-10 days SLP Intensity: Minumum of 1-2 x/day, 30 to 90 minutes SLP Frequency: 3 to 5 out of 7 days SLP Duration/Estimated Length of Stay: 7-10 days   Team Interventions: Nursing Interventions Disease Management/Prevention, Medication Management, Discharge Planning, Patient/Family Education, Pain Management, Dysphagia/Aspiration Precaution Training  PT interventions Ambulation/gait training, Discharge planning, Functional mobility training, Psychosocial support,  Therapeutic Activities, Visual/perceptual remediation/compensation, Therapeutic Exercise, Skin care/wound management, Neuromuscular re-education, Balance/vestibular training, Disease management/prevention, UE/LE Strength taining/ROM, Wheelchair propulsion/positioning, Pain management, DME/adaptive equipment instruction, Cognitive remediation/compensation, Splinting/orthotics, UE/LE Coordination activities, Stair training, Functional electrical stimulation, Patient/family education, Community reintegration  OT Interventions Training and development officer, Functional mobility training, Therapeutic Activities, Visual/perceptual remediation/compensation, UE/LE Strength taining/ROM, UE/LE Coordination activities, Therapeutic Exercise, Self Care/advanced ADL retraining, Patient/family education, Neuromuscular re-education, Discharge planning  SLP Interventions Cognitive remediation/compensation, Cueing hierarchy, Dysphagia/aspiration precaution training, Patient/family education, Internal/external aids, Functional tasks  TR Interventions    SW/CM Interventions Discharge Planning, Psychosocial Support, Patient/Family Education, Disease Management/Prevention   Barriers to Discharge MD  Medical stability  Nursing Decreased caregiver support, Home environment access/layout    PT Decreased caregiver support, Lack of/limited family support, Home environment Child psychotherapist, Insurance underwriter for SNF coverage    OT Home environment Child psychotherapist    SLP      SW       Team Discharge Planning: Destination: PT-Home ,OT- Home , SLP-Home Projected Follow-up: PT-Home health PT, 24 hour supervision/assistance, OT-  Home health OT, SLP-Home Health SLP, Outpatient SLP, 24 hour supervision/assistance Projected Equipment Needs: PT-To be determined, OT- TBD , SLP-None recommended by SLP Equipment Details: PT- , OT-  Patient/family involved in discharge planning: PT- Patient,  OT-Patient, SLP-Patient  MD ELOS: 7-10 days Medical  Rehab Prognosis:  Excellent Assessment: The patient has been admitted for CIR therapies with the diagnosis of right sided lacunar infarct. The team will be addressing functional mobility, strength, stamina, balance, safety, adaptive techniques and equipment, self-care, bowel and bladder mgt, patient and caregiver education. Goals have been set at modI/S. Anticipated discharge  destination is home.    See Team Conference Notes for weekly updates to the plan of care

## 2022-01-05 MED ORDER — TRAZODONE HCL 50 MG PO TABS
100.0000 mg | ORAL_TABLET | Freq: Every day | ORAL | Status: DC
  Administered 2022-01-05: 100 mg via ORAL
  Filled 2022-01-05: qty 2

## 2022-01-05 NOTE — Progress Notes (Signed)
Speech Language Pathology Daily Session Note  Patient Details  Name: Chad Santos MRN: [id number] Date of Birth: August 11, 1960  Today's Date: 01/05/2022 SLP Individual Time: 0800-0900 and 1215-1225 SLP Individual Time Calculation (min): 60 min and 10 minutes  Short Term Goals: Week 1: SLP Short Term Goal 1 (Week 1): STG=LTG due to ELOS 7-10 days  Skilled Therapeutic Interventions:  Session 1: Skilled ST treatment focused on speech, swallowing, and cognitive goals. Pt had already consumed breakfast meal tray prior to arrival but agreeable to participate in dysphagia 2 PO trial. Patient consumed bite sized graham cracker coated in pudding for added moisture with prolonged yet functional mastication, no significant oral residuals, and no pharyngeal symptoms. Pt implemented liquid rinse and lingual sweep independently. SLP recommends dys 2 trial tray at lunch today for further assessment prior to consideration of diet advancement. SLP reinforced education on speech intelligibility strategies using A BOSS acronym. Prior to edu pt was only able to recall "talk loud" from previous sessions. Following education, pt was able to recall up to 3 strategies at supervision level while referring to handout provided in room. Pt implemented speech intelligibility strategies at the word with mod fading to min A to achieve ~50-75% intelligibility at the word level, and 50% at the phrase/short sentence level with known, structured contexts. Pt was able to self correct on occasion. SLP and pt completing medication management task to increase awareness of current medication regime. SLP provided pt with individualized medication chart which contained the following information: name of medication, dose, instructions, and purpose for taking. Pt ID pertinent information on chart with sup A fading to mod I. Unable to assess recall of medications following review of chart due to time constraints. Pt responded problem solving  scenarios re: pill labels/organization with min A verbal cues. Patient was passed off to PT at end of session.   Session 2: Pt seen for skilled analysis of dys 2 trial tray tolerance. Pt continues to exhibit prolonged yet functional mastication, mild oral residuals, and delayed cough x1 with liquid rinse. Pt required low stim environment and min A verbal redirection cues in effort to reduce distractibility during meal, as fidgeting with items within reach appeared to reduce mastication effectiveness; oral prep/effectiveness improved when distractions were reduced/eliminated. Pt independently implemented lingual sweep and alternated between solids and liquids every 2-3 bites, consumed meal at appropriate pace, and took appropriate size bites and sips. SLP recommends diet advancement to dys 2 textures with thin liquids. Cont full supervision at this time. Pt informed + educated and verbalized understanding and agreement with plan. Pt left in wheelchair and immediate needs met. Cont SLP POC.     Pain Pain Assessment Pain Scale: 0-10 Pain Score: 0-No pain  Therapy/Group: Individual Therapy  Patty Sermons 01/05/2022, 8:05 AM

## 2022-01-05 NOTE — Progress Notes (Signed)
[name], male   DOB: 1961/02/21, 61 y.o.   MRN: [id number]  Team Conference Report to Patient/Family  Team Conference discussion was reviewed with the patient and caregiver, including goals, any changes in plan of care and target discharge date.  Patient and caregiver express understanding and are in agreement.  The patient has a target discharge date of 01/09/22.  SW met with patient and called sister, Chad Santos at bedside. Sw provided team conference updates and provided patient discharge date. Patient sister has a preference of patient receiving [facility] services. Sw will confirm with therapy that patient has [place] recommendations. No additional questions or concerns, patient sister will pick patient up at discharge.  Chad Santos 01/05/2022, 12:16 PM

## 2022-01-05 NOTE — Progress Notes (Signed)
Physical Therapy Session Note  Patient Details  Name: Chad Santos MRN: [id number] Date of Birth: July 11, 1961  Today's Date: 01/05/2022 PT Individual Time: 0902-1013 PT Individual Time Calculation (min): 71 min   Short Term Goals: Week 1:  PT Short Term Goal 1 (Week 1): STG = LTG due to ELOS  Skilled Therapeutic Interventions/Progress Updates:     Direct handoff of care from SLP to start. Pt agreeable and reports minimal LBP - mobility and emotional support provided for pain management. Pt requesting to go outside for part of his therapy session. Bed mobility completed mod I. Stand<>pivot transfer with CGA and no AD to his L to encourage L visual scanning. Transported outside [facility]. Gait training outdoors with no AD and CGA for safety due to instability and difficulty navigating sloped areas. Gait >271f outdoors, included weaving in/out of pillars, visual scanning to objects on L, transfers from park benches, and navigating unfamilar environments. Returned upstairs to [employer] worked on [employer] - visual pursuit from R to L direction and rotating CCW sequencing A-Z. Required mod cues for visual scanning and min cues for sequencing letters A-Z on rotation. Standing with CGA and no UE support during these tasks. Returned to room and pt concluded session seated in w/c with safety belt alarm on and all needs within reach. Pt requesting Magic [employer] ice cream - LPN notified.   Therapy Documentation Precautions:  Precautions Precautions: Fall Precaution Comments: Mild L hemi. R gaze with L visual field cut Restrictions Weight Bearing Restrictions: No General:      Therapy/Group: Individual Therapy  CAlger Simons6/15/2023, 7:34 AM

## 2022-01-05 NOTE — Progress Notes (Signed)
PROGRESS NOTE   Subjective/Complaints: Still not sleeping well, likes to sleep at 7pm, discussed starting trazodone '25mg'$  at 7pm and he is agreeable He has no other complaints Upgraded to D2 diet today!  Review of Systems  Constitutional:  Negative for chills and fever.  HENT:  Positive for congestion.   Respiratory:  Negative for shortness of breath.   Cardiovascular:  Negative for chest pain.  Gastrointestinal:  Positive for constipation. Negative for abdominal pain, diarrhea, nausea and vomiting.  +insomnia, +dysphagia    Objective:   No results found. No results for input(s): "WBC", "HGB", "HCT", "PLT" in the last 72 hours.  Recent Labs    01/04/22 0511  NA 139  K 4.1  CL 104  CO2 26  GLUCOSE 91  BUN 20  CREATININE 1.29*  CALCIUM 9.0     Intake/Output Summary (Last 24 hours) at 01/05/2022 0933 Last data filed at 01/05/2022 0753 Gross per 24 hour  Intake 620 ml  Output 400 ml  Net 220 ml         Physical Exam: Vital Signs Blood pressure (!) 150/95, pulse 86, temperature 98 F (36.7 C), resp. rate 18, height '6\' 1"'$  (1.854 m), weight 72.6 kg, SpO2 98 %.  Physical Exam Constitutional:      General: He is not in acute distress. BMI 21.12    Appearance: Normal appearance. He is not ill-appearing.  HENT:     Head: Normocephalic and atraumatic.     Right Ear: External ear normal.     Left Ear: External ear normal.     Nose: Nose normal.  Eyes:     Pupils: Pupils are equal, round, and reactive to light.  Cardiovascular:     Rate and Rhythm: Normal rate and regular rhythm.  Pulmonary:     Effort: Pulmonary effort is normal.     Breath sounds: CTAB Abdominal:     General: Bowel sounds are normal.     Palpations: Abdomen is soft.     Tenderness: There is no abdominal tenderness.  Musculoskeletal:        General: No swelling.     Comments: Bilateral hallux valgus  No significant paraspinal  tenderness, minimal L lat tenderness Skin:    General: Skin is warm and dry.  Neurological:     Mental Status: He is alert and oriented to person, place, and time.     Comments: Oriented. Fair insight and awareness. Able to provide some biographical information. Severe dysarthria. Difficulty with motor planning. Left VII and X11 deficits, did not seem to be able to visually track to left or much past midline for that matter. Motor 5/5 RUE and RLE and 4+/5 LUE and LLE. No focal sensory findings.   Psychiatric:     Comments: Pleasant         Assessment/Plan: 1. Functional deficits which require 3+ hours per day of interdisciplinary therapy in a comprehensive inpatient rehab setting. Physiatrist is providing close team supervision and 24 hour management of active medical problems listed below. Physiatrist and rehab team continue to assess barriers to discharge/monitor patient progress toward functional and medical goals  Care Tool:  Bathing    Body parts  bathed by patient: Right arm, Left arm, Chest, Abdomen, Front perineal area, Buttocks, Right upper leg, Left upper leg, Right lower leg, Left lower leg, Face         Bathing assist Assist Level: Contact Guard/Touching assist     Upper Body Dressing/Undressing Upper body dressing   What is the patient wearing?: Pull over shirt    Upper body assist Assist Level: Contact Guard/Touching assist    Lower Body Dressing/Undressing Lower body dressing      What is the patient wearing?: Underwear/pull up     Lower body assist Assist for lower body dressing: Contact Guard/Touching assist     Toileting Toileting    Toileting assist Assist for toileting: Contact Guard/Touching assist     Transfers Chair/bed transfer  Transfers assist     Chair/bed transfer assist level: Minimal Assistance - Patient > 75%     Locomotion Ambulation   Ambulation assist      Assist level: Minimal Assistance - Patient > 75% Assistive  device: Walker-rolling Max distance: 264f   Walk 10 feet activity   Assist     Assist level: Minimal Assistance - Patient > 75% Assistive device: Walker-rolling   Walk 50 feet activity   Assist    Assist level: Minimal Assistance - Patient > 75% Assistive device: Walker-rolling    Walk 150 feet activity   Assist    Assist level: Minimal Assistance - Patient > 75% Assistive device: Walker-rolling    Walk 10 feet on uneven surface  activity   Assist     Assist level: Minimal Assistance - Patient > 75% Assistive device: WChemical engineer    Assist Is the patient using a wheelchair?: No             Wheelchair 50 feet with 2 turns activity    Assist            Wheelchair 150 feet activity     Assist          Blood pressure (!) 150/95, pulse 86, temperature 98 F (36.7 C), resp. rate 18, height '6\' 1"'$  (1.854 m), weight 72.6 kg, SpO2 98 %.  Medical Problem List and Plan: 1. 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             -patient may shower             -ELOS/Goals: 7-10 days, mod I to supervision with PT, OT, SLP  -Continue CIR with PT,OT,SLP 2.  Impaired mobility and ADLs: discontinue Lovenox given ambulation 400 feet             -antiplatelet therapy: Continue Plavix 75 mg monotherapy 3. Low back pain: continue Lidocaine patch. Tylenol as needed, Robaxin as needed 4. Insomnia: scheduled melatonin at 7pm, changed trazodone '100mg'$  timing to 7pm.  5. Neuropsych/cognition: This patient is not quite capable of making decisions on his own behalf.             -use telesitter for safety 6. Skin/Wound Care: Routine skin care checks 7. Fluids/Electrolytes/Nutrition: routine Is and Os and recheck chemistries 8: Hypertension: ? home meds losartan, potassium  supplement -"permissive HTN" post-CVA -norvasc 2.'5mg'$  initiated -6/13 improved this AM, monitor trend 9: Hyperlipidemia: allow permissive hypertension; home losartan held as of 6/9 10: Tobacco use: cessation counseling; continue nicotine patch 11: Alcohol use: cessation counseling 12:  THC use: advised of stroke risk 13: Obesity: dietary consultation 14: H. pylori associated peptic ulcer disease: status post repeat EGD 12/09/2021 with no bleeding.  History of upper GI bleeding secondary to Mallory-Weiss tear Jan 2023             --continue PPI BID 15: Elevated creatinine:  -Appears chronically elevated, likely CKD, advised oral fluid intake  -Recheck labs 16. Post-stroke dysphagia: D2, thins 17. Constipation   -Multiple Bms have been recorded over past few days in chart, ordered PRN miralax  LOS: 4 days A FACE TO FACE EVALUATION WAS PERFORMED  Clide Deutscher Nautia Lem 01/05/2022, 9:33 AM

## 2022-01-05 NOTE — Progress Notes (Signed)
Physical Therapy Session Note  Patient Details  Name: Chad Santos MRN: [id number] Date of Birth: 07-29-1960  Today's Date: 01/05/2022 PT Individual Time: 1410-1435 PT Individual Time Calculation (min): 25 min   Short Term Goals: Week 1:  PT Short Term Goal 1 (Week 1): STG = LTG due to ELOS  Skilled Therapeutic Interventions/Progress Updates:     Patient in bed upon PT arrival. Patient alert and agreeable to PT session. Patient denied pain during session.  Patient incontinent of bladder at beginning of session, patient without awareness of this and required cuing to go to the bathroom to change. Following toileting, focused session on high intensity gait training for improved balance and forward propulsion during gait.   Therapeutic Activity: Bed Mobility: Patient performed supine to/from sit with mod I in a flat bed without use of bed rails.  Transfers: Patient performed sit to/from stand from the bed and toilet with supervision using a RW. Provided verbal cues for scooting forward and forward weight shift with demonstration for technique x1. Patient performed an ambulatory transfer to the bathroom and sink with supervision using RW, performed peri-care with set-up assist and hand hygiene with max cues for locating soap and paper towels. Performed standing balance with supervision with and without upper extremity support without LOB during peri-care and hand hygiene.   Gait Training:  Patient ambulated ~300 feet progressing from RW with supervision to no AD with CGA, min facilitation x50 feet to promote forward propulsion and increased gait speed, progressing to CGA-close supervision with increased gait speed and arm swing ~50 feet. Provided verbal cues for looking ahead, increased R foot clearance, leading with his heel at initial contact to promote increased step length, and increased arm swing and gait speed for improved balance.   Patient sitting up in bed at end of session with  breaks locked, bed alarm set, and all needs within reach.   Therapy Documentation Precautions:  Precautions Precautions: Fall Precaution Comments: Mild L hemi. R gaze with L visual field cut Restrictions Weight Bearing Restrictions: No    Therapy/Group: Individual Therapy  Adelina Collard L Alaria Oconnor PT, DPT  01/05/2022, 5:10 PM

## 2022-01-05 NOTE — Progress Notes (Signed)
Occupational Therapy Session Note  Patient Details  Name: Chad Santos MRN: [id number] Date of Birth: [birth year]  Today's Date: 01/05/2022 OT Individual Time: 1100-1200 OT Individual Time Calculation (min): 60 min    Short Term Goals: Week 1:  OT Short Term Goal 1 (Week 1): Pt will be Supervision for UB/LB ADLs OT Short Term Goal 2 (Week 1): Pt will be Supervion for functional transfers OT Short Term Goal 3 (Week 1): Pt will demonstrate improved attention to the L side during ADLs OT Short Term Goal 4 (Week 1): Pt will demonstrate improved strength in the L UE to 4+/5  Skilled Therapeutic Interventions/Progress Updates:    Pt states he would like to shower and go outside during this session. No pain per pt.    Pt sitting in w/c, sit to stand at RW with CGA, and ambulated to bathroom and pivot to shower bench in walk in shower with CGA.  Doffed clothing and bathed UB/LB using lateral leans to clean buttocks with close supervision.  Dried off with supervision, and ambulated to w/c in room using RW with CGA.  Donned shirt with setup and brief and pants with min assist with pt needing multimodal cues for problem solving and left visual scanning/attention due to initially attempting to put BLE in same pant leg. Donned socks with setup.  Pt transported via w/c to atrium porch and completed functional mobility training using RW with visual demo of keeping gait inside frame of RW and closer to self then pt return demo'd with CGA to ambulate ~50 feet.  Pt completed dynamic standing activity of retrieving various resistive clothespins from left lateral pant leg and cues to scan visually to locate.  Pt completed with CGA and cues to shift weight anteriorly due to slight posterior bias noted.  Returned to room via w/c. Call bell in reach, seat alarm on.       Therapy Documentation Precautions:  Precautions Precautions: Fall Precaution Comments: Mild L hemi. R gaze with L visual field  cut Restrictions Weight Bearing Restrictions: No    Therapy/Group: Individual Therapy  Ezekiel Slocumb 01/05/2022, 12:53 PM

## 2022-01-06 DIAGNOSIS — R1310 Dysphagia, unspecified: Secondary | ICD-10-CM

## 2022-01-06 DIAGNOSIS — G4700 Insomnia, unspecified: Secondary | ICD-10-CM

## 2022-01-06 MED ORDER — TRAZODONE HCL 50 MG PO TABS
150.0000 mg | ORAL_TABLET | Freq: Every day | ORAL | Status: DC
  Administered 2022-01-06 – 2022-01-08 (×3): 150 mg via ORAL
  Filled 2022-01-06 (×3): qty 3

## 2022-01-06 NOTE — Progress Notes (Signed)
Occupational Therapy Session Note  Patient Details  Name: Chad Santos MRN: [id number] Date of Birth: 1960-11-18  Today's Date: 01/06/2022 OT Individual Time: 0845-1000 OT Individual Time Calculation (min): 75 min    Short Term Goals: Week 1:  OT Short Term Goal 1 (Week 1): Pt will be Supervision for UB/LB ADLs OT Short Term Goal 2 (Week 1): Pt will be Supervion for functional transfers OT Short Term Goal 3 (Week 1): Pt will demonstrate improved attention to the L side during ADLs OT Short Term Goal 4 (Week 1): Pt will demonstrate improved strength in the L UE to 4+/5  Skilled Therapeutic Interventions/Progress Updates:    Pt semi reclined in bed, stating he couldn't sleep last night, asking to go outside for session.  No pain.  Supine to sit with supervision.  Sit to stand with close supervision at [facility] and took a few steps to sink.  Doffed gown and bathed UB with distant supervision.  Donned shirt with setup in sitting.  Donned pants with supervision at sit<>stand level.  Pt transported to atrium porch and completed 3 x 10 reps heel raises in standing at RW.  Completed ambulatory level item retrieval on left side with close supervision needing cues to keep RW closer and centered.  Pt noted with improved scanning to left today needing less cues.  Pt also completed obstacle maneuvering left and right ambulation ~50 feet using RW with supervision.  Sit<>stand blocked practice at [facility] with cues for safe hand placement and prevention of premature sitting.  Pt also cued to shift weight anteriorly due to lifting toes off floor and exhibiting a posterior bias.  Transported back to room and pt requested to return to bed.  Stand pivot and sit to supine with supervision. Call bell in reach, bed alarm on.  Therapy Documentation Precautions:  Precautions Precautions: Fall Precaution Comments: Mild L hemi. R gaze with L visual field cut Restrictions Weight Bearing Restrictions: No    Therapy/Group:  Individual Therapy  Ezekiel Slocumb 01/06/2022, 2:46 PM

## 2022-01-06 NOTE — Progress Notes (Signed)
Pt continues to notify this nurse about being restless and unable to sleep. All PRNs have been given and not effective. Provider will be made aware.

## 2022-01-06 NOTE — Progress Notes (Signed)
Physical Therapy Session Note  Patient Details  Name: Chad Santos MRN: [id number] Date of Birth: 16-Mar-1961  Today's Date: 01/06/2022 PT Individual Time: 3094-0768 + 1445-1526  PT Individual Time Calculation (min): 30 min  + 41 min  Short Term Goals: Week 1:  PT Short Term Goal 1 (Week 1): STG = LTG due to ELOS  Skilled Therapeutic Interventions/Progress Updates:      1st session: Pt supine in bed to start - agreeable to PT tx. Denies pain. Bed mobility completed with supervision. Sit<>Stand to RW with supervision. Ambulates from his room to day room rehab gym with supervision and RW - cues for increasing gait speed and visual scanning to L. Setup on Kinetron completing 81mnutes with 20cm/sec resistance - cues for full ROM and increasing cadence. Returned to room and pt remained seated in w/c at end of session. Safety belt alarm on, all needs met. Pt requesting to use restroom. Notified NT due to time constraints.    2nd session: Pt sitting in w/c to start and requests to go outside. [facility] Gait training outdoors with no AD with CGA for safety - pt required mod cues for visual scanning to the L as he is easily distracted from environment and people. He's ambulating ~2013fdistances without fatigue. While outside, we discussed DC planning, home safety, fall prevention, DME rec's, and role of f/u therapy services. Returned upstairs and seutp on Nustep - completed 10 minutes with L4 resistance, targeting reciprocal movement patterns and L attention. Required minA for stand<>pivot back to w/c due to prematurely sitting prior to being fully over the w/c. Reminded him of importance of continued visual scanning for safety and fall prevention. Returned to his room and pt requesting to remain seated next to windows. Safety belt alarm on, call bell in reach, all needs met.      Therapy Documentation Precautions:  Precautions Precautions:  Fall Precaution Comments: Mild L hemi. R gaze with L visual field cut Restrictions Weight Bearing Restrictions: No General:     Therapy/Group: Individual Therapy  Latisha Lasch P Kimi Bordeau PT 01/06/2022, 7:24 AM

## 2022-01-06 NOTE — Progress Notes (Signed)
PROGRESS NOTE   Subjective/Complaints: Slept poorly again last night, will increase trazodone to '150mg'$ . Sleepy this morning No other issues reported overnight  Review of Systems  Constitutional:  Negative for chills and fever.  HENT:  Positive for congestion.   Respiratory:  Negative for shortness of breath.   Cardiovascular:  Negative for chest pain.  Gastrointestinal:  Positive for constipation. Negative for abdominal pain, diarrhea, nausea and vomiting.  +insomnia +dysphagia    Objective:   No results found. No results for input(s): "WBC", "HGB", "HCT", "PLT" in the last 72 hours.  Recent Labs    01/04/22 0511  NA 139  K 4.1  CL 104  CO2 26  GLUCOSE 91  BUN 20  CREATININE 1.29*  CALCIUM 9.0     Intake/Output Summary (Last 24 hours) at 01/06/2022 1043 Last data filed at 01/06/2022 0933 Gross per 24 hour  Intake 717 ml  Output 325 ml  Net 392 ml         Physical Exam: Vital Signs Blood pressure (!) 172/98, pulse 83, temperature 98.3 F (36.8 C), resp. rate 17, height '6\' 1"'$  (1.854 m), weight 72.6 kg, SpO2 98 %.  Physical Exam Constitutional:      General: He is not in acute distress. BMI 21.12. Somnolent    Appearance: Normal appearance. He is not ill-appearing.  HENT:     Head: Normocephalic and atraumatic.     Right Ear: External ear normal.     Left Ear: External ear normal.     Nose: Nose normal.  Eyes:     Pupils: Pupils are equal, round, and reactive to light.  Cardiovascular:     Rate and Rhythm: Normal rate and regular rhythm.  Pulmonary:     Effort: Pulmonary effort is normal.     Breath sounds: CTAB Abdominal:     General: Bowel sounds are normal.     Palpations: Abdomen is soft.     Tenderness: There is no abdominal tenderness.  Musculoskeletal:        General: No swelling.     Comments: Bilateral hallux valgus  No significant paraspinal tenderness, minimal L lat  tenderness Skin:    General: Skin is warm and dry.  Neurological:     Mental Status: He is alert and oriented to person, place, and time.     Comments: Oriented. Fair insight and awareness. Able to provide some biographical information. Severe dysarthria. Difficulty with motor planning. Left VII and X11 deficits, did not seem to be able to visually track to left or much past midline for that matter. Motor 5/5 RUE and RLE and 4+/5 LUE and LLE. No focal sensory findings.   Psychiatric:     Comments: Pleasant         Assessment/Plan: 1. Functional deficits which require 3+ hours per day of interdisciplinary therapy in a comprehensive inpatient rehab setting. Physiatrist is providing close team supervision and 24 hour management of active medical problems listed below. Physiatrist and rehab team continue to assess barriers to discharge/monitor patient progress toward functional and medical goals  Care Tool:  Bathing    Body parts bathed by patient: Right arm, Left arm, Chest, Abdomen, Front  perineal area, Buttocks, Right upper leg, Left upper leg, Right lower leg, Left lower leg, Face         Bathing assist Assist Level: Contact Guard/Touching assist     Upper Body Dressing/Undressing Upper body dressing   What is the patient wearing?: Pull over shirt    Upper body assist Assist Level: Contact Guard/Touching assist    Lower Body Dressing/Undressing Lower body dressing      What is the patient wearing?: Underwear/pull up     Lower body assist Assist for lower body dressing: Contact Guard/Touching assist     Toileting Toileting    Toileting assist Assist for toileting: Contact Guard/Touching assist     Transfers Chair/bed transfer  Transfers assist     Chair/bed transfer assist level: Minimal Assistance - Patient > 75%     Locomotion Ambulation   Ambulation assist      Assist level: Minimal Assistance - Patient > 75% Assistive device:  Walker-rolling Max distance: 226f   Walk 10 feet activity   Assist     Assist level: Minimal Assistance - Patient > 75% Assistive device: Walker-rolling   Walk 50 feet activity   Assist    Assist level: Minimal Assistance - Patient > 75% Assistive device: Walker-rolling    Walk 150 feet activity   Assist    Assist level: Minimal Assistance - Patient > 75% Assistive device: Walker-rolling    Walk 10 feet on uneven surface  activity   Assist     Assist level: Minimal Assistance - Patient > 75% Assistive device: WChemical engineer    Assist Is the patient using a wheelchair?: No             Wheelchair 50 feet with 2 turns activity    Assist            Wheelchair 150 feet activity     Assist          Blood pressure (!) 172/98, pulse 83, temperature 98.3 F (36.8 C), resp. rate 17, height '6\' 1"'$  (1.854 m), weight 72.6 kg, SpO2 98 %.  Medical Problem List and Plan: 1. 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             -patient may shower             -ELOS/Goals: 7-10 days, mod I to supervision with PT, OT, SLP  -Continue CIR with PT,OT,SLP 2.  Impaired mobility and ADLs: discontinue Lovenox given ambulation 400 feet             -antiplatelet therapy: Continue Plavix 75 mg monotherapy 3. Low back pain: continue Lidocaine patch. Tylenol as needed, Robaxin as needed 4. Insomnia: scheduled melatonin at 7pm, increase trazodone to 150 mg at 7pm.  5. Neuropsych/cognition: This patient is not quite capable of making decisions on his own behalf.             -use telesitter for safety 6. Skin/Wound Care: Routine skin care checks 7. Fluids/Electrolytes/Nutrition: routine Is and Os and recheck chemistries 8: Hypertension: ? home meds losartan, potassium supplement -"permissive  HTN" post-CVA -norvasc 2.'5mg'$  initiated -6/13 improved this AM, monitor trend 9: Hyperlipidemia: allow permissive hypertension; home losartan held as of 6/9 10: Tobacco use: cessation counseling; continue nicotine patch 11: Alcohol use: cessation counseling 12: THC use: advised of stroke risk 13: Obesity: dietary  consultation 14: H. pylori associated peptic ulcer disease: status post repeat EGD 12/09/2021 with no bleeding.  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 Constipation: continue prn miralax.   LOS: 5 days A FACE TO FACE EVALUATION WAS PERFORMED  Clide Deutscher Sean Malinowski 01/06/2022, 10:43 AM

## 2022-01-06 NOTE — Discharge Summary (Signed)
Physical Therapy Discharge Summary  Patient Details  Name: Chad Santos MRN: [id number] Date of Birth: 1960/08/02  {CHL IP REHAB PT TIME CALCULATION:304800500}   Patient has met {NUMBERS 0-12:18577} of 8 long term goals due to improved activity tolerance, improved balance, improved postural control, increased strength, ability to compensate for deficits, and functional use of  left upper extremity and left lower extremity.  Patient to discharge at an ambulatory level Supervision.   Patient's care partner is independent to provide the necessary physical and cognitive assistance at discharge.  Reasons goals not met: ***  Recommendation:  Patient will benefit from ongoing skilled PT services in home health setting to continue to advance safe functional mobility, address ongoing impairments in functional mobility, dynamic standing balance, gait training with LRAD, home safety, and minimize fall risk.  Equipment: RW  Reasons for discharge: treatment goals met and discharge from hospital  Patient/family agrees with progress made and goals achieved: Yes  PT Discharge Precautions/Restrictions Precautions Precautions: Fall Precaution Comments: R gaze with L visual field cut. Mild R hemi Restrictions Weight Bearing Restrictions: No   Pain Interference Pain Interference Pain Effect on Sleep: 2. Occasionally Pain Interference with Therapy Activities: 1. Rarely or not at all Pain Interference with Day-to-Day Activities: 2. Occasionally Vision/Perception  Vision - History Ability to See in Adequate Light: 0 Adequate Vision - Assessment Eye Alignment: Impaired (comment) Ocular Range of Motion: Restricted on the left;Impaired-to be further tested in functional context Tracking/Visual Pursuits: Decreased smoothness of vertical tracking;Decreased smoothness of horizontal tracking Convergence: Impaired (comment) Perception Perception: Impaired Inattention/Neglect: Does not attend to left  visual field;Does not attend to left side of body Praxis Praxis: Intact  Cognition Overall Cognitive Status: Impaired/Different from baseline Arousal/Alertness: Awake/alert Focused Attention: Appears intact Sustained Attention: Impaired Sustained Attention Impairment: Verbal complex;Functional complex Selective Attention: Impaired Selective Attention Impairment: Verbal basic;Functional basic Memory: Impaired Memory Impairment: Decreased recall of new information;Storage deficit Awareness: Impaired Awareness Impairment: Emergent impairment;Anticipatory impairment Problem Solving: Impaired Problem Solving Impairment: Functional basic;Verbal complex Safety/Judgment: Impaired Sensation Sensation Light Touch: Appears Intact Hot/Cold: Appears Intact Proprioception: Impaired by gross assessment Stereognosis: Not tested Coordination Gross Motor Movements are Fluid and Coordinated: No Coordination and Movement Description: L hemi, L visual field cut impacting mobility and movement patterns Heel Shin Test: WFL, slowed on L Motor  Motor Motor: Hemiplegia Motor - Discharge Observations: mild L hemi  Mobility Bed Mobility Bed Mobility: Sit to Supine;Supine to Sit Supine to Sit: Independent Sit to Supine: Independent Transfers Transfers: Sit to Stand;Stand Pivot Transfers;Stand to Sit Sit to Stand: Supervision/Verbal cueing Stand to Sit: Supervision/Verbal cueing Stand Pivot Transfers: Supervision/Verbal cueing Stand Pivot Transfer Details: Verbal cues for precautions/safety Transfer (Assistive device): Rolling walker Locomotion  Gait Ambulation: Yes Gait Assistance: Supervision/Verbal cueing Gait Distance (Feet): 200 Feet Assistive device: Rolling walker Gait Assistance Details: Verbal cues for safe use of DME/AE;Verbal cues for precautions/safety;Verbal cues for gait pattern Gait Gait: Yes Gait Pattern: Impaired Gait Pattern: Step-through pattern;Poor foot clearance -  left;Poor foot clearance - right;Wide base of support;Decreased stride length;Trunk flexed Stairs / Additional Locomotion Stairs: Yes Stairs Assistance: Contact Guard/Touching assist Stair Management Technique: Two rails Number of Stairs: 12 Height of Stairs: 6 Pick up small object from the floor assist level: Contact Guard/Touching assist Wheelchair Mobility Wheelchair Mobility: No  Trunk/Postural Assessment  Cervical Assessment Cervical Assessment: Within Functional Limits Thoracic Assessment Thoracic Assessment: Exceptions to [facility] (rounded shoulders) Lumbar Assessment Lumbar Assessment: Exceptions to [facility] (posterior pelvic tillt) Postural Control Postural Control: Deficits on evaluation  Righting Reactions: mild delay  Balance Balance Balance Assessed: Yes Static Sitting Balance Static Sitting - Balance Support: Feet supported;No upper extremity supported Static Sitting - Level of Assistance: 7: Independent Dynamic Sitting Balance Dynamic Sitting - Balance Support: Feet supported;No upper extremity supported;During functional activity Dynamic Sitting - Level of Assistance: 5: Stand by assistance Static Standing Balance Static Standing - Balance Support: Bilateral upper extremity supported Static Standing - Level of Assistance: 5: Stand by assistance Dynamic Standing Balance Dynamic Standing - Balance Support: During functional activity;Bilateral upper extremity supported Dynamic Standing - Level of Assistance: 5: Stand by assistance *** TUG *** BERG Extremity Assessment             Callin Ashe P Orpah Hausner PT 01/06/2022, 3:31 PM

## 2022-01-06 NOTE — Progress Notes (Signed)
[name], male   DOB: 07/12/1961, 61 y.o.   MRN: [id number]  [facility] referral sent to [facility]

## 2022-01-06 NOTE — Progress Notes (Signed)
Speech Language Pathology Daily Session Note  Patient Details  Name: Chad Santos MRN: [id number] Date of Birth: 1961/04/25  Today's Date: 01/06/2022 SLP Individual Time: 8548-8301 SLP Individual Time Calculation (min): 39 min  Short Term Goals: Week 1: SLP Short Term Goal 1 (Week 1): STG=LTG due to ELOS 7-10 days  Skilled Therapeutic Interventions: Pt seen this date for skilled ST interventions targeting deglutition and cognitive-linguistic goals outlined in care plan. Pt received awake/alert and beginning to consume AM meal with NT present. Flat affect. No c/o pain initially; however, at the end of session pt reporting back pain. Agreeable to ST intervention at bedside.  SLP facilitated today's session by providing skilled observation of recommended diet consistencies (Dysphagia 2 with thin liquids) from AM meal tray. Pt benefited from Mod verbal and visual A, faded to intermittent Min A for consistent implementation of aspiration precautions (slow rate, single + small bites/sips, oral residual clearance, liquid rinse, and use of pureed textures mixed with minced solids to aid in bolus cohesion + manipulation, and A-P transit). Self-fed with Min A. Additionally, SLP provided mirror for pt to self-monitor oral residuals, which pt utilized with initial Mod A, faded to Sup A. With use of mixing pureed with minced textures, pt consumed ~75% of his AM meal and 4 oz of thin liquid. Cough noted x 1 when liquid rinse was used in the setting of moderate oral residuals; suspect cough is likely r/t oral deficits. No s/sx concerning for pharyngeal dysphagia observed across consistencies. SLP provided verbal educaiton re: safe swallowing strategies, and ways to modify preferred food textures at home; he verbalized understanding, though will need reinforcement.  Re: cognition, pt participated in hypothetical verbal problem-solving questions re: safe oral consumption at home with Min A. Verbally recalled ~40%  of safe swallowing strategies with Sup A; 100% accuracy with Max A, at the completion of AM meal. Oriented x 4.  Pt left in bed with all safety measures activated. Call bell within reach and all immediate needs met. Continue per current ST POC.   Pain Pt denies pain; NAD  Therapy/Group: Individual Therapy  Arney Mayabb A Aedin Jeansonne 01/06/2022, 8:02 AM

## 2022-01-07 DIAGNOSIS — E785 Hyperlipidemia, unspecified: Secondary | ICD-10-CM

## 2022-01-07 MED ORDER — AMLODIPINE BESYLATE 5 MG PO TABS
5.0000 mg | ORAL_TABLET | Freq: Every day | ORAL | Status: DC
  Administered 2022-01-08 – 2022-01-09 (×2): 5 mg via ORAL
  Filled 2022-01-07 (×2): qty 1

## 2022-01-07 NOTE — Progress Notes (Signed)
PROGRESS NOTE   Subjective/Complaints: No new concerns or complaints this AM. Sitting in chair.  Review of Systems  Constitutional:  Negative for chills and fever.  HENT:  Positive for congestion.   Respiratory:  Negative for shortness of breath.   Cardiovascular:  Negative for chest pain.  Gastrointestinal:  Positive for constipation. Negative for abdominal pain, diarrhea, nausea and vomiting.  Genitourinary:  Negative for dysuria.  +insomnia +dysphagia    Objective:   No results found. No results for input(s): "WBC", "HGB", "HCT", "PLT" in the last 72 hours.  No results for input(s): "NA", "K", "CL", "CO2", "GLUCOSE", "BUN", "CREATININE", "CALCIUM" in the last 72 hours.   Intake/Output Summary (Last 24 hours) at 01/07/2022 1156 Last data filed at 01/07/2022 0925 Gross per 24 hour  Intake 777 ml  Output 200 ml  Net 577 ml         Physical Exam: Vital Signs Blood pressure 137/88, pulse 90, temperature 98.2 F (36.8 C), resp. rate 16, height '6\' 1"'$  (1.854 m), weight 72.6 kg, SpO2 100 %.  Physical Exam Constitutional:      General: He is not in acute distress. BMI 21.12. Somnolent    Appearance: Normal appearance. He is not ill-appearing.  HENT:     Head: Normocephalic and atraumatic.     Right Ear: External ear normal.     Left Ear: External ear normal.     Nose: Nose normal.  Eyes:     Pupils: Pupils are equal, round, and reactive to light.  Cardiovascular:     Rate and Rhythm: Normal rate and regular rhythm.  Pulmonary:     Effort: Pulmonary effort is normal. Normal rate    Breath sounds: CTAB,  Abdominal:     General: Bowel sounds are normal.     Palpations: Abdomen is soft.     Tenderness: There is no abdominal tenderness.  Musculoskeletal:        General: No swelling.     Comments: Bilateral hallux valgus  No significant paraspinal tenderness, minimal L lat tenderness Skin:    General: Skin is  warm and dry.  Neurological:     Mental Status: He is alert and oriented to person, place, and time.     Comments: Oriented. Fair insight and awareness. Able to provide some biographical information. Severe dysarthria. Difficulty with motor planning. Left VII and X11 deficits, did not seem to be able to visually track to left or much past midline for that matter. Motor 5/5 RUE and RLE and 4+/5 LUE and LLE. No focal sensory findings.   Psychiatric:     Comments: Pleasant         Assessment/Plan: 1. Functional deficits which require 3+ hours per day of interdisciplinary therapy in a comprehensive inpatient rehab setting. Physiatrist is providing close team supervision and 24 hour management of active medical problems listed below. Physiatrist and rehab team continue to assess barriers to discharge/monitor patient progress toward functional and medical goals  Care Tool:  Bathing    Body parts bathed by patient: Right arm, Left arm, Chest, Abdomen, Front perineal area, Buttocks, Right upper leg, Left upper leg, Right lower leg, Left lower leg, Face  Bathing assist Assist Level: Contact Guard/Touching assist     Upper Body Dressing/Undressing Upper body dressing   What is the patient wearing?: Pull over shirt    Upper body assist Assist Level: Contact Guard/Touching assist    Lower Body Dressing/Undressing Lower body dressing      What is the patient wearing?: Underwear/pull up     Lower body assist Assist for lower body dressing: Contact Guard/Touching assist     Toileting Toileting    Toileting assist Assist for toileting: Contact Guard/Touching assist     Transfers Chair/bed transfer  Transfers assist     Chair/bed transfer assist level: Supervision/Verbal cueing     Locomotion Ambulation   Ambulation assist      Assist level: Supervision/Verbal cueing Assistive device: Walker-rolling Max distance: 275f   Walk 10 feet activity   Assist      Assist level: Supervision/Verbal cueing Assistive device: Walker-rolling   Walk 50 feet activity   Assist    Assist level: Supervision/Verbal cueing Assistive device: Walker-rolling    Walk 150 feet activity   Assist    Assist level: Supervision/Verbal cueing Assistive device: Walker-rolling    Walk 10 feet on uneven surface  activity   Assist     Assist level: Contact Guard/Touching assist Assistive device: Walker-platform   Wheelchair     Assist Is the patient using a wheelchair?: No             Wheelchair 50 feet with 2 turns activity    Assist            Wheelchair 150 feet activity     Assist          Blood pressure 137/88, pulse 90, temperature 98.2 F (36.8 C), resp. rate 16, height '6\' 1"'$  (1.854 m), weight 72.6 kg, SpO2 100 %.  Medical Problem List and Plan: 1. 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             -patient may shower             -ELOS/Goals: 7-10 days, mod I to supervision with PT, OT, SLP  -Continue CIR with PT,OT,SLP  -Ambulating 2084f2.  Impaired mobility and ADLs: discontinue Lovenox given ambulation 400 feet             -antiplatelet therapy: Continue Plavix 75 mg monotherapy 3. Low back pain: continue Lidocaine patch. Tylenol as needed, Robaxin as needed 4. Insomnia: scheduled melatonin at 7pm, increase trazodone to 150 mg at 7pm.  5. Neuropsych/cognition: This patient is not quite capable of making decisions on his own behalf.             -use telesitter for safety 6. Skin/Wound Care: Routine skin care checks 7. Fluids/Electrolytes/Nutrition: routine Is and Os and recheck chemistries 8: Hypertension: ? home meds losartan, potassium supplement -"permissive HTN" post-CVA -norvasc 2.'5mg'$  initiated -6/17 norvasc increased to '5mg'$  daily 9:  Hyperlipidemia: continue atorvastatin 10: Tobacco use: cessation counseling; continue nicotine patch 11: Alcohol use: cessation counseling 12: THC use: advised of stroke risk 13: Obesity: dietary consultation 14: H. pylori associated peptic ulcer disease: status post repeat EGD 12/09/2021 with no bleeding.  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 Constipation: continue prn miralax.   LOS: 6 days A FACE TO FACE EVALUATION WAS PERFORMED  Chad Santos 01/07/2022, 11:56 AM

## 2022-01-07 NOTE — Progress Notes (Signed)
Speech Language Pathology Discharge Summary  Patient Details  Name: Chad Santos MRN: [id number] Date of Birth: Aug 12, 1960  Today's Date: 01/07/2022 SLP Individual Time: 3276-1470 Total Time: 60 minutes    Skilled Therapeutic Interventions:   Pt seen this date for skilled ST intervention targeting deglutition and cognitive-linguistic goals outlined in care plan. Pt encountered awake/alert and OOB in w/c. No c/o pain. Agreeable to ST intervention. SLP facilitated today's session by providing re-assessment of cognitive skills via administration of the Mini-Mental State Examination (MMSE), in additoin to providing skilled observation and education re: current diet consistencies. Pt scored 23/30 on MMSE, which indicates mild cognitive impairment (n = 25 or above). Results were reviewed with pt with recommendation for 24/7 supervision/assistance at time of discharge. Additionally, pt self-administered Dysphagia 2 diet textures and thin liquid via cup with Mod I and minimal, clinical s/sx concerning for pharyngeal dysphagia. Benefited from Sup A with intermittent Min verbal cues for implementation of safe swallowing strategies to include slow rate, single bites/sips, and checking for oral pocketing in L cheek with use of table top mirror. Subtle cough noted x 2 when SLP was providing education re: recommended diet textures for safe intake at home (handout of Dysphagia 1 and 2 textures provided); cough appeared more related to distractibility and cognitive deficits vs pharyngeal dysphagia. Education completed regarding above mentioned information with pt's sister, Chad Santos, who will be taking care of pt at time of discharge; she verbalized understanding and asked appropriate questions re: appropriate foods for pt's current oral deficits. Speech intelligibility continues to be reduced to 50% and below; therefore, benefits from Min-Mod verbal A for awareness of dysarthria and use of speech intelligibility  strategies.  Pt left in w/c with all safety measures activated. Call bell reviewed and within reach and all immediate needs met. Will plan for discharge this date due to upcoming hospital discharge scheduled for 6/19.  Patient has met 6 of 6 long term goals.  Patient to discharge at overall Supervision;Min level.  Reasons goals not met: N/A   Clinical Impression/Discharge Summary: Pt has demonstrated functional gains, during CIR admission, as evident by meeting 6 out of 6 long-term goals set at time of initial evaluation. At time of discharge, pt continues to present with mild cognitive impairment, moderate-severe dysarthria, and mild-moderate oral dysphagia. Will benefit from [place] intervention to continue with texture progression, improve speech intelligibility + cognitive-linguistic skills, in an effort to maximize pt's independence and decrease caregiver burden. Additionally, recommend 24/7 supervision and assistance for safe oral intake and completion of iADL tasks. Pt and family education completed.  Care Partner:  Caregiver Able to Provide Assistance: Yes  Type of Caregiver Assistance: Physical;Cognitive  Recommendation:  Home Health SLP;Outpatient SLP;24 hour supervision/assistance  Rationale for SLP Follow Up: Maximize functional communication;Maximize cognitive function and independence;Maximize swallowing safety;Reduce caregiver burden   Equipment: N/A   Reasons for discharge: Discharged from hospital   Patient/Family Agrees with Progress Made and Goals Achieved: Yes    Jamee Keach A Kaileb Monsanto 01/08/2022, 11:21 AM

## 2022-01-08 DIAGNOSIS — R1312 Dysphagia, oropharyngeal phase: Secondary | ICD-10-CM

## 2022-01-08 NOTE — Plan of Care (Signed)
  Problem: RH Swallowing Goal: LTG Patient will consume least restrictive diet using compensatory strategies with assistance (SLP) Description: LTG:  Patient will consume least restrictive diet using compensatory strategies with assistance (SLP) Outcome: Completed/Met Flowsheets (Taken 01/02/2022 1444 by Charolett Bumpers, CCC-SLP) LTG: Pt Patient will consume least restrictive diet using compensatory strategies with assistance of (SLP): Supervision Goal: LTG Patient will participate in dysphagia therapy to increase swallow function with assistance (SLP) Description: LTG:  Patient will participate in dysphagia therapy to increase swallow function with assistance (SLP) Outcome: Completed/Met Flowsheets (Taken 01/08/2022 1124) LTG: Pt will participate in dysphagia therapy to increase swallow function with assistance of (SLP): Supervision   Problem: RH Expression Communication Goal: LTG Patient will increase speech intelligibility (SLP) Description: LTG: Patient will increase speech intelligibility at word/phrase/conversation level with cues, % of the time (SLP) Outcome: Completed/Met Flowsheets Taken 01/08/2022 1124 by Emmett Bracknell A, CCC-SLP LTG: Patient will increase speech intelligibility (SLP): Moderate Assistance - Patient 50 - 74% Taken 01/02/2022 1441 by Charolett Bumpers, CCC-SLP Level: Phrase Percent of time patient will use intelligible speech: 60%   Problem: RH Problem Solving Goal: LTG Patient will demonstrate problem solving for (SLP) Description: LTG:  Patient will demonstrate problem solving for basic/complex daily situations with cues  (SLP) Outcome: Completed/Met Flowsheets Taken 01/08/2022 1124 by Helaine Chess A, CCC-SLP LTG Patient will demonstrate problem solving for: Minimal Assistance - Patient > 75% Taken 01/02/2022 1441 by Charolett Bumpers, CCC-SLP LTG: Patient will demonstrate problem solving for (SLP): (mildly complex)  Basic daily situations  Other (comment)    Problem: RH Memory Goal: LTG Patient will use memory compensatory aids to (SLP) Description: LTG:  Patient will use memory compensatory aids to recall biographical/new, daily complex information with cues (SLP) Outcome: Completed/Met Flowsheets (Taken 01/02/2022 1441 by Charolett Bumpers, [place]) LTG: Patient will use memory compensatory aids to (SLP): Minimal Assistance - Patient > 75%   Problem: RH Attention Goal: LTG Patient will demonstrate this level of attention during functional activites (SLP) Description: LTG:  Patient will will demonstrate this level of attention during functional activites (SLP) Outcome: Completed/Met Flowsheets Taken 01/08/2022 1124 by Helaine Chess A, CCC-SLP Patient will demonstrate this level of attention during cognitive/linguistic activities in: Controlled LTG: Patient will demonstrate this level of attention during cognitive/linguistic activities with assistance of (SLP): Supervision Taken 01/02/2022 1441 by Charolett Bumpers, [place] Patient will demonstrate during cognitive/linguistic activities the attention type of: Sustained Number of minutes patient will demonstrate attention during cognitive/linguistic activities: 5-10

## 2022-01-08 NOTE — Progress Notes (Signed)
Physical Therapy Session Note  Patient Details  Name: Chad Santos MRN: [id number] Date of Birth: 1960/10/08  Today's Date: 01/08/2022 PT Individual Time: 1115-1208 PT Individual Time Calculation (min): 53 min   Short Term Goals: Week 1:  PT Short Term Goal 1 (Week 1): STG = LTG due to ELOS  Skilled Therapeutic Interventions/Progress Updates:    Pt received seated in w/c in room, agreeable to PT session. No complaints of pain. Sit to stand and transfers with RW and Supervision throughout session. Car transfer with RW and CGA for some balance. [name], patient demonstrates increased fall risk as noted by score of  41/56 on Berg Balance Scale.  (<36= high risk for falls, close to 100%; 37-45 significant >80%; 46-51 moderate >50%; 52-55 lower >25%). Reviewed score and functional implications. Also assessed TUG, pt scores 34.2 sec with high fall risk. Pt with some difficulty with task due to L visual field cut and inability to accurately follow cues/directions at times. Also re-assessed 6MWT, pt scores 470 ft with RW at Supervision level. Pt with very slow gait speed that decreases with onset of fatigue. Toilet transfer with RW and Supervision. Pt left seated in w/c in room with needs in reach, quick release belt and chair alarm in place at end of session.  Therapy Documentation Precautions:  Precautions Precautions: Fall Precaution Comments: R gaze with L visual field cut. Mild R hemi Restrictions Weight Bearing Restrictions: No       Therapy/Group: Individual Therapy   Excell Seltzer, PT, DPT, CSRS 01/08/2022, 12:53 PM

## 2022-01-08 NOTE — Progress Notes (Signed)
Inpatient Rehabilitation Discharge Medication Review by a Pharmacist  A complete drug regimen review was completed for this patient to identify any potential clinically significant medication issues.  High Risk Drug Classes Is patient taking? Indication by Medication  Antipsychotic Yes Compazine- PRN nausea   Anticoagulant No   Antibiotic No   Opioid No   Antiplatelet Yes Plavix - stroke ppx   Hypoglycemics/insulin No   Vasoactive Medication Yes Amlodipine- HTN   Chemotherapy No   Other Yes Trazodone- sleep  Xanax- PRN agitation/anxiety  Robaxin - PRN muscle spasm  Protonix BID- h/o GIB     Type of Medication Issue Identified Description of Issue Recommendation(s)  Drug Interaction(s) (clinically significant)     Duplicate Therapy     Allergy     No Medication Administration End Date     Incorrect Dose     Additional Drug Therapy Needed  Tobacco use- started nicotine patch Consider discharging pt with nicotine patch to promote smoking cessation   Significant med changes from prior encounter (inform family/care partners about these prior to discharge). Amlodipine increased from 2.5 mg to 5 mg this encounter  Please reflect changes on AVS and educate patient/family if being discharged on 5 mg daily   Other       Clinically significant medication issues were identified that warrant physician communication and completion of prescribed/recommended actions by midnight of the next day:  No   Pharmacist comments:   Time spent performing this drug regimen review (minutes):  [place] 01/08/2022 11:04 AM

## 2022-01-08 NOTE — Progress Notes (Signed)
Occupational Therapy Discharge Summary  Patient Details  Name: Chad Santos MRN: [id number] Date of Birth: 08-Jun-1961  Today's Date: 01/08/2022 OT Individual Time: 0800-0900 OT Individual Time Calculation (min): 60 min    Patient has met 9 of 10 long term goals due to improved activity tolerance, improved balance, postural control, ability to compensate for deficits, functional use of  LEFT upper and LEFT lower extremity, improved attention, improved awareness, and improved coordination.  Patient to discharge at overall Supervision level.  Patient's care partner is independent to provide the necessary cognitive assistance at discharge.    Skilled Intervention:  Pt semi recliner requesting "Can we go outside".  Supine to sit and sit to stand with mod I at RW and ambulated to bathroom with supervision.  Toilet transfer and toileting completed with supervision.  Continent of urine.  Changed brief with setup and supervision to scan and attend to LLE while threading over foot.  Ambulated to sink using RW and brushed teeth and donned shirt and pants with supervision.Transported to atrium porch and completed 3 x 12 heel raises, blocked practice sit<>stand with Vcs for anterior weight shifting, and ambulation with obstacle maneuvering component with supervision at RW level.  Returned to room, call bell in reach,seat alarm on.  Reasons goals not met: Pt requires supervision to complete toilet transfer and goal was set to mod I.    Recommendation:  Patient will benefit from ongoing skilled OT services in home health setting to continue to advance functional skills in the area of BADL and iADL.  Equipment: Transfer bench and BSC  Reasons for discharge: treatment goals met and discharge from hospital  Patient/family agrees with progress made and goals achieved: Yes  OT Discharge Precautions/Restrictions  Precautions Precautions: Fall Precaution Comments: R gaze with L visual field cut. Mild R  hemi Restrictions Weight Bearing Restrictions: No Pain  None ADL ADL Eating: Supervision/safety Where Assessed-Eating: Wheelchair Grooming: Supervision/safety Where Assessed-Grooming: Standing at sink Upper Body Bathing: Supervision/safety Where Assessed-Upper Body Bathing: Shower Lower Body Bathing: Supervision/safety Where Assessed-Lower Body Bathing: Shower Upper Body Dressing: Setup Where Assessed-Upper Body Dressing: Wheelchair Lower Body Dressing: Supervision/safety Where Assessed-Lower Body Dressing: Sitting at sink, Standing at sink Toileting: Supervision/safety Where Assessed-Toileting: Bedside Commode Toilet Transfer: Therapist, music Method: Counselling psychologist: Geophysical data processor: Curator Method: Heritage manager: Radio broadcast assistant Vision Eye Alignment: Impaired (comment) Ocular Range of Motion: Restricted on the left;Impaired-to be further tested in functional context Tracking/Visual Pursuits: Decreased smoothness of vertical tracking;Decreased smoothness of horizontal tracking Saccades: Impaired - to be further tested in functional context;Overshoots Convergence: Impaired (comment) Perception  Perception: Impaired Inattention/Neglect: Does not attend to left visual field;Does not attend to left side of body Praxis Praxis: Intact Cognition Cognition Overall Cognitive Status: Impaired/Different from baseline Arousal/Alertness: Awake/alert Orientation Level: Person;Place;Situation Person: Oriented Place: Oriented Situation: Oriented Memory: Impaired Memory Impairment: Decreased recall of new information;Storage deficit Decreased Short Term Memory: Verbal basic;Functional basic Attention: Sustained Focused Attention: Appears intact Sustained Attention: Impaired Sustained Attention Impairment: Verbal complex;Functional complex Selective Attention:  Impaired Selective Attention Impairment: Verbal basic;Functional basic Awareness: Impaired Awareness Impairment: Emergent impairment;Anticipatory impairment Problem Solving: Impaired Problem Solving Impairment: Functional basic;Verbal complex Executive Function: Self Correcting Reasoning: Impaired Reasoning Impairment: Functional basic;Verbal basic Self Monitoring: Impaired Self Monitoring Impairment: Functional basic;Verbal basic Self Correcting: Impaired Self Correcting Impairment: Functional basic Behaviors: Impulsive Safety/Judgment: Impaired Brief Interview for Mental Status (BIMS) Repetition of Three Words (First Attempt): 3 Temporal Orientation: Year:  Correct Temporal Orientation: Month: Accurate within 5 days Temporal Orientation: Day: Correct Recall: "Sock": Yes, no cue required Recall: "Blue": Yes, no cue required Recall: "Bed": Yes, after cueing ("a piece of furniture") BIMS Summary Score: 14 Sensation Sensation Light Touch: Appears Intact Hot/Cold: Appears Intact Proprioception: Impaired by gross assessment Stereognosis: Not tested Coordination Gross Motor Movements are Fluid and Coordinated: No Fine Motor Movements are Fluid and Coordinated: No Finger Nose Finger Test: decreased perception Motor  Motor Motor: Hemiplegia Motor - Discharge Observations: mild L hemi Mobility  Bed Mobility Bed Mobility: Sit to Supine;Supine to Sit Supine to Sit: Independent Sit to Supine: Independent Transfers Sit to Stand: Supervision/Verbal cueing Stand to Sit: Supervision/Verbal cueing  Trunk/Postural Assessment  Cervical Assessment Cervical Assessment: Within Functional Limits Thoracic Assessment Thoracic Assessment: Exceptions to [facility] (rounded shoulders) Lumbar Assessment Lumbar Assessment: Exceptions to [facility] (posterior pelvic tilt) Postural Control Postural Control: Deficits on evaluation Righting Reactions: mild delay  Balance Balance Balance Assessed:  Yes Standardized Balance Assessment Standardized Balance Assessment: (P) Berg Balance Test;Timed Up and Go Test Berg Balance Test Sit to Stand: (P) Able to stand without using hands and stabilize independently Standing Unsupported: (P) Able to stand 2 minutes with supervision Sitting with Back Unsupported but Feet Supported on Floor or Stool: (P) Able to sit safely and securely 2 minutes Stand to Sit: (P) Sits safely with minimal use of hands Transfers: (P) Able to transfer safely, minor use of hands Standing Unsupported with Eyes Closed: (P) Able to stand 10 seconds safely Standing Ubsupported with Feet Together: (P) Able to place feet together independently and stand for 1 minute with supervision From Standing, Reach Forward with Outstretched Arm: (P) Can reach forward >5 cm safely (2") From Standing Position, Pick up Object from Floor: (P) Able to pick up shoe, needs supervision From Standing Position, Turn to Look Behind Over each Shoulder: (P) Looks behind one side only/other side shows less weight shift Turn 360 Degrees: (P) Able to turn 360 degrees safely but slowly Standing Unsupported, Alternately Place Feet on Step/Stool: (P) Needs assistance to keep from falling or unable to try Standing Unsupported, One Foot in Front: (P) Able to plae foot ahead of the other independently and hold 30 seconds Standing on One Leg: (P) Able to lift leg independently and hold equal to or more than 3 seconds Total Score: (P) 41 Static Sitting Balance Static Sitting - Balance Support: Feet supported;No upper extremity supported Static Sitting - Level of Assistance: 7: Independent Dynamic Sitting Balance Dynamic Sitting - Balance Support: Feet supported;No upper extremity supported;During functional activity Dynamic Sitting - Level of Assistance: 5: Stand by assistance Static Standing Balance Static Standing - Balance Support: Bilateral upper extremity supported Static Standing - Level of Assistance:  5: Stand by assistance Dynamic Standing Balance Dynamic Standing - Balance Support: During functional activity;Bilateral upper extremity supported Dynamic Standing - Level of Assistance: 5: Stand by assistance Extremity/Trunk Assessment RUE Assessment RUE Assessment: Within Functional Limits LUE Assessment LUE Assessment: Exceptions to [facility] General Strength Comments: 4-/5   Ezekiel Slocumb 01/08/2022, 12:16 PM

## 2022-01-08 NOTE — Progress Notes (Signed)
PROGRESS NOTE   Subjective/Complaints: Sitting in his chair this AM. Reports he has no concerns today.   Review of Systems  Constitutional:  Negative for chills and fever.  HENT:  Negative for congestion.   Respiratory:  Negative for shortness of breath.   Cardiovascular:  Negative for chest pain and palpitations.  Gastrointestinal:  Negative for abdominal pain, diarrhea, nausea and vomiting.  Genitourinary:  Negative for dysuria.  +insomnia +dysphagia    Objective:   No results found. No results for input(s): "WBC", "HGB", "HCT", "PLT" in the last 72 hours.  No results for input(s): "NA", "K", "CL", "CO2", "GLUCOSE", "BUN", "CREATININE", "CALCIUM" in the last 72 hours.   Intake/Output Summary (Last 24 hours) at 01/08/2022 1248 Last data filed at 01/08/2022 1240 Gross per 24 hour  Intake 880 ml  Output --  Net 880 ml         Physical Exam: Vital Signs Blood pressure (!) 151/90, pulse 77, temperature 98.1 F (36.7 C), temperature source Oral, resp. rate 18, height '6\' 1"'$  (1.854 m), weight 72.6 kg, SpO2 100 %.  Physical Exam Constitutional:      General: He is not in acute distress. BMI 21.12. alert sitting in chair    Appearance: Normal appearance. He is not ill-appearing.  HENT:     Head: Normocephalic and atraumatic.     Right Ear: External ear normal.     Left Ear: External ear normal.     Nose: Nose normal.  Eyes:     Pupils: Pupils are equal, round, and reactive to light.  Cardiovascular:     Rate and Rhythm: Normal rate and regular rhythm.  Pulmonary:     Effort: Pulmonary effort is normal. Normal rate    Breath sounds: CTAB,  Abdominal:     General: Bowel sounds are normal.     Palpations: Abdomen is soft.     Tenderness: There is no abdominal tenderness.  Not tender Musculoskeletal:        General: No swelling.     Comments: Bilateral hallux valgus  No significant paraspinal tenderness,  minimal L lat tenderness Skin:    General: Skin is warm and dry.  Neurological:     Mental Status: He is alert and oriented to person, place, and time.     Comments: Oriented. Fair insight and awareness. Able to provide some biographical information. Severe dysarthria. Difficulty with motor planning. Left VII and X11 deficits, did not seem to be able to visually track to left or much past midline for that matter. Motor 5/5 RUE and RLE and 4+/5 LUE and LLE. No focal sensory findings.   Psychiatric:     Comments: Pleasant         Assessment/Plan: 1. Functional deficits which require 3+ hours per day of interdisciplinary therapy in a comprehensive inpatient rehab setting. Physiatrist is providing close team supervision and 24 hour management of active medical problems listed below. Physiatrist and rehab team continue to assess barriers to discharge/monitor patient progress toward functional and medical goals  Care Tool:  Bathing    Body parts bathed by patient: Right arm, Left arm, Chest, Abdomen, Front perineal area, Buttocks, Right upper leg, Left  upper leg, Right lower leg, Left lower leg, Face         Bathing assist Assist Level: Supervision/Verbal cueing     Upper Body Dressing/Undressing Upper body dressing   What is the patient wearing?: Pull over shirt    Upper body assist Assist Level: Set up assist    Lower Body Dressing/Undressing Lower body dressing      What is the patient wearing?: Underwear/pull up     Lower body assist Assist for lower body dressing: Supervision/Verbal cueing     Toileting Toileting    Toileting assist Assist for toileting: Supervision/Verbal cueing     Transfers Chair/bed transfer  Transfers assist     Chair/bed transfer assist level: Supervision/Verbal cueing     Locomotion Ambulation   Ambulation assist      Assist level: Supervision/Verbal cueing Assistive device: Walker-rolling Max distance: 256f   Walk 10  feet activity   Assist     Assist level: Supervision/Verbal cueing Assistive device: Walker-rolling   Walk 50 feet activity   Assist    Assist level: Supervision/Verbal cueing Assistive device: Walker-rolling    Walk 150 feet activity   Assist    Assist level: Supervision/Verbal cueing Assistive device: Walker-rolling    Walk 10 feet on uneven surface  activity   Assist     Assist level: Contact Guard/Touching assist Assistive device: Walker-platform   Wheelchair     Assist Is the patient using a wheelchair?: No             Wheelchair 50 feet with 2 turns activity    Assist            Wheelchair 150 feet activity     Assist          Blood pressure (!) 151/90, pulse 77, temperature 98.1 F (36.7 C), temperature source Oral, resp. rate 18, height '6\' 1"'$  (1.854 m), weight 72.6 kg, SpO2 100 %.  Medical Problem List and Plan: 1. 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             -patient may shower             -ELOS/Goals: 7-10 days, mod I to supervision with PT, OT, SLP  -Continue CIR with PT,OT,SLP  -Ambulating 2069f -Plan for DC tomorrow 2.  Impaired mobility and ADLs: discontinue Lovenox given ambulation 400 feet             -antiplatelet therapy: Continue Plavix 75 mg monotherapy 3. Low back pain: continue Lidocaine patch. Tylenol as needed, Robaxin as needed 4. Insomnia: scheduled melatonin at 7pm, increase trazodone to 150 mg at 7pm.  5. Neuropsych/cognition: This patient is not quite capable of making decisions on his own behalf.             -use telesitter for safety 6. Skin/Wound Care: Routine skin care checks 7. Fluids/Electrolytes/Nutrition: routine Is and Os and recheck chemistries 8: Hypertension: ? home meds losartan, potassium supplement -"permissive HTN"  post-CVA -norvasc 2.'5mg'$  initiated -6/17 norvasc increased to '5mg'$  daily -6/18 BP a little better, follow trend 9: Hyperlipidemia: continue atorvastatin 10: Tobacco use: cessation counseling; continue nicotine patch 11: Alcohol use: cessation counseling 12: THC use: advised of stroke risk 13: Obesity: dietary consultation 14: H. pylori associated peptic ulcer disease: status post repeat EGD 12/09/2021 with no bleeding.  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 Constipation: continue prn miralax.   -BM yesterday, improved  LOS: 7 days A FACE TO FACE EVALUATION WAS PERFORMED  Jennye Boroughs 01/08/2022, 12:48 PM

## 2022-01-09 LAB — CBC
HCT: 37.6 % — ABNORMAL LOW (ref 39.0–52.0)
Hemoglobin: 12.6 g/dL — ABNORMAL LOW (ref 13.0–17.0)
MCH: 29.6 pg (ref 26.0–34.0)
MCHC: 33.5 g/dL (ref 30.0–36.0)
MCV: 88.3 fL (ref 80.0–100.0)
Platelets: 255 10*3/uL (ref 150–400)
RBC: 4.26 MIL/uL (ref 4.22–5.81)
RDW: 13.6 % (ref 11.5–15.5)
WBC: 7.1 10*3/uL (ref 4.0–10.5)
nRBC: 0 % (ref 0.0–0.2)

## 2022-01-09 LAB — BASIC METABOLIC PANEL
Anion gap: 7 (ref 5–15)
BUN: 20 mg/dL (ref 8–23)
CO2: 26 mmol/L (ref 22–32)
Calcium: 9.3 mg/dL (ref 8.9–10.3)
Chloride: 106 mmol/L (ref 98–111)
Creatinine, Ser: 1.31 mg/dL — ABNORMAL HIGH (ref 0.61–1.24)
GFR, Estimated: >60 mL/min (ref >60)
Glucose, Bld: 96 mg/dL (ref 70–99)
Potassium: 4 mmol/L (ref 3.5–5.1)
Sodium: 139 mmol/L (ref 135–145)

## 2022-01-09 MED ORDER — METHOCARBAMOL 500 MG PO TABS: 500.0000 mg | ORAL_TABLET | Freq: Four times a day (QID) | ORAL | 0 refills | Status: DC | PRN

## 2022-01-09 MED ORDER — TRAZODONE HCL 150 MG PO TABS: 150.0000 mg | ORAL_TABLET | Freq: Every evening | ORAL | 0 refills | Status: DC | PRN

## 2022-01-09 MED ORDER — ALPRAZOLAM 0.5 MG PO TABS: 0.5000 mg | ORAL_TABLET | Freq: Two times a day (BID) | ORAL | 0 refills | Status: DC | PRN

## 2022-01-09 MED ORDER — NICOTINE 21 MG/24HR TD PT24
21.0000 mg | MEDICATED_PATCH | Freq: Every day | TRANSDERMAL | 0 refills | Status: DC
Start: 2022-01-10 — End: 2022-05-08

## 2022-01-09 MED ORDER — CLOPIDOGREL BISULFATE 75 MG PO TABS
75.0000 mg | ORAL_TABLET | Freq: Every day | ORAL | 0 refills | Status: AC
Start: 2022-01-09 — End: ?

## 2022-01-09 MED ORDER — PANTOPRAZOLE SODIUM 40 MG PO TBEC: 40.0000 mg | DELAYED_RELEASE_TABLET | Freq: Two times a day (BID) | ORAL | 3 refills | Status: DC

## 2022-01-09 MED ORDER — AMLODIPINE BESYLATE 5 MG PO TABS: 5.0000 mg | ORAL_TABLET | Freq: Every day | ORAL | 0 refills | Status: DC

## 2022-01-09 MED ORDER — ATORVASTATIN CALCIUM 80 MG PO TABS: 80.0000 mg | ORAL_TABLET | Freq: Every day | ORAL | 0 refills | Status: AC

## 2022-01-09 MED ORDER — ACETAMINOPHEN 325 MG PO TABS: 325.0000 mg | ORAL_TABLET | ORAL | Status: DC | PRN

## 2022-01-09 MED ORDER — BACITRACIN ZINC 500 UNIT/GM EX OINT: 1.0000 | TOPICAL_OINTMENT | Freq: Two times a day (BID) | CUTANEOUS | 0 refills | Status: AC

## 2022-01-09 NOTE — Progress Notes (Signed)
Inpatient Rehabilitation Care Coordinator Discharge Note   Patient Details  Name: Chad Santos MRN: [id number] Date of Birth: 10/29/1960   Discharge location: Home with sister who is aware of his care needs  Length of Stay: 8 days  Discharge activity level: supervision-mod/i level  Home/community participation: active  Patient response TD:HRCBUL Literacy - How often do you need to have someone help you when you read instructions, pamphlets, or other written material from your doctor or pharmacy?: Sometimes  Patient response AG:TXMIWO Isolation - How often do you feel lonely or isolated from those around you?: Never  Services provided included: MD, RD, PT, OT, SLP, RN, CM, Pharmacy, SW  Financial Services:  Financial Services Utilized: Medicaid    Choices offered to/list presented to: pt and sister  Follow-up services arranged:  [place], DME, Patient/Family has no preference for HH/DME agencies [place]: [facility]  PT, OT, [place]    DME : [employer] 3 in 1    Patient response to transportation need: Is the patient able to respond to transportation needs?: Yes In the past 12 months, has lack of transportation kept you from medical appointments or from getting medications?: No In the past 12 months, has lack of transportation kept you from meetings, work, or from getting things needed for daily living?: No    Comments (or additional information):sister is aware of brother's care needs and comfortable with his care. Pt's feels ready for discharge today  Patient/Family verbalized understanding of follow-up arrangements:  Yes  Individual responsible for coordination of the follow-up plan: [name]-sister [phone]  Confirmed correct DME delivered: Elease Hashimoto 01/09/2022    Mekala Winger, Gardiner Rhyme

## 2022-01-09 NOTE — Progress Notes (Signed)
PROGRESS NOTE   Subjective/Complaints: He has no new complaints Eager to leave today Sister is on her way Medications and follow-ups discussed  Review of Systems  Constitutional:  Negative for chills and fever.  HENT:  Negative for congestion.   Respiratory:  Negative for shortness of breath.   Cardiovascular:  Negative for chest pain and palpitations.  Gastrointestinal:  Negative for abdominal pain, diarrhea, nausea and vomiting.  Genitourinary:  Negative for dysuria.  +insomnia +dysphagia    Objective:   No results found. Recent Labs    01/09/22 0552  WBC 7.1  HGB 12.6*  HCT 37.6*  PLT 255    Recent Labs    01/09/22 0552  NA 139  K 4.0  CL 106  CO2 26  GLUCOSE 96  BUN 20  CREATININE 1.31*  CALCIUM 9.3     Intake/Output Summary (Last 24 hours) at 01/09/2022 0828 Last data filed at 01/08/2022 2345 Gross per 24 hour  Intake 720 ml  Output 325 ml  Net 395 ml         Physical Exam: Vital Signs Blood pressure (!) 148/79, pulse 85, temperature 97.8 F (36.6 C), resp. rate 20, height '6\' 1"'$  (1.854 m), weight 72.6 kg, SpO2 100 %.  Physical Exam Constitutional:      General: He is not in acute distress. BMI 21.12. alert sitting in chair    Appearance: Normal appearance. He is not ill-appearing.  HENT:     Head: Normocephalic and atraumatic.     Right Ear: External ear normal.     Left Ear: External ear normal.     Nose: Nose normal.  Eyes:     Pupils: Pupils are equal, round, and reactive to light.  Cardiovascular:     Rate and Rhythm: Normal rate and regular rhythm.  Pulmonary:     Effort: Pulmonary effort is normal. Normal rate    Breath sounds: CTAB,  Abdominal:     General: Bowel sounds are normal.     Palpations: Abdomen is soft.     Tenderness: There is no abdominal tenderness.  Not tender Musculoskeletal:        General: No swelling.     Comments: Bilateral hallux valgus  No  significant paraspinal tenderness, minimal L lat tenderness Skin:    General: Skin is warm and dry.  Neurological:     Mental Status: He is alert and oriented to person, place, and time.     Comments: Oriented. Fair insight and awareness. Able to provide some biographical information. Severe dysarthria. Difficulty with motor planning. Left VII and X11 deficits, did not seem to be able to visually track to left or much past midline for that matter. Motor 5/5 RUE and RLE and 4+/5 LUE and LLE. No focal sensory findings.  Stable strength and sensation Psychiatric:     Comments: Pleasant   Assessment/Plan: 1. Functional deficits which require 3+ hours per day of interdisciplinary therapy in a comprehensive inpatient rehab setting. Physiatrist is providing close team supervision and 24 hour management of active medical problems listed below. Physiatrist and rehab team continue to assess barriers to discharge/monitor patient progress toward functional and medical goals  Care Tool:  Bathing    Body parts bathed by patient: Right arm, Left arm, Chest, Abdomen, Front perineal area, Buttocks, Right upper leg, Left upper leg, Right lower leg, Left lower leg, Face         Bathing assist Assist Level: Supervision/Verbal cueing     Upper Body Dressing/Undressing Upper body dressing   What is the patient wearing?: Pull over shirt    Upper body assist Assist Level: Set up assist    Lower Body Dressing/Undressing Lower body dressing      What is the patient wearing?: Underwear/pull up     Lower body assist Assist for lower body dressing: Supervision/Verbal cueing     Toileting Toileting    Toileting assist Assist for toileting: Supervision/Verbal cueing     Transfers Chair/bed transfer  Transfers assist     Chair/bed transfer assist level: Supervision/Verbal cueing     Locomotion Ambulation   Ambulation assist      Assist level: Supervision/Verbal cueing Assistive  device: Walker-rolling Max distance: 259f   Walk 10 feet activity   Assist     Assist level: Supervision/Verbal cueing Assistive device: Walker-rolling   Walk 50 feet activity   Assist    Assist level: Supervision/Verbal cueing Assistive device: Walker-rolling    Walk 150 feet activity   Assist    Assist level: Supervision/Verbal cueing Assistive device: Walker-rolling    Walk 10 feet on uneven surface  activity   Assist     Assist level: Contact Guard/Touching assist Assistive device: Walker-rolling   Wheelchair     Assist Is the patient using a wheelchair?: No             Wheelchair 50 feet with 2 turns activity    Assist            Wheelchair 150 feet activity     Assist          Blood pressure (!) 148/79, pulse 85, temperature 97.8 F (36.6 C), resp. rate 20, height '6\' 1"'$  (1.854 m), weight 72.6 kg, SpO2 100 %.  Medical Problem List and Plan: 1. 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             -patient may shower             -ELOS/Goals: 7-10 days, mod I to supervision with PT, OT, SLP  -d/c home  -Ambulating 2033f -Plan for DC tomorrow 2.  Impaired mobility and ADLs: discontinue Lovenox given ambulation 400 feet             -antiplatelet therapy: Continue Plavix 75 mg monotherapy 3. Low back pain: continue Lidocaine patch. Tylenol as needed, Robaxin as needed 4. Insomnia: scheduled melatonin at 7pm, increase trazodone to 150 mg at 7pm.  5. Neuropsych/cognition: This patient is not quite capable of making decisions on his own behalf.             -use telesitter for safety 6. Skin/Wound Care: Routine skin care checks 7. Fluids/Electrolytes/Nutrition: routine Is and Os and recheck chemistries 8: Hypertension: ? home meds losartan, potassium supplement -"permissive  HTN" post-CVA -norvasc 2.'5mg'$  initiated -6/17 norvasc increased to '5mg'$  daily, continue that dose for home.  -6/18 BP a little better, follow trend 9: Hyperlipidemia: continue atorvastatin 10: Tobacco use: cessation counseling; continue nicotine patch 11: Alcohol use: cessation counseling 12: THC use: advised of stroke risk 13:  Obesity: dietary consultation 14: H. pylori associated peptic ulcer disease: status post repeat EGD 12/09/2021 with no bleeding.  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 Constipation: continue prn miralax.   -BM yesterday, improved   >30 minutes spent in discharge of patient including review of medications and follow-up appointments, physical examination, and in answering all patient's questions   LOS: 8 days A FACE TO FACE EVALUATION WAS [place] 01/09/2022, 8:28 AM

## 2022-01-20 ENCOUNTER — Ambulatory Visit (INDEPENDENT_AMBULATORY_CARE_PROVIDER_SITE_OTHER): Payer: Medicaid Other | Admitting: Gastroenterology

## 2022-01-20 ENCOUNTER — Encounter: Payer: Medicaid Other | Attending: Physical Medicine & Rehabilitation | Admitting: Physical Medicine & Rehabilitation

## 2022-01-20 ENCOUNTER — Encounter: Payer: Self-pay | Admitting: Gastroenterology

## 2022-01-20 VITALS — BP 126/70 | HR 95 | Ht 73.0 in | Wt 153.8 lb

## 2022-01-20 DIAGNOSIS — D509 Iron deficiency anemia, unspecified: Secondary | ICD-10-CM | POA: Diagnosis not present

## 2022-01-20 DIAGNOSIS — K2270 Barrett's esophagus without dysplasia: Secondary | ICD-10-CM | POA: Diagnosis not present

## 2022-01-20 DIAGNOSIS — B9681 Helicobacter pylori [H. pylori] as the cause of diseases classified elsewhere: Secondary | ICD-10-CM | POA: Diagnosis not present

## 2022-01-20 DIAGNOSIS — K279 Peptic ulcer, site unspecified, unspecified as acute or chronic, without hemorrhage or perforation: Secondary | ICD-10-CM

## 2022-01-20 NOTE — Patient Instructions (Signed)
It was a pleasure to see you today.   I recommend that you continue to take pantoprazole 40 mg daily.  Avoid all nonsteroidal anti-inflammatory medications such as ibuprofen, Motrin, Goody's, Aleve, Advil, Goody's, and BC powder.  I recommended repeating an upper endoscopy in 2026 with biopsies to follow-up on the possible diagnosis of Barrett's esophagus.  Barrett's esophagus can be a precursor to esophageal cancer and we like to really look at the esophagus every 3 to 5 years.  Given your history of colon polyps you should have another colonoscopy in 2026.  We will remind you when your endoscopy is due in 2026.  Please let me know if you have any questions or concerns prior to that time.  Thank you.

## 2022-01-20 NOTE — Progress Notes (Signed)
Referring Provider: No ref. provider found Primary Care Physician:  Pcp, No   Chief complaint: Hospital follow-up   IMPRESSION:  History of H. Pylori-associated gastritis with associated peptic ulcer disease.  Follow-up EGD showed normal gastric biopsies.  Intestinal metaplasia seen in January was not seen on follow-up EGD.  Iron deficiency anemia.  Likely due to peptic ulcer disease.  Overall improved from January.  Continue close follow-up.  History of colon polyps.  10 mm tubular adenoma removed on colonoscopy 08/23/2021.  Surveillance recommended in 3 years.  Medium sized hiatal hernia without any associated ongoing symptoms  Possible Barrett's esophagus on esophageal biopsies 11/2021.  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  No dysplasia seen on biopsies.  PLAN: - Continue pantoprazole 40 mg daily - Continue to avoid all NSAIDs - EGD 2026 with biopsies given the possible diagnosis of Barrett's - Colonoscopy 07/2024 - Office follow-up as needed    HPI: Anders Hohmann is a 61 y.o. male who returns in follow-up.  He is unaccompanied today.  The interval history is obtained through the patient and review of his electronic health record.  He was hospitalized 08/21/2021 through 08/23/2021 for symptomatic anemia presenting with chest pain, dyspnea on exertion, and black stools.  He had an upper endoscopy and colonoscopy performed 08/23/2021 to evaluate iron deficiency anemia.  EGD with Dr. Lorenso Courier 08/23/2021 showed a medium sized hiatal hernia, a 10 mm nonbleeding Mallory-Weiss tear, and multiple cratered gastric ulcers in the antrum.  Gastric biopsies showed chronic active gastritis with intestinal metaplasia and H. pylori.  Repeat endoscopy recommended in 2 months to perform healing. H pylori treated was recommended, however, he was not aware of the recommendation and did not pick the antibiotics up from the pharmacy.   Colonoscopy with Dr.  Lorenso Courier 08/23/2021 showed a 10 mm ascending colon tubular adenoma, sigmoid diverticulosis, and internal hemorrhoids.  Subsequently completed 14 days of treatment with tetracycline, Flagyl, and bismuth subsalicylate.  Follow-up EGD 12/09/2021 showed LA class a reflux esophagitis and gastritis with a medium sized hiatal hernia.  Biopsies showed mild chronic gastritis.  There was no H. pylori or intestinal metaplasia seen.  Esophageal biopsies may suggest Barrett's esophagus.  He returns today 2 weeks after hospitalization for stroke.  He has no specific GI complaints or concerns.  No overt bleeding, He does report insomnia.   He was previously using Aleve for leg pain but stopped using all NSAIDs at the time of his ulcer diagnosis.    Labs 08/23/2021 show hemoglobin of 8.2, MCV 94.2, RDW 14.8, platelets 160. He has not seen a doctor or had labs performed since his hospitalization.   Labs 01/09/2022 show hemoglobin of 12.6, MCV 88.3, RDW 13.6  Past Medical History:  Diagnosis Date   Hypertension     Past Surgical History:  Procedure Laterality Date   BIOPSY  08/23/2021   Procedure: BIOPSY;  Surgeon: [name], MD;  Location: [facility] ENDOSCOPY;  Service: Gastroenterology;;   COLONOSCOPY WITH PROPOFOL N/A 08/23/2021   Procedure: COLONOSCOPY WITH PROPOFOL;  Surgeon: [name], MD;  Location: [place];  Service: Gastroenterology;  Laterality: N/A;   ESOPHAGOGASTRODUODENOSCOPY (EGD) WITH PROPOFOL N/A 08/23/2021   Procedure: ESOPHAGOGASTRODUODENOSCOPY (EGD) WITH PROPOFOL;  Surgeon: [name], MD;  Location: [place];  Service: Gastroenterology;  Laterality: N/A;   HAND SURGERY Right    POLYPECTOMY  08/23/2021   Procedure: POLYPECTOMY;  Surgeon: [name], MD;  Location[facility] ENDOSCOPY;  Service: Gastroenterology;;  Current Outpatient Medications  Medication Sig Dispense Refill   acetaminophen (TYLENOL) 325 MG tablet Take 1-2 tablets (325-650 mg total) by mouth every 4 (four)  hours as needed for mild pain.     ALPRAZolam (XANAX) 0.5 MG tablet Take 1 tablet (0.5 mg total) by mouth 2 (two) times daily as needed for anxiety (for agitation). 14 tablet 0   amLODipine (NORVASC) 5 MG tablet Take 1 tablet (5 mg total) by mouth daily. 30 tablet 0   atorvastatin (LIPITOR) 80 MG tablet Take 1 tablet (80 mg total) by mouth at bedtime. 30 tablet 0   bacitracin ointment Apply 1 Application topically 2 (two) times daily. 120 g 0   clopidogrel (PLAVIX) 75 MG tablet Take 1 tablet (75 mg total) by mouth daily. 30 tablet 0   methocarbamol (ROBAXIN) 500 MG tablet Take 1 tablet (500 mg total) by mouth every 6 (six) hours as needed for muscle spasms. 30 tablet 0   nicotine (NICODERM CQ - DOSED IN MG/24 HOURS) 21 mg/24hr patch Place 1 patch (21 mg total) onto the skin daily. 28 patch 0   pantoprazole (PROTONIX) 40 MG tablet Take 1 tablet (40 mg total) by mouth 2 (two) times daily. 60 tablet 3   traZODone (DESYREL) 150 MG tablet Take 1 tablet (150 mg total) by mouth at bedtime as needed for sleep. 30 tablet 0   No current facility-administered medications for this visit.    Allergies as of 01/20/2022   (No Known Allergies)    Family History  Problem Relation Age of Onset   Colon cancer Neg Hx    Esophageal cancer Neg Hx    Rectal cancer Neg Hx    Stomach cancer Neg Hx     Social History   Socioeconomic History   Marital status: Single    Spouse name: Not on file   Number of children: Not on file   Years of education: Not on file   Highest education level: Not on file  Occupational History   Occupation: unemployed  Tobacco Use   Smoking status: Every Day    Packs/day: 1.00    Years: 37.00    Total pack years: 37.00    Types: Cigarettes   Smokeless tobacco: Never   Tobacco comments:    Reports <1ppd  Vaping Use   Vaping Use: Never used  Substance and Sexual Activity   Alcohol use: Yes    Comment: occasionally,6 pack/month   Drug use: Never   Sexual activity: Not  on file  Other Topics Concern   Not on file  Social History Narrative   Not on file   Social Determinants of Health   Financial Resource Strain: Not on file  Food Insecurity: Not on file  Transportation Needs: Not on file  Physical Activity: Not on file  Stress: Not on file  Social Connections: Not on file  Intimate Partner Violence: Not on file    Review of Systems: 12 system ROS is negative except as noted above.   Physical Exam: General:   Alert,  well-nourished, pleasant and cooperative in NAD Head:  Normocephalic and atraumatic. Eyes:  Sclera clear, no icterus.   Conjunctiva pink. Ears:  Normal auditory acuity. Nose:  No deformity, discharge,  or lesions. Mouth:  No deformity or lesions.   Neck:  Supple; no masses or thyromegaly. Lungs:  Clear throughout to auscultation.   No wheezes. Heart:  Regular rate and rhythm; no murmurs. Abdomen:  Soft, nontender, nondistended, normal bowel sounds, no rebound  or guarding. No hepatosplenomegaly.   Rectal:  Deferred  Msk:  Symmetrical. No boney deformities LAD: No inguinal or umbilical LAD Extremities:  No clubbing or edema. Neurologic:  Alert and  oriented x4;  grossly nonfocal Skin:  Intact without significant lesions or rashes. Psych:  Alert and cooperative. Normal mood and affect.    Marne Meline L. Tarri Glenn, MD, MPH 01/20/2022, 12:19 PM

## 2022-01-23 ENCOUNTER — Encounter: Payer: Self-pay | Admitting: Student

## 2022-01-23 ENCOUNTER — Ambulatory Visit: Payer: Medicaid Other | Admitting: Student

## 2022-01-23 ENCOUNTER — Other Ambulatory Visit: Payer: Self-pay | Admitting: Internal Medicine

## 2022-01-23 DIAGNOSIS — F32A Depression, unspecified: Secondary | ICD-10-CM

## 2022-01-23 DIAGNOSIS — I639 Cerebral infarction, unspecified: Secondary | ICD-10-CM

## 2022-01-23 DIAGNOSIS — E538 Deficiency of other specified B group vitamins: Secondary | ICD-10-CM | POA: Diagnosis not present

## 2022-01-23 DIAGNOSIS — I959 Hypotension, unspecified: Secondary | ICD-10-CM | POA: Diagnosis not present

## 2022-01-23 DIAGNOSIS — E559 Vitamin D deficiency, unspecified: Secondary | ICD-10-CM | POA: Diagnosis not present

## 2022-01-23 DIAGNOSIS — F17210 Nicotine dependence, cigarettes, uncomplicated: Secondary | ICD-10-CM | POA: Diagnosis not present

## 2022-01-23 MED ORDER — MIRTAZAPINE 15 MG PO TABS: ORAL_TABLET | ORAL | 0 refills | Status: DC

## 2022-01-23 MED ORDER — VITAMIN D (ERGOCALCIFEROL) 1.25 MG (50000 UNIT) PO CAPS: 50000.0000 [IU] | ORAL_CAPSULE | ORAL | 0 refills | Status: DC

## 2022-01-23 NOTE — Assessment & Plan Note (Addendum)
Pt suffered a stroke of the right internal capsule on 12/27/21, and was discharged from the hospital on 01/01/22. He is able to ambulate with a  limp on the left side, and neurological exam showed good strength in both upper extremities.  He presents today with his sister who is looking for alternative care options for him, as currently she and her other sister are his only caretakers. Options discussed are nursing homes, assisted-living facilities, and the PACE program.  Pt also said he missed his last physical therapy appointment.  Plan:  -Referral to the clinic's social worker Milus Height) to help with placement for a facility  -Pt sister will communicate with PACE program  -Encouraged to keep up with Physical therapy to help with ambulation and self care  - Follow up in 2 weeks to touch base and come to a decision regarding his placement

## 2022-01-23 NOTE — Assessment & Plan Note (Addendum)
Pt's last lab for Vitamin D was on 06/21/21, and was 10.2.  Plan:  - Prescribed Vitamin D 50000 units, once a week for four weeks -Recheck Vitamin D, B12, and Hb level in two weeks on follow up appointment

## 2022-01-23 NOTE — Assessment & Plan Note (Addendum)
Pt's initial blood pressure when entering clinic was 101/57, a recheck was done an hour later and the bp was 101/66. After his hospital departure on 6/11, patient was given amlodipine 5 mg, which may have been an over-correction of his blood pressure.   Plan:  - Stopped amlodipine  - Will recheck blood pressure at follow up appt in 2 weeks.  - Recommended to take blood pressure readings at home

## 2022-01-23 NOTE — Progress Notes (Addendum)
CC: Functional Incapacity due to CVA  HPI:  Mr.Chad Santos is a 61 y.o. male living with a history stated below and presents today for functional incapacity post CVA . Please see problem based assessment and plan for additional details.  Past Medical History:  Diagnosis Date   Hypertension     Current Outpatient Medications on File Prior to Visit  Medication Sig Dispense Refill   acetaminophen (TYLENOL) 325 MG tablet Take 1-2 tablets (325-650 mg total) by mouth every 4 (four) hours as needed for mild pain.     ALPRAZolam (XANAX) 0.5 MG tablet Take 1 tablet (0.5 mg total) by mouth 2 (two) times daily as needed for anxiety (for agitation). 14 tablet 0   atorvastatin (LIPITOR) 80 MG tablet Take 1 tablet (80 mg total) by mouth at bedtime. 30 tablet 0   bacitracin ointment Apply 1 Application topically 2 (two) times daily. 120 g 0   clopidogrel (PLAVIX) 75 MG tablet Take 1 tablet (75 mg total) by mouth daily. 30 tablet 0   methocarbamol (ROBAXIN) 500 MG tablet Take 1 tablet (500 mg total) by mouth every 6 (six) hours as needed for muscle spasms. 30 tablet 0   nicotine (NICODERM CQ - DOSED IN MG/24 HOURS) 21 mg/24hr patch Place 1 patch (21 mg total) onto the skin daily. 28 patch 0   pantoprazole (PROTONIX) 40 MG tablet Take 1 tablet (40 mg total) by mouth 2 (two) times daily. 60 tablet 3   traZODone (DESYREL) 150 MG tablet Take 1 tablet (150 mg total) by mouth at bedtime as needed for sleep. 30 tablet 0   No current facility-administered medications on file prior to visit.    Family History  Problem Relation Age of Onset   Colon cancer Neg Hx    Esophageal cancer Neg Hx    Rectal cancer Neg Hx    Stomach cancer Neg Hx     Social History   Socioeconomic History   Marital status: Single    Spouse name: Not on file   Number of children: Not on file   Years of education: Not on file   Highest education level: Not on file  Occupational History   Occupation: unemployed   Tobacco Use   Smoking status: Every Day    Packs/day: 1.00    Years: 37.00    Total pack years: 37.00    Types: Cigarettes   Smokeless tobacco: Never   Tobacco comments:    Reports <1ppd  Vaping Use   Vaping Use: Never used  Substance and Sexual Activity   Alcohol use: Not Currently    Comment: occasionally,6 pack/month   Drug use: Never   Sexual activity: Not Currently  Other Topics Concern   Not on file  Social History Narrative   Not on file   Social Determinants of Health   Financial Resource Strain: Not on file  Food Insecurity: Not on file  Transportation Needs: Not on file  Physical Activity: Not on file  Stress: Not on file  Social Connections: Not on file  Intimate Partner Violence: Not on file    Review of Systems: ROS negative except for what is noted on the assessment and plan.  Vitals:   01/23/22 1504 01/23/22 1627 01/23/22 1628  BP: (!) 101/57 101/66 112/72  Pulse: 78 66 78  Temp: 98.3 F (36.8 C)    TempSrc: Oral    SpO2: 100%    Weight: 162 lb 4.8 oz (73.6 kg)    Height: 6'  1" (1.854 m)      ADL/IADL:  Functional in all ADLs except bathing and grooming  Not functional in any IADLs with the exception of using the phone  Physical Exam: Constitutional: Withdrawn, tearful, anxious male in no acute distress Cardiovascular: regular rate and rhythm, no m/r/g Pulmonary/Chest: normal work of breathing on room air, lungs clear to auscultation bilaterally Neurological: alert & oriented x 3, 3/5 strength in bilateral upper and lower extremities, slight limp on the left side Skin: warm and dry     Assessment & Plan:   Depression Since stroke incident on 12/27/21, patient has reported lack of sleep, no interest in eating, and feelings of loneliness and depression. Due to his need for help post-stroke, he feels helpless. His two sisters take care of him however both of them are full time employed and have families of their own.   Plan: -Start  mirtazapine 15 mg daily - Encourage patient that his situation will improve  - Follow up in two weeks for to see if medication has taken effect.   Acute CVA (cerebrovascular accident) ([place]) Pt suffered a stroke of the right internal capsule on 12/27/21, and was discharged from the hospital on 01/01/22. He is able to ambulate with a  limp on the left side, and neurological exam showed good strength in both upper extremities.  He presents today with his sister who is looking for alternative care options for him, as currently she and her other sister are his only caretakers. Options discussed are nursing homes, assisted-living facilities, and the [facility] program.  Pt also said he missed his last physical therapy appointment.  Plan:  -Referral to the clinic's social worker Milus Height) to help with placement for a facility  -Pt sister will communicate with PACE program  -Encouraged to keep up with Physical therapy to help with ambulation and self care  - Follow up in 2 weeks to touch base and come to a decision regarding his placement   Vitamin D deficiency Pt's last lab for Vitamin D was on 06/21/21, and was 10.2.  Plan:  - Prescribed Vitamin D 50000 units, once a week for four weeks -Recheck Vitamin D, B12, and Hb level in two weeks on follow up appointment   Hypotension Pt's initial blood pressure when entering clinic was 101/57, a recheck was done an hour later and the bp was 101/66. After his hospital departure on 6/11, patient was given amlodipine 2.5 mg, which may have been an over-correction of his blood pressure.   Plan:  - Stopped amlodipine  - Will recheck blood pressure at follow up appt in 2 weeks.  - Recommended to take blood pressure readings at home  Patient seen with Dr. Leonard Downing Kaitlyne Friedhoff, M.D. [facility], PGY-1 Phone: [phone] Date 01/23/2022 Time 10:27 PM

## 2022-01-23 NOTE — Assessment & Plan Note (Signed)
Since stroke incident on 12/27/21, patient has reported lack of sleep, no interest in eating, and feelings of loneliness and depression. Due to his need for help post-stroke, he feels helpless. His two sisters take care of him however both of them are full time employed and have families of their own.   Plan: -Start mirtazapine 15 mg daily - Encourage patient that his situation will improve  - Follow up in two weeks for to see if medication has taken effect.

## 2022-01-23 NOTE — Patient Instructions (Addendum)
Thank you for coming into the clinic today! Here is a summary of all the things we talked about.   We discussed your need for further assistance in the home, and we will contact the social worker on our end who will reach out to you, and also discussed the [facility] [place]. We will follow up with you in two weeks to regroup. And discuss where we are on the process.  We talked about your blood pressure as well. It seemed to be a little low, so we're going to remove the Amlodipine 5 mg from your medication regimen.  Also we noticed your vitamin D level was a little low, so we are giving you a Vitamin D pill to take once a week for 4 weeks.  I also am prescribing Mirtazipine, which should help with the sleeping as well as feelings of sadness.    It was a pleasure seeing you today!  Dr. Marlene Lard Coreena Rubalcava

## 2022-01-26 MED ORDER — VITAMIN D (ERGOCALCIFEROL) 1.25 MG (50000 UNIT) PO CAPS: 50000.0000 [IU] | ORAL_CAPSULE | ORAL | 0 refills | Status: DC

## 2022-01-26 MED ORDER — MIRTAZAPINE 15 MG PO TABS: ORAL_TABLET | ORAL | 0 refills | Status: DC

## 2022-01-26 NOTE — Addendum Note (Signed)
Addended by: Riesa Pope on: 01/26/2022 09:37 AM   Modules accepted: Orders

## 2022-01-27 ENCOUNTER — Encounter (HOSPITAL_COMMUNITY): Payer: Self-pay | Admitting: Emergency Medicine

## 2022-01-27 ENCOUNTER — Ambulatory Visit (INDEPENDENT_AMBULATORY_CARE_PROVIDER_SITE_OTHER): Payer: Medicaid Other

## 2022-01-27 ENCOUNTER — Ambulatory Visit (HOSPITAL_COMMUNITY)
Admission: EM | Admit: 2022-01-27 | Discharge: 2022-01-27 | Disposition: A | Discharge status: Home / Self Care | Payer: Medicaid Other | Attending: Family Medicine | Admitting: Family Medicine

## 2022-01-27 DIAGNOSIS — K5900 Constipation, unspecified: Secondary | ICD-10-CM

## 2022-01-27 DIAGNOSIS — G4700 Insomnia, unspecified: Secondary | ICD-10-CM | POA: Diagnosis not present

## 2022-01-27 MED ORDER — TRAZODONE HCL 150 MG PO TABS: 150.0000 mg | ORAL_TABLET | Freq: Every evening | ORAL | 2 refills | Status: DC | PRN

## 2022-01-27 NOTE — ED Triage Notes (Addendum)
Pt reports running out of his sleeping medication lastnight. Requesting med refill. (Trazodone 150 mg)   Also endorses constipation. States last normal BM was yesterday.

## 2022-01-27 NOTE — ED Provider Notes (Signed)
[facility]    CSN: [id number] Arrival date & time: 01/27/22  1017      History   Chief Complaint Chief Complaint  Patient presents with   Medication Refill   Insomnia   Constipation    HPI Chad Santos is a 61 y.o. male.    Medication Refill Insomnia  Constipation  Here for a new prescription of his trazodone.  That has been helping his insomnia well when he needs it.  He was prescribed mirtazapine but only for a week, recently from his family medicine visit.  He has had a stroke since I last saw him.  He is going to physical therapy, and he is using a makeshift piece of wood as a cane today.  He notes a feeling of needing to go to the bathroom and have a bowel movement, but he cannot.  He has been to the bathroom several times since here was triaged into his exam room here.  He has not been able to have a bowel movement so far.  Last normal bowel movement was 2 days ago.  He feels he is not passing gas either.  No vomiting or nausea.  No abdominal pain  Past Medical History:  Diagnosis Date   Hypertension     Patient Active Problem List   Diagnosis Date Noted   Depression 01/23/2022   Vitamin D deficiency 01/23/2022   Hypotension 01/23/2022   Acute ischemic stroke ([place]) 01/01/2022   Acute CVA (cerebrovascular accident) ([place]) 12/28/2021   Upper GI bleeding 08/21/2021   Elevated troponin 08/21/2021   Bilateral leg pain 08/21/2021   Agitation 08/21/2021   Blood in the stool    Normocytic anemia     Past Surgical History:  Procedure Laterality Date   BIOPSY  08/23/2021   Procedure: BIOPSY;  Surgeon: [name], MD;  Location: [facility];  Service: Gastroenterology;;   COLONOSCOPY WITH PROPOFOL N/A 08/23/2021   Procedure: COLONOSCOPY WITH PROPOFOL;  Surgeon: [name], MD;  Location: [facility];  Service: Gastroenterology;  Laterality: N/A;   ESOPHAGOGASTRODUODENOSCOPY (EGD) WITH PROPOFOL N/A 08/23/2021   Procedure:  ESOPHAGOGASTRODUODENOSCOPY (EGD) WITH PROPOFOL;  Surgeon: [name], MD;  Location: [facility];  Service: Gastroenterology;  Laterality: N/A;   HAND SURGERY Right    POLYPECTOMY  08/23/2021   Procedure: POLYPECTOMY;  Surgeon: [name], MD;  Location: [facility] ENDOSCOPY;  Service: Gastroenterology;;       Home Medications    Prior to Admission medications   Medication Sig Start Date End Date Taking? Authorizing Provider  acetaminophen (TYLENOL) 325 MG tablet Take 1-2 tablets (325-650 mg total) by mouth every 4 (four) hours as needed for mild pain. 01/09/22   Setzer, Edman Circle, PA-C  ALPRAZolam Duanne Moron) 0.5 MG tablet Take 1 tablet (0.5 mg total) by mouth 2 (two) times daily as needed for anxiety (for agitation). 01/09/22   Setzer, Edman Circle, PA-C  atorvastatin (LIPITOR) 80 MG tablet Take 1 tablet (80 mg total) by mouth at bedtime. 01/09/22   Setzer, Edman Circle, PA-C  bacitracin ointment Apply 1 Application topically 2 (two) times daily. 01/09/22   Setzer, Edman Circle, PA-C  clopidogrel (PLAVIX) 75 MG tablet Take 1 tablet (75 mg total) by mouth daily. 01/09/22   Setzer, Edman Circle, PA-C  methocarbamol (ROBAXIN) 500 MG tablet Take 1 tablet (500 mg total) by mouth every 6 (six) hours as needed for muscle spasms. 01/09/22   Setzer, Edman Circle, PA-C  nicotine (NICODERM CQ - DOSED IN MG/24  HOURS) 21 mg/24hr patch Place 1 patch (21 mg total) onto the skin daily. 01/10/22   Setzer, Edman Circle, PA-C  pantoprazole (PROTONIX) 40 MG tablet Take 1 tablet (40 mg total) by mouth 2 (two) times daily. 01/09/22   Setzer, Edman Circle, PA-C  traZODone (DESYREL) 150 MG tablet Take 1 tablet (150 mg total) by mouth at bedtime as needed for sleep. 01/27/22   Barrett Henle, MD  Vitamin D, Ergocalciferol, (DRISDOL) 1.25 MG (50000 UNIT) CAPS capsule Take 1 capsule (50,000 Units total) by mouth every 7 (seven) days. 01/26/22   Riesa Pope, MD    Family History Family History  Problem Relation Age of Onset   Colon cancer Neg  Hx    Esophageal cancer Neg Hx    Rectal cancer Neg Hx    Stomach cancer Neg Hx     Social History Social History   Tobacco Use   Smoking status: Every Day    Packs/day: 1.00    Years: 37.00    Total pack years: 37.00    Types: Cigarettes   Smokeless tobacco: Never   Tobacco comments:    Reports <1ppd  Vaping Use   Vaping Use: Never used  Substance Use Topics   Alcohol use: Not Currently    Comment: occasionally,6 pack/month   Drug use: Never     Allergies   Patient has no known allergies.   Review of Systems Review of Systems  Gastrointestinal:  Positive for constipation.  Psychiatric/Behavioral:  The patient has insomnia.      Physical Exam Triage Vital Signs ED Triage Vitals  Enc Vitals Group     BP 01/27/22 1123 131/81     Pulse Rate 01/27/22 1123 86     Resp 01/27/22 1123 19     Temp 01/27/22 1123 98 F (36.7 C)     Temp Source 01/27/22 1123 Oral     SpO2 01/27/22 1123 100 %     Weight --      Height --      Head Circumference --      Peak Flow --      Pain Score 01/27/22 1122 0     Pain Loc --      Pain Edu? --      Excl. in [place]? --    No data found.  Updated Vital Signs BP 131/81 (BP Location: Right Arm)   Pulse 86   Temp 98 F (36.7 C) (Oral)   Resp 19   SpO2 100%   Visual Acuity Right Eye Distance:   Left Eye Distance:   Bilateral Distance:    Right Eye Near:   Left Eye Near:    Bilateral Near:     Physical Exam Vitals reviewed.  Constitutional:      General: He is not in acute distress.    Appearance: He is not toxic-appearing.  HENT:     Mouth/Throat:     Mouth: Mucous membranes are moist.     Pharynx: No oropharyngeal exudate or posterior oropharyngeal erythema.  Eyes:     Extraocular Movements: Extraocular movements intact.     Conjunctiva/sclera: Conjunctivae normal.     Pupils: Pupils are equal, round, and reactive to light.  Cardiovascular:     Rate and Rhythm: Normal rate and regular rhythm.     Heart sounds:  No murmur heard. Pulmonary:     Effort: Pulmonary effort is normal.     Breath sounds: Normal breath sounds.  Abdominal:  General: There is no distension.     Palpations: Abdomen is soft.     Tenderness: There is abdominal tenderness (generalized).  Musculoskeletal:     Cervical back: Neck supple.  Lymphadenopathy:     Cervical: No cervical adenopathy.  Skin:    Coloration: Skin is not jaundiced or pale.  Neurological:     Mental Status: He is alert.  Psychiatric:        Behavior: Behavior normal.      UC Treatments / Results  Labs (all labs ordered are listed, but only abnormal results are displayed) Labs Reviewed - No data to display  EKG   Radiology DG Abd 2 Views  Result Date: 01/27/2022 CLINICAL DATA:  Constipation.  Tenesmus. EXAM: ABDOMEN - 2 VIEW COMPARISON:  None Available. FINDINGS: Prominent stool throughout the colon favors constipation. No abnormal air-fluid levels. Severe bilateral degenerative hip arthropathy. Iliac artery atherosclerotic vascular calcifications. IMPRESSION: 1.  Prominent stool throughout the colon favors constipation. 2. Severe bilateral degenerative hip arthropathy. 3. Atherosclerosis. Electronically Signed   By: Van Clines M.D.   On: 01/27/2022 12:35    Procedures Procedures (including critical care time)  Medications Ordered in UC Medications - No data to display  Initial Impression / Assessment and Plan / UC Course  I have reviewed the triage vital signs and the nursing notes.  Pertinent labs & imaging results that were available during my care of the patient were reviewed by me and considered in my medical decision making (see chart for details).     There is a large amount of stool in the colon, and into the rectum. I am recommending miralax and possibly dulcolax suppository /fleets enema. There is no sign of obstruction.  Trazodone was sent in.  I have asked him to speak with his PT or with his PCP about a more  appropriate cane rx. Final Clinical Impressions(s) / UC Diagnoses   Final diagnoses:  Insomnia, unspecified type  Constipation, unspecified constipation type     Discharge Instructions      Your xray showed lots of stool through out your colon, including a ball of it in your rectum.  Start taking miralax according to the package instructions--1 capful daily.  It will take a few days for that to help enough. So use a dulcolax suppository, and/or a fleets enema to get things started.   Lease speak to your primary care doctor or to your physical therapist about possibly getting better came to use while walking.     ED Prescriptions     Medication Sig Dispense Auth. Provider   traZODone (DESYREL) 150 MG tablet Take 1 tablet (150 mg total) by mouth at bedtime as needed for sleep. 30 tablet Ritika Hellickson, Gwenlyn Perking, MD      I have reviewed the PDMP during this encounter.   Barrett Henle, MD 01/27/22 1248

## 2022-01-27 NOTE — Discharge Instructions (Addendum)
Your xray showed lots of stool through out your colon, including a ball of it in your rectum.  Start taking miralax according to the package instructions--1 capful daily.  It will take a few days for that to help enough. So use a dulcolax suppository, and/or a fleets enema to get things started.   Lease speak to your primary care doctor or to your physical therapist about possibly getting better came to use while walking.

## 2022-01-31 ENCOUNTER — Observation Stay (HOSPITAL_COMMUNITY): Payer: Medicaid Other

## 2022-01-31 ENCOUNTER — Other Ambulatory Visit: Payer: Self-pay | Admitting: Internal Medicine

## 2022-01-31 ENCOUNTER — Other Ambulatory Visit: Payer: Self-pay

## 2022-01-31 ENCOUNTER — Inpatient Hospital Stay (HOSPITAL_COMMUNITY)
Admission: EM | Admit: 2022-01-31 | Discharge: 2022-02-03 | DRG: 057 | Disposition: A | Discharge status: Skilled Nursing Facility | Payer: Medicaid Other | Attending: Internal Medicine | Admitting: Internal Medicine

## 2022-01-31 ENCOUNTER — Emergency Department (HOSPITAL_COMMUNITY): Payer: Medicaid Other

## 2022-01-31 DIAGNOSIS — Z79899 Other long term (current) drug therapy: Secondary | ICD-10-CM

## 2022-01-31 DIAGNOSIS — R29810 Facial weakness: Secondary | ICD-10-CM | POA: Diagnosis present

## 2022-01-31 DIAGNOSIS — Z5948 Other specified lack of adequate food: Secondary | ICD-10-CM

## 2022-01-31 DIAGNOSIS — F17210 Nicotine dependence, cigarettes, uncomplicated: Secondary | ICD-10-CM | POA: Diagnosis present

## 2022-01-31 DIAGNOSIS — G4700 Insomnia, unspecified: Secondary | ICD-10-CM | POA: Diagnosis present

## 2022-01-31 DIAGNOSIS — R531 Weakness: Secondary | ICD-10-CM

## 2022-01-31 DIAGNOSIS — I69354 Hemiplegia and hemiparesis following cerebral infarction affecting left non-dominant side: Principal | ICD-10-CM

## 2022-01-31 DIAGNOSIS — G459 Transient cerebral ischemic attack, unspecified: Secondary | ICD-10-CM | POA: Diagnosis not present

## 2022-01-31 DIAGNOSIS — R410 Disorientation, unspecified: Secondary | ICD-10-CM

## 2022-01-31 DIAGNOSIS — I69391 Dysphagia following cerebral infarction: Secondary | ICD-10-CM

## 2022-01-31 DIAGNOSIS — N179 Acute kidney failure, unspecified: Secondary | ICD-10-CM | POA: Diagnosis present

## 2022-01-31 DIAGNOSIS — Z608 Other problems related to social environment: Secondary | ICD-10-CM | POA: Diagnosis present

## 2022-01-31 DIAGNOSIS — E7800 Pure hypercholesterolemia, unspecified: Secondary | ICD-10-CM | POA: Diagnosis not present

## 2022-01-31 DIAGNOSIS — F17200 Nicotine dependence, unspecified, uncomplicated: Secondary | ICD-10-CM | POA: Diagnosis not present

## 2022-01-31 DIAGNOSIS — I10 Essential (primary) hypertension: Secondary | ICD-10-CM | POA: Diagnosis present

## 2022-01-31 DIAGNOSIS — W19XXXA Unspecified fall, initial encounter: Secondary | ICD-10-CM

## 2022-01-31 DIAGNOSIS — K2270 Barrett's esophagus without dysplasia: Secondary | ICD-10-CM | POA: Diagnosis present

## 2022-01-31 DIAGNOSIS — E785 Hyperlipidemia, unspecified: Secondary | ICD-10-CM | POA: Diagnosis present

## 2022-01-31 DIAGNOSIS — F419 Anxiety disorder, unspecified: Secondary | ICD-10-CM | POA: Diagnosis present

## 2022-01-31 DIAGNOSIS — Z8673 Personal history of transient ischemic attack (TIA), and cerebral infarction without residual deficits: Secondary | ICD-10-CM

## 2022-01-31 DIAGNOSIS — I639 Cerebral infarction, unspecified: Secondary | ICD-10-CM

## 2022-01-31 DIAGNOSIS — F32A Depression, unspecified: Secondary | ICD-10-CM | POA: Diagnosis present

## 2022-01-31 DIAGNOSIS — E860 Dehydration: Secondary | ICD-10-CM | POA: Diagnosis present

## 2022-01-31 DIAGNOSIS — K279 Peptic ulcer, site unspecified, unspecified as acute or chronic, without hemorrhage or perforation: Secondary | ICD-10-CM | POA: Diagnosis present

## 2022-01-31 DIAGNOSIS — G9349 Other encephalopathy: Secondary | ICD-10-CM | POA: Diagnosis present

## 2022-01-31 DIAGNOSIS — D509 Iron deficiency anemia, unspecified: Secondary | ICD-10-CM | POA: Diagnosis present

## 2022-01-31 LAB — RAPID URINE DRUG SCREEN, HOSP PERFORMED
Amphetamines: NOT DETECTED
Barbiturates: NOT DETECTED
Benzodiazepines: NOT DETECTED
Cocaine: NOT DETECTED
Opiates: NOT DETECTED
Tetrahydrocannabinol: POSITIVE — AB

## 2022-01-31 LAB — URINALYSIS, COMPLETE (UACMP) WITH MICROSCOPIC
Bilirubin Urine: NEGATIVE
Glucose, UA: NEGATIVE mg/dL
Hgb urine dipstick: NEGATIVE
Ketones, ur: NEGATIVE mg/dL
Leukocytes,Ua: NEGATIVE
Nitrite: NEGATIVE
Protein, ur: 30 mg/dL — AB
Specific Gravity, Urine: 1.019 (ref 1.005–1.030)
pH: 7 (ref 5.0–8.0)

## 2022-01-31 LAB — CBC
HCT: 35 % — ABNORMAL LOW (ref 39.0–52.0)
Hemoglobin: 11.2 g/dL — ABNORMAL LOW (ref 13.0–17.0)
MCH: 28.9 pg (ref 26.0–34.0)
MCHC: 32 g/dL (ref 30.0–36.0)
MCV: 90.2 fL (ref 80.0–100.0)
Platelets: 301 10*3/uL (ref 150–400)
RBC: 3.88 MIL/uL — ABNORMAL LOW (ref 4.22–5.81)
RDW: 14.2 % (ref 11.5–15.5)
WBC: 5.6 10*3/uL (ref 4.0–10.5)
nRBC: 0 % (ref 0.0–0.2)

## 2022-01-31 LAB — DIFFERENTIAL
Abs Immature Granulocytes: 0.01 10*3/uL (ref 0.00–0.07)
Basophils Absolute: 0.1 10*3/uL (ref 0.0–0.1)
Basophils Relative: 1 %
Eosinophils Absolute: 0.1 10*3/uL (ref 0.0–0.5)
Eosinophils Relative: 2 %
Immature Granulocytes: 0 %
Lymphocytes Relative: 38 %
Lymphs Abs: 2.1 10*3/uL (ref 0.7–4.0)
Monocytes Absolute: 0.4 10*3/uL (ref 0.1–1.0)
Monocytes Relative: 7 %
Neutro Abs: 3 10*3/uL (ref 1.7–7.7)
Neutrophils Relative %: 52 %

## 2022-01-31 LAB — I-STAT CHEM 8, ED
BUN: 28 mg/dL — ABNORMAL HIGH (ref 8–23)
Calcium, Ion: 1.12 mmol/L — ABNORMAL LOW (ref 1.15–1.40)
Chloride: 102 mmol/L (ref 98–111)
Creatinine, Ser: 2.3 mg/dL — ABNORMAL HIGH (ref 0.61–1.24)
Glucose, Bld: 133 mg/dL — ABNORMAL HIGH (ref 70–99)
HCT: 34 % — ABNORMAL LOW (ref 39.0–52.0)
Hemoglobin: 11.6 g/dL — ABNORMAL LOW (ref 13.0–17.0)
Potassium: 4.1 mmol/L (ref 3.5–5.1)
Sodium: 138 mmol/L (ref 135–145)
TCO2: 27 mmol/L (ref 22–32)

## 2022-01-31 LAB — COMPREHENSIVE METABOLIC PANEL
ALT: 10 U/L (ref 0–44)
AST: 18 U/L (ref 15–41)
Albumin: 3.5 g/dL (ref 3.5–5.0)
Alkaline Phosphatase: 82 U/L (ref 38–126)
Anion gap: 10 (ref 5–15)
BUN: 28 mg/dL — ABNORMAL HIGH (ref 8–23)
CO2: 27 mmol/L (ref 22–32)
Calcium: 9.1 mg/dL (ref 8.9–10.3)
Chloride: 101 mmol/L (ref 98–111)
Creatinine, Ser: 2.21 mg/dL — ABNORMAL HIGH (ref 0.61–1.24)
GFR, Estimated: 33 mL/min — ABNORMAL LOW (ref >60)
Glucose, Bld: 130 mg/dL — ABNORMAL HIGH (ref 70–99)
Potassium: 4.1 mmol/L (ref 3.5–5.1)
Sodium: 138 mmol/L (ref 135–145)
Total Bilirubin: 0.6 mg/dL (ref 0.3–1.2)
Total Protein: 7.1 g/dL (ref 6.5–8.1)

## 2022-01-31 LAB — PROTIME-INR
INR: 1 (ref 0.8–1.2)
Prothrombin Time: 13.4 seconds (ref 11.4–15.2)

## 2022-01-31 LAB — CBG MONITORING, ED: Glucose-Capillary: 129 mg/dL — ABNORMAL HIGH (ref 70–99)

## 2022-01-31 LAB — ETHANOL: Alcohol, Ethyl (B): <10 mg/dL (ref <10)

## 2022-01-31 LAB — APTT: aPTT: 32 seconds (ref 24–36)

## 2022-01-31 MED ORDER — CLOPIDOGREL BISULFATE 75 MG PO TABS
75.0000 mg | ORAL_TABLET | Freq: Every day | ORAL | Status: DC
  Administered 2022-02-01 – 2022-02-03 (×3): 75 mg via ORAL
  Filled 2022-01-31 (×3): qty 1

## 2022-01-31 MED ORDER — PANTOPRAZOLE SODIUM 40 MG PO TBEC
40.0000 mg | DELAYED_RELEASE_TABLET | Freq: Two times a day (BID) | ORAL | Status: DC
  Administered 2022-01-31: 40 mg via ORAL
  Filled 2022-01-31: qty 1

## 2022-01-31 MED ORDER — MIRTAZAPINE 15 MG PO TABS
15.0000 mg | ORAL_TABLET | Freq: Every day | ORAL | Status: DC
  Administered 2022-02-01 – 2022-02-02 (×3): 15 mg via ORAL
  Filled 2022-01-31 (×3): qty 1

## 2022-01-31 MED ORDER — SODIUM CHLORIDE 0.9% FLUSH: 3.0000 mL | Freq: Once | INTRAVENOUS | Status: DC

## 2022-01-31 MED ORDER — ACETAMINOPHEN 325 MG PO TABS
650.0000 mg | ORAL_TABLET | Freq: Four times a day (QID) | ORAL | Status: DC | PRN
  Administered 2022-01-31: 650 mg via ORAL
  Filled 2022-01-31: qty 2

## 2022-01-31 MED ORDER — TRAZODONE HCL 50 MG PO TABS
150.0000 mg | ORAL_TABLET | Freq: Every evening | ORAL | Status: DC | PRN
  Administered 2022-01-31 – 2022-02-02 (×3): 150 mg via ORAL
  Filled 2022-01-31: qty 1
  Filled 2022-01-31: qty 3
  Filled 2022-01-31: qty 1

## 2022-01-31 MED ORDER — NICOTINE 21 MG/24HR TD PT24
21.0000 mg | MEDICATED_PATCH | Freq: Every day | TRANSDERMAL | Status: DC
  Administered 2022-01-31 – 2022-02-03 (×4): 21 mg via TRANSDERMAL
  Filled 2022-01-31 (×4): qty 1

## 2022-01-31 MED ORDER — LACTATED RINGERS IV BOLUS
500.0000 mL | Freq: Once | INTRAVENOUS | Status: AC
  Administered 2022-02-01: 500 mL via INTRAVENOUS

## 2022-01-31 MED ORDER — VITAMIN D (ERGOCALCIFEROL) 1.25 MG (50000 UNIT) PO CAPS
50000.0000 [IU] | ORAL_CAPSULE | ORAL | Status: DC
  Administered 2022-02-01: 50000 [IU] via ORAL
  Filled 2022-01-31: qty 1

## 2022-01-31 MED ORDER — ATORVASTATIN CALCIUM 80 MG PO TABS
80.0000 mg | ORAL_TABLET | Freq: Every day | ORAL | Status: DC
  Administered 2022-01-31 – 2022-02-02 (×3): 80 mg via ORAL
  Filled 2022-01-31: qty 1
  Filled 2022-01-31: qty 2
  Filled 2022-01-31: qty 1

## 2022-01-31 MED ORDER — ALPRAZOLAM 0.5 MG PO TABS
0.5000 mg | ORAL_TABLET | Freq: Two times a day (BID) | ORAL | Status: DC | PRN
Start: 2022-01-31 — End: 2022-02-03
  Administered 2022-01-31 – 2022-02-02 (×4): 0.5 mg via ORAL
  Filled 2022-01-31: qty 2
  Filled 2022-01-31: qty 1
  Filled 2022-01-31: qty 2
  Filled 2022-01-31: qty 1

## 2022-01-31 MED ORDER — RIVAROXABAN 10 MG PO TABS
10.0000 mg | ORAL_TABLET | Freq: Every day | ORAL | Status: DC
Start: 2022-02-01 — End: 2022-02-03
  Administered 2022-02-01 – 2022-02-03 (×3): 10 mg via ORAL
  Filled 2022-01-31 (×3): qty 1

## 2022-01-31 NOTE — ED Triage Notes (Signed)
Pt reports weakness to his left leg. He reports he had a stroke one month ago which caused residual left sided weakness, facial droop and slurred speech. He states his only new symptom is increased weakness to his left leg. + drift. When asked when it started he stated two hour ago, when asked again he stated it started last night. When asked a third time he stated it started yesterday.

## 2022-01-31 NOTE — ED Provider Notes (Signed)
[facility] EMERGENCY DEPARTMENT Provider Note   CSN: [id number] Arrival date & time: 01/31/22  1604  An emergency department physician performed an initial assessment on this suspected stroke patient at 1637.  History  Chief Complaint  Patient presents with   Weakness   Code Stroke    Chad Santos is a 61 y.o. male.  61 year old male presents emergency room with concern for stroke.  Patient reports he had a stroke 1 month ago resulting in difficulty with his speech, left arm and left leg weakness.  Patient states on Sunday (2 days ago) he noticed that his left leg was not working very well.  This resolved and he reports having a normal/baseline day yesterday.  Patient states that today around 2 PM he went to stand up from a chair and noticed that his left leg would not move and that his speech was more slurred than usual.  Patient presented to the emergency room as a code stroke through triage, was evaluated by neurology and taken to CT. He states that he has had several falls recently, relates this to his difficulty walking with his left leg weakness.  Unable to say when his last fall was.  Patient is on Plavix.  Reports abrasions to his right arm, leg, left forehead area.       Home Medications Prior to Admission medications   Medication Sig Start Date End Date Taking? Authorizing Provider  acetaminophen (TYLENOL) 325 MG tablet Take 1-2 tablets (325-650 mg total) by mouth every 4 (four) hours as needed for mild pain. 01/09/22   Setzer, Edman Circle, PA-C  ALPRAZolam Duanne Moron) 0.5 MG tablet Take 1 tablet (0.5 mg total) by mouth 2 (two) times daily as needed for anxiety (for agitation). 01/09/22   Setzer, Edman Circle, PA-C  atorvastatin (LIPITOR) 80 MG tablet Take 1 tablet (80 mg total) by mouth at bedtime. 01/09/22   Setzer, Edman Circle, PA-C  bacitracin ointment Apply 1 Application topically 2 (two) times daily. 01/09/22   Setzer, Edman Circle, PA-C  clopidogrel (PLAVIX) 75 MG  tablet Take 1 tablet (75 mg total) by mouth daily. 01/09/22   Setzer, Edman Circle, PA-C  methocarbamol (ROBAXIN) 500 MG tablet Take 1 tablet (500 mg total) by mouth every 6 (six) hours as needed for muscle spasms. 01/09/22   Setzer, Edman Circle, PA-C  nicotine (NICODERM CQ - DOSED IN MG/24 HOURS) 21 mg/24hr patch Place 1 patch (21 mg total) onto the skin daily. 01/10/22   Setzer, Edman Circle, PA-C  pantoprazole (PROTONIX) 40 MG tablet Take 1 tablet (40 mg total) by mouth 2 (two) times daily. 01/09/22   Setzer, Edman Circle, PA-C  traZODone (DESYREL) 150 MG tablet Take 1 tablet (150 mg total) by mouth at bedtime as needed for sleep. 01/27/22   Barrett Henle, MD  Vitamin D, Ergocalciferol, (DRISDOL) 1.25 MG (50000 UNIT) CAPS capsule Take 1 capsule (50,000 Units total) by mouth every 7 (seven) days. 01/26/22   Riesa Pope, MD      Allergies    Patient has no known allergies.    Review of Systems   Review of Systems Negative except as per HPI Physical Exam Updated Vital Signs BP (!) 147/85   Pulse 74   Temp 98.1 F (36.7 C) (Oral)   Resp 18   Ht '6\' 1"'$  (1.854 m)   Wt 73.6 kg   SpO2 96%   BMI 21.41 kg/m  Physical Exam Vitals and nursing note reviewed.  Constitutional:  General: He is not in acute distress.    Appearance: He is well-developed. He is not diaphoretic.  HENT:     Head: Normocephalic and atraumatic.     Mouth/Throat:     Mouth: Mucous membranes are moist.  Eyes:     Extraocular Movements: Extraocular movements intact.     Pupils: Pupils are equal, round, and reactive to light.  Cardiovascular:     Rate and Rhythm: Normal rate and regular rhythm.     Heart sounds: Normal heart sounds.  Pulmonary:     Effort: Pulmonary effort is normal.     Breath sounds: Normal breath sounds.  Abdominal:     Palpations: Abdomen is soft.     Tenderness: There is no abdominal tenderness.  Musculoskeletal:     Right lower leg: No edema.     Left lower leg: No edema.  Skin:     General: Skin is warm and dry.     Findings: No erythema or rash.  Neurological:     Mental Status: He is alert. He is disoriented.     Sensory: No sensory deficit.     Motor: Weakness present.  Psychiatric:        Behavior: Behavior normal.     ED Results / Procedures / Treatments   Labs (all labs ordered are listed, but only abnormal results are displayed) Labs Reviewed  CBC - Abnormal; Notable for the following components:      Result Value   RBC 3.88 (*)    Hemoglobin 11.2 (*)    HCT 35.0 (*)    All other components within normal limits  COMPREHENSIVE METABOLIC PANEL - Abnormal; Notable for the following components:   Glucose, Bld 130 (*)    BUN 28 (*)    Creatinine, Ser 2.21 (*)    GFR, Estimated 33 (*)    All other components within normal limits  CBG MONITORING, ED - Abnormal; Notable for the following components:   Glucose-Capillary 129 (*)    All other components within normal limits  I-STAT CHEM 8, ED - Abnormal; Notable for the following components:   BUN 28 (*)    Creatinine, Ser 2.30 (*)    Glucose, Bld 133 (*)    Calcium, Ion 1.12 (*)    Hemoglobin 11.6 (*)    HCT 34.0 (*)    All other components within normal limits  PROTIME-INR  APTT  DIFFERENTIAL  ETHANOL  RAPID URINE DRUG SCREEN, HOSP PERFORMED  URINALYSIS, COMPLETE (UACMP) WITH MICROSCOPIC  CBG MONITORING, ED    EKG None  Radiology MR BRAIN WO CONTRAST  Result Date: 01/31/2022 CLINICAL DATA:  Neuro deficit, acute, stroke suspected. Left-sided weakness and facial droop. EXAM: MRI HEAD WITHOUT CONTRAST TECHNIQUE: Multiplanar, multiecho pulse sequences of the brain and surrounding structures were obtained without intravenous contrast. COMPARISON:  MR head with out contrast 12/28/2021 FINDINGS: Brain: Residual T2 shine through is present in the subacute infarct along the posterior limb of the right internal capsule no acute infarct, hemorrhage, or mass lesion is present. Study is moderately degraded  by patient motion. Asymmetric left periventricular T2 hyperintensities again noted with some volume loss. Moderate generalized atrophy is present. White matter disease is stable. The internal auditory canals are within normal limits. Remote lacunar infarcts are present the posterior cerebellum bilaterally, stable brainstem and cerebellum are otherwise within normal limits. Vascular: Flow is present in the major intracranial arteries. Skull and upper cervical spine: The craniocervical junction is normal. Upper cervical spine is within  normal limits. Marrow signal is unremarkable. Sinuses/Orbits: The paranasal sinuses and mastoid air cells are clear. The globes and orbits are within normal limits. IMPRESSION: 1. No acute intracranial abnormality or significant interval change. 2. Expected evolution of subacute infarct involving the posterior limb of the right internal capsule. 3. Stable atrophy and white matter disease. This likely reflects the sequela of chronic microvascular ischemia. Electronically Signed   By: San Morelle M.D.   On: 01/31/2022 18:58   MR ANGIO HEAD WO CONTRAST  Result Date: 01/31/2022 CLINICAL DATA:  Neuro deficit, acute, stroke suspected. Left-sided weakness and facial droop. EXAM: MRA HEAD WITHOUT CONTRAST TECHNIQUE: Angiographic images of the Circle of Willis were acquired using MRA technique without intravenous contrast. COMPARISON:  MRA circle-of-Willis 12/28/2021 FINDINGS: Anterior circulation: Internal carotid arteries are within normal limits from the high cervical segments through the ICA termini bilaterally. The A1 and M1 segments are normal. No definite anterior communicating artery is present. MCA bifurcations are intact. The ACA and MCA branch vessels are normal. Posterior circulation: The rights vertebral artery is dominant. PICA origins are below the field of view. Vertebrobasilar junction and basilar artery are normal. The right posterior cerebral artery is of  scratched at the right posterior cerebral artery originates from the basilar tip with a prominent right posterior communicating artery. The left posterior cerebral artery is of fetal type with a small left P1 segment contributing. PCA branch vessels are within normal limits. Anatomic variants: Fetal type left posterior cerebral artery. Other: None. IMPRESSION: 1. Normal MRA circle-of-Willis without evidence for significant proximal stenosis, aneurysm, or branch vessel occlusion. 2. Fetal type left posterior cerebral artery.  The Electronically Signed   By: San Morelle M.D.   On: 01/31/2022 18:55   CT HEAD CODE STROKE WO CONTRAST  Result Date: 01/31/2022 CLINICAL DATA:  Code stroke.  Neuro deficit, acute, stroke suspected EXAM: CT HEAD WITHOUT CONTRAST TECHNIQUE: Contiguous axial images were obtained from the base of the skull through the vertex without intravenous contrast. RADIATION DOSE REDUCTION: This exam was performed according to the departmental dose-optimization program which includes automated exposure control, adjustment of the mA and/or kV according to patient size and/or use of iterative reconstruction technique. COMPARISON:  CT head and MRI head December 28 2021. FINDINGS: Brain: Prior left cerebellar and bilateral basal ganglia infarcts. Additional patchy white matter hypoattenuation, nonspecific but compatible with chronic microvascular ischemic disease. No evidence of acute large vascular territory infarct, acute hemorrhage, mass lesion, midline shift low or acute hemorrhage. Vascular: No hyperdense vessel identified. Calcific intracranial atherosclerosis. Skull: No acute fracture. Sinuses/Orbits: Clear visualized sinuses. No acute orbital findings. Other: No mastoid effusions. ASPECTS [facility] Stroke Program Early CT Score) total score (0-10 with 10 being normal): 10. IMPRESSION: 1. No evidence of acute intracranial abnormality. ASPECTS is 10. 2. Remote infarcts and chronic microvascular  ischemic disease. Code stroke imaging results were communicated on 01/31/2022 at 4:53 pm to provider Dr. Erlinda Hong via secure text paging. Electronically Signed   By: Margaretha Sheffield M.D.   On: 01/31/2022 16:53    Procedures Procedures    Medications Ordered in ED Medications  sodium chloride flush (NS) 0.9 % injection 3 mL (3 mLs Intravenous Not Given 01/31/22 1813)    ED Course/ Medical Decision Making/ A&P                           Medical Decision Making Risk Decision regarding hospitalization.   This patient presents to the ED  for concern of left leg weakness and slurred speech, this involves an extensive number of treatment options, and is a complaint that carries with it a high risk of complications and morbidity.  The differential diagnosis includes but not limited to CVA versus TIA   Co morbidities that complicate the patient evaluation  CVA, Daily smoker, upper GI bleed   Additional history obtained:  External records from outside source obtained and reviewed including echo from 12/29/2021 with EF of 40 to 45%   Lab Tests:  I Ordered, and personally interpreted labs.  The pertinent results include: CBC with hemoglobin 11.2, noted be gradual downtrend from 1 month ago at 14.  CMP with creatinine 2.21, previously 1.3.  Alcohol negative.   Imaging Studies ordered:  I ordered imaging studies including CT head, MRI, MRA brain I independently visualized and interpreted imaging which showed CT head without acute findings. I agree with the radiologist interpretation.  MRI and MRA as read by radiology without acute findings   Cardiac Monitoring: / EKG:  The patient was maintained on a cardiac monitor.  I personally viewed and interpreted the cardiac monitored which showed an underlying rhythm of: Normal sinus rhythm, rate 85   Consultations Obtained:  I requested consultation with the neuro hospitalist, Dr. Erlinda Hong,  and discussed lab and imaging findings as well as pertinent  plan - they recommend: Not TNK candidate, recommend admit to the hospital with neuro to follow.  MRI, MRA ordered. Discussed with internal medicine service who agreed to consult on patient.  Call back from internal medicine service notes patient was admitted by [facility] hospitalist service 1 month ago and defers consult to [facility].  Discussed with [facility] hospitalist service, MRI and MRA returned without acute process, questions if patient would benefit from admission versus outpatient PT.  Group discussion was held between Dr. Leonel Ramsay with neuro hospitalist service and [facility] hospitalist Dr. Nevada Crane with decision ultimately for outpatient PT.  In the meantime call back from internal medicine resident service who agrees to consult and admit.  Discussed with patient who states he lives alone on a single floor home does not feel safe going home due to his multiple falls and would like to be considered for SNF placement following admission.  Discussed with internal medicine service who agrees to admit.  Discussed with TOC who will follow.   Problem List / ED Course / Critical interventions / Medication management  61 year old male presents with complaint of left leg weakness and speech changes, noted to be acute at 2 PM today.  Patient arrived via triage, was made a code stroke on arrival and was seen promptly by the neurology team.  CT head was unremarkable and plan was for admission for MRI/MRA for further stroke work-up and monitoring due to his report of several falls recently.  Consult history as above.  Patient feels like his left leg weakness has returned to baseline although notes his speech is still slurred.  Patient is agreeable with plan for admission. Consider IV fluids for his increasing creatinine today however with prior echo with EF of 40 to 45%, will defer management to hospitalist team. I have reviewed the patients home medicines and have made adjustments as needed   Social Determinants of  Health:  Lives alone, states he does not have family to help with his care, multiple falls, on Plavix   Test / Admission - Considered:  Plan is to admit for reasons as listed above  Final Clinical Impression(s) / ED Diagnoses Final diagnoses:  TIA (transient ischemic attack)  Dehydration  Fall, initial encounter    Rx / DC Orders ED Discharge Orders     None         Roque Lias 01/31/22 2009    Sherwood Gambler, MD 02/01/22 [phone]

## 2022-01-31 NOTE — H&P (Addendum)
Date: 02/01/2022               Patient Name:  Chad Santos MRN: [id number]  DOB: [birth year] Age / Sex: 61 y.o., male   PCP: [facility]         Medical Service: Internal Medicine Teaching Service         Attending Physician: Dr. Charise Killian, MD    First Contact: Romana Juniper, MD  Pager: [phone]  Second Contact: Virl Axe, MD Pager: [phone]        After Hours (After 5p/  First Contact Pager: [phone]  weekends / holidays): Second Contact Pager: [phone]   SUBJECTIVE   Chief Complaint: weakness  History of Present Illness:  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 Reports difficult with lifting his leg up in the morning. No improvement in symptoms since they have started. He is normally able to ambulate with a walker since discharge from rehab from his recent CVA, but now only is able to walk a few steps. Reports a couple of fall with abrasions on arms due to this. Denies head trauma or pain from falls. He also reports associated worsening of LUE coordination and slurred speech with difficulty swallowing.  Patient also reports family is having difficulty supporting him at home. He was seen on 01/23/2022 at [facility] with sister to discuss alternative care options and was referred to clinic social work for placement in long term care facility vs enrollment into [facility] program. In the meantime he reports ongoing difficulties taking care of himself. He became very tearful during exam while discussing home life and states he "does not want to go home". Notes he has not taken any of his medications in the last 2 days as sister threw them away. Reports compliance with medication prior to this. He has not started mirtazapine for depression but feels that his mood  has worsened with recent stressors and is interested in trying this.   Meds:  atorvastatin Xanax  0.5 once a day Plavix Robaxin Nicotine patches Pantoprazole Trazodone for sleep Mirtazipine - has not started  Current Meds  Medication Sig   acetaminophen (TYLENOL) 325 MG tablet Take 1-2 tablets (325-650 mg total) by mouth every 4 (four) hours as needed for mild pain.   ALPRAZolam (XANAX) 0.5 MG tablet Take 1 tablet (0.5 mg total) by mouth 2 (two) times daily as needed for anxiety (for agitation).   atorvastatin (LIPITOR) 80 MG tablet Take 1 tablet (80 mg total) by mouth at bedtime.   bacitracin ointment Apply 1 Application topically 2 (two) times daily.   clopidogrel (PLAVIX) 75 MG tablet Take 1 tablet (75 mg total) by mouth daily.   methocarbamol (ROBAXIN) 500 MG tablet Take 1 tablet (500 mg total) by mouth every 6 (six) hours as needed for muscle spasms.   mirtazapine (REMERON) 15 MG tablet Take 15 mg by mouth at bedtime.   nicotine (NICODERM CQ - DOSED IN MG/24 HOURS) 21 mg/24hr patch Place 1 patch (21 mg total) onto the skin daily.   pantoprazole (PROTONIX) 40 MG tablet Take 1 tablet (40 mg total) by mouth 2 (two) times daily.   traZODone (DESYREL) 150 MG tablet Take 1 tablet (150 mg total) by mouth at bedtime as needed for sleep.   Vitamin D, Ergocalciferol, (DRISDOL) 1.25  MG (50000 UNIT) CAPS capsule Take 1 capsule (50,000 Units total) by mouth every 7 (seven) days.    Past Medical History:  Diagnosis Date   Hypertension     Past Surgical History:  Procedure Laterality Date   BIOPSY  08/23/2021   Procedure: BIOPSY;  Surgeon: [name], MD;  Location: [facility] ENDOSCOPY;  Service: Gastroenterology;;   COLONOSCOPY WITH PROPOFOL N/A 08/23/2021   Procedure: COLONOSCOPY WITH PROPOFOL;  Surgeon: [name], MD;  Location: [place];  Service: Gastroenterology;  Laterality: N/A;   ESOPHAGOGASTRODUODENOSCOPY (EGD) WITH PROPOFOL N/A 08/23/2021   Procedure:  ESOPHAGOGASTRODUODENOSCOPY (EGD) WITH PROPOFOL;  Surgeon: [name], MD;  Location: [place];  Service: Gastroenterology;  Laterality: N/A;   HAND SURGERY Right    POLYPECTOMY  08/23/2021   Procedure: POLYPECTOMY;  Surgeon: [name], MD;  Location: [facility] ENDOSCOPY;  Service: Gastroenterology;;    Social:  Patient lives alone, sister is unable to come by often to help out. Ambulates with a walker. Normally independently of ADLs but more difficulty since recent CVA. Smokes 1 ppd, no alcohol use, smokes mariajuana twice a week.   Family History: Diabetes (sister) denies family history of stroke  Allergies: Allergies as of 01/31/2022   (No Known Allergies)    Review of Systems: A complete ROS was negative except as per HPI.   OBJECTIVE:   Physical Exam: Blood pressure 129/82, pulse (!) 56, temperature 98 F (36.7 C), temperature source Oral, resp. rate 16, SpO2 100 %.  Constitutional: chronically ill appearing male in no acute distress HENT: normocephalic atraumatic, mucous membranes moist Eyes: conjunctiva non-erythematous Neck: supple Cardiovascular: regular rate and rhythm, no m/r/g Pulmonary/Chest: normal work of breathing on room air, lungs clear to auscultation bilaterally Abdominal: soft, non-tender, non-distended MSK: decreased bulk and tone Neurological: alert & oriented x 3, 5/5 strength right upper and lower, 3/5 strength left upper and loer extremities. Dysdiadochokinesis left. Intact finger-to-nose testing, poorly follows commands for ocular testing. Slurring of speech with left sided facial droop, sensation grossly intact. Skin: warm and dry Psych: tearful  Labs: CBC    Component Value Date/Time   WBC 4.9 02/01/2022 0455   RBC 3.27 (L) 02/01/2022 0455   HGB 9.6 (L) 02/01/2022 0455   HCT 29.9 (L) 02/01/2022 0455   PLT 256 02/01/2022 0455   MCV 91.4 02/01/2022 0455   MCH 29.4 02/01/2022 0455   MCHC 32.1 02/01/2022 0455   RDW 14.3 02/01/2022 0455    LYMPHSABS 2.1 01/31/2022 1637   MONOABS 0.4 01/31/2022 1637   EOSABS 0.1 01/31/2022 1637   BASOSABS 0.1 01/31/2022 1637     CMP     Component Value Date/Time   NA 139 02/01/2022 0455   K 3.8 02/01/2022 0455   CL 107 02/01/2022 0455   CO2 28 02/01/2022 0455   GLUCOSE 85 02/01/2022 0455   BUN 25 (H) 02/01/2022 0455   CREATININE 1.76 (H) 02/01/2022 0455   CALCIUM 8.1 (L) 02/01/2022 0455   PROT 7.1 01/31/2022 1637   ALBUMIN 3.5 01/31/2022 1637   AST 18 01/31/2022 1637   ALT 10 01/31/2022 1637   ALKPHOS 82 01/31/2022 1637   BILITOT 0.6 01/31/2022 1637   GFRNONAA 43 (L) 02/01/2022 0455    Imaging: US RENAL  Result Date: 01/31/2022 CLINICAL DATA:  Acute kidney injury EXAM: RENAL / URINARY TRACT ULTRASOUND COMPLETE COMPARISON:  None Available. FINDINGS: Right Kidney: Renal measurements: 11.4 x 5.2 x 5.2 cm = volume: 163 mL. Echogenicity within normal limits. No mass  or hydronephrosis visualized. Left Kidney: Renal measurements: 10.6 x 5.2 x 5.0 cm = volume: 144 mL. Echogenicity within normal limits. There are 2 simple cysts in the left kidney. Superior pole cyst measures 2.4 x 1.9 x 2.0 cm. Inferior pole cyst measures 2.8 x 2.4 x 2.0 cm. Bladder: Appears normal for degree of bladder distention.  Under distended. Other: None. IMPRESSION: 1. No acute abnormality. 2. Left renal simple cysts. Electronically Signed   By: Ronney Asters M.D.   On: 01/31/2022 23:24   MR BRAIN WO CONTRAST  Result Date: 01/31/2022 CLINICAL DATA:  Neuro deficit, acute, stroke suspected. Left-sided weakness and facial droop. EXAM: MRI HEAD WITHOUT CONTRAST TECHNIQUE: Multiplanar, multiecho pulse sequences of the brain and surrounding structures were obtained without intravenous contrast. COMPARISON:  MR head with out contrast 12/28/2021 FINDINGS: Brain: Residual T2 shine through is present in the subacute infarct along the posterior limb of the right internal capsule no acute infarct, hemorrhage, or mass lesion is  present. Study is moderately degraded by patient motion. Asymmetric left periventricular T2 hyperintensities again noted with some volume loss. Moderate generalized atrophy is present. White matter disease is stable. The internal auditory canals are within normal limits. Remote lacunar infarcts are present the posterior cerebellum bilaterally, stable brainstem and cerebellum are otherwise within normal limits. Vascular: Flow is present in the major intracranial arteries. Skull and upper cervical spine: The craniocervical junction is normal. Upper cervical spine is within normal limits. Marrow signal is unremarkable. Sinuses/Orbits: The paranasal sinuses and mastoid air cells are clear. The globes and orbits are within normal limits. IMPRESSION: 1. No acute intracranial abnormality or significant interval change. 2. Expected evolution of subacute infarct involving the posterior limb of the right internal capsule. 3. Stable atrophy and white matter disease. This likely reflects the sequela of chronic microvascular ischemia. Electronically Signed   By: San Morelle M.D.   On: 01/31/2022 18:58   MR ANGIO HEAD WO CONTRAST  Result Date: 01/31/2022 CLINICAL DATA:  Neuro deficit, acute, stroke suspected. Left-sided weakness and facial droop. EXAM: MRA HEAD WITHOUT CONTRAST TECHNIQUE: Angiographic images of the Circle of Willis were acquired using MRA technique without intravenous contrast. COMPARISON:  MRA circle-of-Willis 12/28/2021 FINDINGS: Anterior circulation: Internal carotid arteries are within normal limits from the high cervical segments through the ICA termini bilaterally. The A1 and M1 segments are normal. No definite anterior communicating artery is present. MCA bifurcations are intact. The ACA and MCA branch vessels are normal. Posterior circulation: The rights vertebral artery is dominant. PICA origins are below the field of view. Vertebrobasilar junction and basilar artery are normal. The right  posterior cerebral artery is of scratched at the right posterior cerebral artery originates from the basilar tip with a prominent right posterior communicating artery. The left posterior cerebral artery is of fetal type with a small left P1 segment contributing. PCA branch vessels are within normal limits. Anatomic variants: Fetal type left posterior cerebral artery. Other: None. IMPRESSION: 1. Normal MRA circle-of-Willis without evidence for significant proximal stenosis, aneurysm, or branch vessel occlusion. 2. Fetal type left posterior cerebral artery.  The Electronically Signed   By: San Morelle M.D.   On: 01/31/2022 18:55   CT HEAD CODE STROKE WO CONTRAST  Result Date: 01/31/2022 CLINICAL DATA:  Code stroke.  Neuro deficit, acute, stroke suspected EXAM: CT HEAD WITHOUT CONTRAST TECHNIQUE: Contiguous axial images were obtained from the base of the skull through the vertex without intravenous contrast. RADIATION DOSE REDUCTION: This exam was performed according to  the departmental dose-optimization program which includes automated exposure control, adjustment of the mA and/or kV according to patient size and/or use of iterative reconstruction technique. COMPARISON:  CT head and MRI head December 28 2021. FINDINGS: Brain: Prior left cerebellar and bilateral basal ganglia infarcts. Additional patchy white matter hypoattenuation, nonspecific but compatible with chronic microvascular ischemic disease. No evidence of acute large vascular territory infarct, acute hemorrhage, mass lesion, midline shift low or acute hemorrhage. Vascular: No hyperdense vessel identified. Calcific intracranial atherosclerosis. Skull: No acute fracture. Sinuses/Orbits: Clear visualized sinuses. No acute orbital findings. Other: No mastoid effusions. ASPECTS [facility] Stroke Program Early CT Score) total score (0-10 with 10 being normal): 10. IMPRESSION: 1. No evidence of acute intracranial abnormality. ASPECTS is 10. 2. Remote  infarcts and chronic microvascular ischemic disease. Code stroke imaging results were communicated on 01/31/2022 at 4:53 pm to provider Dr. Erlinda Hong via secure text paging. Electronically Signed   By: Margaretha Sheffield M.D.   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 Discharged from [place] on 01/06/2022.  MRI without acute intracranial changes, evolution of subacute R internal capsule infarct. MRA unremarkable. Has been off Plavix and statin for 2 days. Family stressors may be contributing to worsening, may also have labile BPs as amlodipine discontinued 1 week ago at [facility] for hypotension.  - restarted on plavix and atorvastatin - follow up lipid panel and A1c - would benefit from continued counseling for tobacco cessation - SLP consulted for worsened dysphagia, required dysphagia 2 diet at last admission - PT/OT consulted - Poor family support, will need to consult TOC after PT/OT evaluation - Neurology consulted, appreciate recommendations  # AKI Elevated creatine to 2.3, baseline 1.3. Patient reports occasional retention. Renal US without obstruction or hydroureter. Post void 30m. Suspect prerenal 2/2 poor PO intake and worsening dysphagia. 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 History of GI bleed in January 2023. 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  Colonoscopy with 10 mm tubular adenoma removed and diverticulosis. Iron studies at that time with Iron 56, TIBC 195, saturation 29%, ferritin 306.  No signs of acute bleeding today. - Continue Protonix 40 mg daily - Iron studies - monitor CBC, transfuse for hgb<7  # HTN Remains normotensive,  hold amlodipine  # Depression/anxiety - started on Mirtazapine.  - uses xanax PRN - trazodone for sleep  # Tobacco use - nicotine patch  Diet:  NPO pending SLP VTE: Xarelto IVF: LR 500 mL bolus Code: Full  Prior to Admission Living Arrangement: Home Anticipated Discharge Location: Pending PT/ OT Barriers to Discharge: Placement  Dispo: Admit patient to Observation with expected length of stay less  than 2 midnights.  Delene Ruffini, MD Internal Medicine Resident PGY-1 Pager: [phone]  02/01/2022, 6:41 AM

## 2022-01-31 NOTE — ED Provider Triage Note (Signed)
Emergency Medicine Provider Triage Evaluation Note  Loc Feinstein , a 61 y.o. male  was evaluated in triage.  Pt has history of CVA as recent as 1 month ago and is currently on Plavix.  He has residual functional deficits Including left lower extremity weakness, left facial droop and left upper extremity weakness.  Today he complains of worsening weakness of the LLE.  He states that he first felt symptoms yesterday with increasing weakness of his left lower extremity.  He has told this provider and RN that his symptoms resolved and then restarted again 2 hours ago with increasing weakness of the left lower extremity.  He told another provider and Dr. Campbell Stall that his symptoms have been constant since yesterday.  There does appear to be some slight confusion for patient about when exactly symptoms started, however given that he has told multiple people that his symptoms resolved and then returned 2 hours ago, code stroke is activated.  Review of Systems  Positive:  Negative: Chest pain, shortness of breath, nausea, vomiting, diarrhea  Physical Exam  BP 105/74 (BP Location: Left Arm)   Pulse 87   Temp 98 F (36.7 C) (Oral)   Resp 18   SpO2 100%  Gen:   Awake, no distress, mumbles words with slurred speech   Resp:  Normal effort  MSK:   Moves extremities without difficulty  Other:   Able to follow commands Diminished grip strength of the left compared to right No pronator drift   Medical Decision Making  Medically screening exam initiated at 4:33 PM.  Appropriate orders placed.  Sladen Tolliver was informed that the remainder of the evaluation will be completed by another provider, this initial triage assessment does not replace that evaluation, and the importance of remaining in the ED until their evaluation is complete.  Code stroke activated after evaluation and recommendation of Dr Maryann Alar, Josefina Do, PA-C 01/31/22 1640

## 2022-01-31 NOTE — Progress Notes (Addendum)
Internal Medicine Clinic Attending  I saw and evaluated the patient.  I personally confirmed the key portions of the history and exam documented by Dr.  Sanjuana Mae  and I reviewed pertinent patient test results.  The assessment, diagnosis, and plan were formulated together and I agree with the documentation in the resident's note. Family will look into [facility] and also into Assisted [facility] options.  We will request assistance from our SW for completion of FL2 and further guidance on process.  Mirtazapine is being prescribed for triple indications of depressed mood, difficulty sleeping, and poor appetite.  We suspect that decreased intake has contributed to relatively low blood pressure; risk of fall due to weakness and dizziness in setting of recent stroke deficits outweight need for antihypertensive treatment at this time.

## 2022-01-31 NOTE — Code Documentation (Signed)
Stroke Response Nurse Documentation Code Documentation  Chad Santos is a 61 y.o. male arriving to [facility]  via [employer] on 01-31-2022 with past medical hx of CVA, . On clopidogrel 75 mg daily. Code stroke was activated by ED.   Patient from home where he was LKW at 1430 and now complaining of increased left leg weakness.  He states that he has had mild left side weakness, facial droop and slurred speech since his last stroke.  Today his left leg weakness was significantly worse.  He has since improved.     Stroke team at the bedside on patient arrival. Labs drawn and patient cleared for CT by Dr. Pearline Cables. Patient to CT with team. NIHSS 4, see documentation for details and code stroke times. Patient with disoriented, left facial droop, left leg weakness, and dysarthria  on exam. The following imaging was completed:  CT Head. Patient is not a candidate for IV Thrombolytic due to rapidly improving symptoms. Patient is not a candidate for IR due to no LVO suspected.   Care Plan: mNIHSS and VS q 2 hours.   Bedside handoff with ED RN Benito Mccreedy.    Raliegh Ip  Stroke Response RN

## 2022-01-31 NOTE — ED Notes (Signed)
Patient transported to MRI 

## 2022-01-31 NOTE — ED Notes (Signed)
Pt returned from MRI °

## 2022-01-31 NOTE — Consult Note (Signed)
Stroke Neurology Consultation Note  Consult Requested by: Dr. Regenia Skeeter  Reason for Consult: code stroke  Consult Date: 01/31/22   The history was obtained from the pt and RN.  During history and examination, all items were not able to obtain unless otherwise noted.  History of Present Illness:  Chad Santos is a 61 y.o. African American male with PMH of hypertension, GI bleeding, stroke last month presented to ED for code stroke.  Patient not a good historian, he states that he called his friend who brought him to the hospital due to concern for stroke.  There is no friend or family at bedside to request history.  He stated that he lives at home by himself, he was trying to find somebody to watch him around the clock but not able to.  He had 2 falls for last 2 days, has some laceration at forehead, right arm and left foot.  Initially he said 2 hours ago he felt left leg weakness but later on he said yesterday he started to have left leg weakness.  He still has left facial droop and left arm weakness from stroke last week, but the left leg weakness was new.  However, on exam, BLE seemed symmetrical. Per chart, at discharge last months with stroke, he had left upper and lower extremity mild weakness.  He does have confusion, inconsistent of his answers regarding timing of weakness, new or old symptoms, and the time of last stroke.   In 12/2021 patient admitted for left-sided weakness, left facial droop and dysarthria, found to have right internal capsule small infarct, likely small vessel disease.  Discharged on DAPT for 3 weeks and then monotherapy.  Educated on quit smoking.    He had GI bleeding in January, underwent EGD showed Mallory-Weiss tear, nonbleeding gastric ulcer and stigmata of bleeding.  Hemoglobin stable on Protonix.  LSN: unclear, 2h vs. yesterday tPA Given: No: mild residue symptoms, recent stroke IR: no, low NIHSS, no LVO sign mRS: 2  Past Medical History:  Diagnosis Date    Hypertension     Past Surgical History:  Procedure Laterality Date   BIOPSY  08/23/2021   Procedure: BIOPSY;  Surgeon: [name], MD;  Location: [facility] ENDOSCOPY;  Service: Gastroenterology;;   COLONOSCOPY WITH PROPOFOL N/A 08/23/2021   Procedure: COLONOSCOPY WITH PROPOFOL;  Surgeon: [name], MD;  Location: [facility] ENDOSCOPY;  Service: Gastroenterology;  Laterality: N/A;   ESOPHAGOGASTRODUODENOSCOPY (EGD) WITH PROPOFOL N/A 08/23/2021   Procedure: ESOPHAGOGASTRODUODENOSCOPY (EGD) WITH PROPOFOL;  Surgeon: [name], MD;  Location: [facility];  Service: Gastroenterology;  Laterality: N/A;   HAND SURGERY Right    POLYPECTOMY  08/23/2021   Procedure: POLYPECTOMY;  Surgeon: [name], MD;  Location: [facility] ENDOSCOPY;  Service: Gastroenterology;;    Family History  Problem Relation Age of Onset   Colon cancer Neg Hx    Esophageal cancer Neg Hx    Rectal cancer Neg Hx    Stomach cancer Neg Hx     Social History:  reports that he has been smoking cigarettes. He has a 37.00 pack-year smoking history. He has never used smokeless tobacco. He reports that he does not currently use alcohol. He reports that he does not use drugs.  Allergies: No Known Allergies  No current facility-administered medications on file prior to encounter.   Current Outpatient Medications on File Prior to Encounter  Medication Sig Dispense Refill   acetaminophen (TYLENOL) 325 MG tablet Take 1-2 tablets (325-650 mg total) by mouth  every 4 (four) hours as needed for mild pain.     ALPRAZolam (XANAX) 0.5 MG tablet Take 1 tablet (0.5 mg total) by mouth 2 (two) times daily as needed for anxiety (for agitation). 14 tablet 0   atorvastatin (LIPITOR) 80 MG tablet Take 1 tablet (80 mg total) by mouth at bedtime. 30 tablet 0   bacitracin ointment Apply 1 Application topically 2 (two) times daily. 120 g 0   clopidogrel (PLAVIX) 75 MG tablet Take 1 tablet (75 mg total) by mouth daily. 30 tablet 0   methocarbamol  (ROBAXIN) 500 MG tablet Take 1 tablet (500 mg total) by mouth every 6 (six) hours as needed for muscle spasms. 30 tablet 0   nicotine (NICODERM CQ - DOSED IN MG/24 HOURS) 21 mg/24hr patch Place 1 patch (21 mg total) onto the skin daily. 28 patch 0   pantoprazole (PROTONIX) 40 MG tablet Take 1 tablet (40 mg total) by mouth 2 (two) times daily. 60 tablet 3   traZODone (DESYREL) 150 MG tablet Take 1 tablet (150 mg total) by mouth at bedtime as needed for sleep. 30 tablet 2   Vitamin D, Ergocalciferol, (DRISDOL) 1.25 MG (50000 UNIT) CAPS capsule Take 1 capsule (50,000 Units total) by mouth every 7 (seven) days. 4 capsule 0    Review of Systems: A full ROS was attempted today and was not able to be performed due to confusion  Physical Examination: Temp:  [98 F (36.7 C)] 98 F (36.7 C) (07/11 1625) Pulse Rate:  [64-87] 64 (07/11 1700) Resp:  [18-25] 25 (07/11 1700) BP: (105-114)/(71-74) 114/71 (07/11 1700) SpO2:  [100 %] 100 % (07/11 1625)  General - well nourished, well developed, in no apparent distress.    Ophthalmologic - fundi not visualized due to noncooperation.    Cardiovascular - regular rhythm and rate  Neuro - awake, alert, eyes open, orientated to age, place, year but not to month, stated June instead of July. No aphasia, paucity of speech, but following all simple commands. Able to name and repeat but mild to moderate dysarthria. No gaze palsy, tracking bilaterally, visual field full.  Left facial droop. Tongue midline. RUE and RLE 5/5, LUE and LLE slight drift. Sensation symmetrical bilaterally, b/l FTN grossly intact, gait not tested.   NIH Stroke Scale  Level Of Consciousness 0=Alert; keenly responsive 1=Arouse to minor stimulation 2=Requires repeated stimulation to arouse or movements to pain 3=postures or unresponsive 0  LOC Questions to Month and Age 66=Answers both questions correctly 1=Answers one question correctly or dysarthria/intubated/trauma/language  barrier 2=Answers neither question correctly or aphasia 1  LOC Commands      -Open/Close eyes     -Open/close grip     -Pantomime commands if communication barrier 0=Performs both tasks correctly 1=Performs one task correctly 2=Performs neighter task correctly 0  Best Gaze     -Only assess horizontal gaze 0=Normal 1=Partial gaze palsy 2=Forced deviation, or total gaze paresis 0  Visual 0=No visual loss 1=Partial hemianopia 2=Complete hemianopia 3=Bilateral hemianopia (blind including cortical blindness) 0  Facial Palsy     -Use grimace if obtunded 0=Normal symmetrical movement 1=Minor paralysis (asymmetry) 2=Partial paralysis (lower face) 3=Complete paralysis (upper and lower face) 1  Motor  0=No drift for 10/5 seconds 1=Drift, but does not hit bed 2=Some antigravity effort, hits  bed 3=No effort against gravity, limb falls 4=No movement 0=Amputation/joint fusion Right Arm 0     Leg 0    Left Arm 0     Leg 1  Limb  Ataxia     - FNT/HTS 0=Absent or does not understand or paralyzed or amputation/joint fusion 1=Present in one limb 2=Present in two limbs 0  Sensory 0=Normal 1=Mild to moderate sensory loss 2=Severe to total sensory loss or coma/unresponsive 0  Best Language 0=No aphasia, normal 1=Mild to moderate aphasia 2=Severe aphasia 3=Mute, global aphasia, or coma/unresponsive 0  Dysarthria 0=Normal 1=Mild to moderate 2=Severe, unintelligible or mute/anarthric 0=intubated/unable to test 1  Extinction/Neglect 0=No abnormality 1=visual/tactile/auditory/spatia/personal inattention/Extinction to bilateral simultaneous stimulation 2=Profound neglect/extinction more than 1 modality  0  Total   4      Data Reviewed: CT HEAD CODE STROKE WO CONTRAST  Result Date: 01/31/2022 CLINICAL DATA:  Code stroke.  Neuro deficit, acute, stroke suspected EXAM: CT HEAD WITHOUT CONTRAST TECHNIQUE: Contiguous axial images were obtained from the base of the skull through the vertex  without intravenous contrast. RADIATION DOSE REDUCTION: This exam was performed according to the departmental dose-optimization program which includes automated exposure control, adjustment of the mA and/or kV according to patient size and/or use of iterative reconstruction technique. COMPARISON:  CT head and MRI head December 28 2021. FINDINGS: Brain: Prior left cerebellar and bilateral basal ganglia infarcts. Additional patchy white matter hypoattenuation, nonspecific but compatible with chronic microvascular ischemic disease. No evidence of acute large vascular territory infarct, acute hemorrhage, mass lesion, midline shift low or acute hemorrhage. Vascular: No hyperdense vessel identified. Calcific intracranial atherosclerosis. Skull: No acute fracture. Sinuses/Orbits: Clear visualized sinuses. No acute orbital findings. Other: No mastoid effusions. ASPECTS [facility] Stroke Program Early CT Score) total score (0-10 with 10 being normal): 10. IMPRESSION: 1. No evidence of acute intracranial abnormality. ASPECTS is 10. 2. Remote infarcts and chronic microvascular ischemic disease. Code stroke imaging results were communicated on 01/31/2022 at 4:53 pm to provider Dr. Erlinda Hong via secure text paging. Electronically Signed   By: Margaretha Sheffield M.D.   On: 01/31/2022 16:53   DG Abd 2 Views  Result Date: 01/27/2022 CLINICAL DATA:  Constipation.  Tenesmus. EXAM: ABDOMEN - 2 VIEW COMPARISON:  None Available. FINDINGS: Prominent stool throughout the colon favors constipation. No abnormal air-fluid levels. Severe bilateral degenerative hip arthropathy. Iliac artery atherosclerotic vascular calcifications. IMPRESSION: 1.  Prominent stool throughout the colon favors constipation. 2. Severe bilateral degenerative hip arthropathy. 3. Atherosclerosis. Electronically Signed   By: Van Clines M.D.   On: 01/27/2022 12:35    Assessment: 61 y.o. male hypertension, GI bleeding, stroke last month presented to ED for left leg  weakness. However, time of onset not clear as patient not a good historian and seems to be confused.  It could be 2 hours ago versus yesterday.  NIH score 4, on exam, only slight drift of left lower extremity, no significant weakness.  Still has residual left facial droop and dysarthria.  CT stat no acute abnormality.  Patient not tPA candidate given unclear time onset, mild residual symptoms, recent stroke.  Not IR candidate given low NIH score and no LVO sign.  We will continue stroke work-up with MRI and MRA to rule out new stroke.  He had a falls in the last 2 days with a laceration, no need PT therapy reevaluation.  Continue antiplatelet and statin.  Stroke Risk Factors - hyperlipidemia, hypertension, smoking, and recent stroke  Plan: Recommend intermittent admission for further stroke work up  Frequent neuro checks Telemetry monitoring MRI brain  MRA head with carotid doppler UDS, fasting lipid panel and HgbA1C PT/OT/speech consult given recent fall at home Permissive hypertension (only treat if BP >  220/120 unless a lower blood pressure is clinically necessary) for 24-48 hours post stroke onset GI and DVT prophylaxis  Continue Lipitor and Plavix for now Discussed with Dr. Regenia Skeeter ED physician We will follow   Thank you for this consultation and allowing [place] to participate in the care of this patient.  Rosalin Hawking, MD PhD Stroke Neurology 01/31/2022 5:45 PM

## 2022-02-01 ENCOUNTER — Other Ambulatory Visit: Payer: Self-pay

## 2022-02-01 ENCOUNTER — Encounter (HOSPITAL_COMMUNITY): Payer: Self-pay | Admitting: Internal Medicine

## 2022-02-01 ENCOUNTER — Observation Stay (HOSPITAL_COMMUNITY): Payer: Medicaid Other

## 2022-02-01 DIAGNOSIS — Z608 Other problems related to social environment: Secondary | ICD-10-CM | POA: Diagnosis present

## 2022-02-01 DIAGNOSIS — F17210 Nicotine dependence, cigarettes, uncomplicated: Secondary | ICD-10-CM | POA: Diagnosis present

## 2022-02-01 DIAGNOSIS — G9349 Other encephalopathy: Secondary | ICD-10-CM | POA: Diagnosis present

## 2022-02-01 DIAGNOSIS — I69354 Hemiplegia and hemiparesis following cerebral infarction affecting left non-dominant side: Secondary | ICD-10-CM | POA: Diagnosis not present

## 2022-02-01 DIAGNOSIS — Z8673 Personal history of transient ischemic attack (TIA), and cerebral infarction without residual deficits: Secondary | ICD-10-CM

## 2022-02-01 DIAGNOSIS — W19XXXA Unspecified fall, initial encounter: Secondary | ICD-10-CM

## 2022-02-01 DIAGNOSIS — F32A Depression, unspecified: Secondary | ICD-10-CM | POA: Diagnosis present

## 2022-02-01 DIAGNOSIS — I10 Essential (primary) hypertension: Secondary | ICD-10-CM | POA: Diagnosis present

## 2022-02-01 DIAGNOSIS — Z5948 Other specified lack of adequate food: Secondary | ICD-10-CM | POA: Diagnosis not present

## 2022-02-01 DIAGNOSIS — E785 Hyperlipidemia, unspecified: Secondary | ICD-10-CM | POA: Diagnosis present

## 2022-02-01 DIAGNOSIS — I679 Cerebrovascular disease, unspecified: Secondary | ICD-10-CM

## 2022-02-01 DIAGNOSIS — K279 Peptic ulcer, site unspecified, unspecified as acute or chronic, without hemorrhage or perforation: Secondary | ICD-10-CM | POA: Diagnosis present

## 2022-02-01 DIAGNOSIS — R531 Weakness: Secondary | ICD-10-CM

## 2022-02-01 DIAGNOSIS — I69391 Dysphagia following cerebral infarction: Secondary | ICD-10-CM | POA: Diagnosis not present

## 2022-02-01 DIAGNOSIS — F17200 Nicotine dependence, unspecified, uncomplicated: Secondary | ICD-10-CM | POA: Diagnosis not present

## 2022-02-01 DIAGNOSIS — E7800 Pure hypercholesterolemia, unspecified: Secondary | ICD-10-CM | POA: Diagnosis not present

## 2022-02-01 DIAGNOSIS — Z79899 Other long term (current) drug therapy: Secondary | ICD-10-CM | POA: Diagnosis not present

## 2022-02-01 DIAGNOSIS — K2270 Barrett's esophagus without dysplasia: Secondary | ICD-10-CM | POA: Diagnosis present

## 2022-02-01 DIAGNOSIS — R29810 Facial weakness: Secondary | ICD-10-CM | POA: Diagnosis present

## 2022-02-01 DIAGNOSIS — N179 Acute kidney failure, unspecified: Secondary | ICD-10-CM | POA: Diagnosis present

## 2022-02-01 DIAGNOSIS — D509 Iron deficiency anemia, unspecified: Secondary | ICD-10-CM | POA: Diagnosis present

## 2022-02-01 DIAGNOSIS — G459 Transient cerebral ischemic attack, unspecified: Secondary | ICD-10-CM | POA: Diagnosis not present

## 2022-02-01 DIAGNOSIS — F419 Anxiety disorder, unspecified: Secondary | ICD-10-CM | POA: Diagnosis present

## 2022-02-01 DIAGNOSIS — E860 Dehydration: Secondary | ICD-10-CM | POA: Diagnosis present

## 2022-02-01 DIAGNOSIS — G4700 Insomnia, unspecified: Secondary | ICD-10-CM | POA: Diagnosis present

## 2022-02-01 LAB — IRON AND TIBC
Iron: 76 ug/dL (ref 45–182)
Saturation Ratios: 43 % — ABNORMAL HIGH (ref 17.9–39.5)
TIBC: 176 ug/dL — ABNORMAL LOW (ref 250–450)
UIBC: 100 ug/dL

## 2022-02-01 LAB — LIPID PANEL
Cholesterol: 143 mg/dL (ref 0–200)
HDL: 47 mg/dL (ref >40)
LDL Cholesterol: 81 mg/dL (ref 0–99)
Total CHOL/HDL Ratio: 3 RATIO
Triglycerides: 74 mg/dL (ref <150)
VLDL: 15 mg/dL (ref 0–40)

## 2022-02-01 LAB — CBC
HCT: 29.9 % — ABNORMAL LOW (ref 39.0–52.0)
HCT: 33.8 % — ABNORMAL LOW (ref 39.0–52.0)
Hemoglobin: 9.6 g/dL — ABNORMAL LOW (ref 13.0–17.0)
Hemoglobin: 11 g/dL — ABNORMAL LOW (ref 13.0–17.0)
MCH: 29.4 pg (ref 26.0–34.0)
MCH: 29.3 pg (ref 26.0–34.0)
MCHC: 32.1 g/dL (ref 30.0–36.0)
MCHC: 32.5 g/dL (ref 30.0–36.0)
MCV: 91.4 fL (ref 80.0–100.0)
MCV: 89.9 fL (ref 80.0–100.0)
Platelets: 256 10*3/uL (ref 150–400)
Platelets: 281 10*3/uL (ref 150–400)
RBC: 3.27 MIL/uL — ABNORMAL LOW (ref 4.22–5.81)
RBC: 3.76 MIL/uL — ABNORMAL LOW (ref 4.22–5.81)
RDW: 14.3 % (ref 11.5–15.5)
RDW: 14.1 % (ref 11.5–15.5)
WBC: 4.9 10*3/uL (ref 4.0–10.5)
WBC: 4.5 10*3/uL (ref 4.0–10.5)
nRBC: 0 % (ref 0.0–0.2)
nRBC: 0 % (ref 0.0–0.2)

## 2022-02-01 LAB — BASIC METABOLIC PANEL
Anion gap: 4 — ABNORMAL LOW (ref 5–15)
BUN: 25 mg/dL — ABNORMAL HIGH (ref 8–23)
CO2: 28 mmol/L (ref 22–32)
Calcium: 8.1 mg/dL — ABNORMAL LOW (ref 8.9–10.3)
Chloride: 107 mmol/L (ref 98–111)
Creatinine, Ser: 1.76 mg/dL — ABNORMAL HIGH (ref 0.61–1.24)
GFR, Estimated: 43 mL/min — ABNORMAL LOW (ref >60)
Glucose, Bld: 85 mg/dL (ref 70–99)
Potassium: 3.8 mmol/L (ref 3.5–5.1)
Sodium: 139 mmol/L (ref 135–145)

## 2022-02-01 LAB — HEMOGLOBIN A1C
Hgb A1c MFr Bld: 6.2 % — ABNORMAL HIGH (ref 4.8–5.6)
Mean Plasma Glucose: 131.24 mg/dL

## 2022-02-01 LAB — PROTIME-INR
INR: 1.4 — ABNORMAL HIGH (ref 0.8–1.2)
Prothrombin Time: 16.7 seconds — ABNORMAL HIGH (ref 11.4–15.2)

## 2022-02-01 LAB — APTT: aPTT: 45 seconds — ABNORMAL HIGH (ref 24–36)

## 2022-02-01 LAB — FERRITIN: Ferritin: 200 ng/mL (ref 24–336)

## 2022-02-01 MED ORDER — PANTOPRAZOLE SODIUM 40 MG PO TBEC
40.0000 mg | DELAYED_RELEASE_TABLET | Freq: Every day | ORAL | Status: DC
  Administered 2022-02-01 – 2022-02-03 (×3): 40 mg via ORAL
  Filled 2022-02-01 (×3): qty 1

## 2022-02-01 NOTE — ED Notes (Signed)
ED TO INPATIENT HANDOFF REPORT  ED Nurse Name and Phone #: Casey Burkitt [phone]  S Name/Age/Gender Chad Santos 61 y.o. male Room/Bed: 043C/043C  Code Status   Code Status: Full Code  Home/SNF/Other Home Patient oriented to: self, place, time, and situation Is this baseline? Yes   Triage Complete: Triage complete  Chief Complaint Left-sided weakness [R53.1]  Triage Note Pt reports weakness to his left leg. He reports he had a stroke one month ago which caused residual left sided weakness, facial droop and slurred speech. He states his only new symptom is increased weakness to his left leg. + drift. When asked when it started he stated two hour ago, when asked again he stated it started last night. When asked a third time he stated it started yesterday.   Allergies No Known Allergies  Level of Care/Admitting Diagnosis ED Disposition     ED Disposition  Admit   Condition  --   Comment  Hospital Area: [facility] [[id number]]  Level of Care: Med-Surg [16]  May admit patient to [name] or [facility] if equivalent level of care is available:: No  Covid Evaluation: Asymptomatic - no recent exposure (last 10 days) testing not required  Diagnosis: Left-sided weakness [[id number]]  Admitting Physician: Charise Killian [[id number]]  Attending Physician: Charise Killian [[id number]]  Certification:: I certify this patient will need inpatient services for at least 2 midnights  Estimated Length of Stay: 2          B Medical/Surgery History Past Medical History:  Diagnosis Date   Hypertension    Past Surgical History:  Procedure Laterality Date   BIOPSY  08/23/2021   Procedure: BIOPSY;  Surgeon: [name], MD;  Location: [facility] ENDOSCOPY;  Service: Gastroenterology;;   COLONOSCOPY WITH PROPOFOL N/A 08/23/2021   Procedure: COLONOSCOPY WITH PROPOFOL;  Surgeon: [name], MD;  Location: [place];  Service: Gastroenterology;  Laterality: N/A;    ESOPHAGOGASTRODUODENOSCOPY (EGD) WITH PROPOFOL N/A 08/23/2021   Procedure: ESOPHAGOGASTRODUODENOSCOPY (EGD) WITH PROPOFOL;  Surgeon: [name], MD;  Location: [facility];  Service: Gastroenterology;  Laterality: N/A;   HAND SURGERY Right    POLYPECTOMY  08/23/2021   Procedure: POLYPECTOMY;  Surgeon: [name], MD;  Location: [facility] ENDOSCOPY;  Service: Gastroenterology;;     A IV Location/Drains/Wounds Patient Lines/Drains/Airways Status     Active Line/Drains/Airways     Name Placement date Placement time Site Days   Peripheral IV 01/31/22 20 G Right Antecubital 01/31/22  1700  Antecubital  1   External Urinary Catheter 02/01/22  0513  --  less than 1            Intake/Output Last 24 hours  Intake/Output Summary (Last 24 hours) at 02/01/2022 1724 Last data filed at 02/01/2022 0432 Gross per 24 hour  Intake --  Output 900 ml  Net -900 ml    Labs/Imaging Results for orders placed or performed during the hospital encounter of 01/31/22 (from the past 48 hour(s))  CBG monitoring, ED     Status: Abnormal   Collection Time: 01/31/22  4:27 PM  Result Value Ref Range   Glucose-Capillary 129 (H) 70 - 99 mg/dL    Comment: Glucose reference range applies only to samples taken after fasting for at least 8 hours.  Protime-INR     Status: None   Collection Time: 01/31/22  4:37 PM  Result Value Ref Range   Prothrombin Time 13.4 11.4 - 15.2 seconds   INR 1.0 0.8 -  1.2    Comment: (NOTE) INR goal varies based on device and disease states. Performed at [facility], [place] [address] [place], [place] [address]   APTT     Status: None   Collection Time: 01/31/22  4:37 PM  Result Value Ref Range   aPTT 32 24 - 36 seconds    Comment: Performed at [facility] [address], [place], [place] [address]  CBC     Status: Abnormal   Collection Time: 01/31/22  4:37 PM  Result Value Ref Range   WBC 5.6 4.0 - 10.5 K/uL   RBC 3.88 (L) 4.22 - 5.81 MIL/uL   Hemoglobin 11.2  (L) 13.0 - 17.0 g/dL   HCT 35.0 (L) 39.0 - 52.0 %   MCV 90.2 80.0 - 100.0 fL   MCH 28.9 26.0 - 34.0 pg   MCHC 32.0 30.0 - 36.0 g/dL   RDW 14.2 11.5 - 15.5 %   Platelets 301 150 - 400 K/uL   nRBC 0.0 0.0 - 0.2 %    Comment: Performed at [facility], [place] [address], [place], [place] [address]  Differential     Status: None   Collection Time: 01/31/22  4:37 PM  Result Value Ref Range   Neutrophils Relative % 52 %   Neutro Abs 3.0 1.7 - 7.7 K/uL   Lymphocytes Relative 38 %   Lymphs Abs 2.1 0.7 - 4.0 K/uL   Monocytes Relative 7 %   Monocytes Absolute 0.4 0.1 - 1.0 K/uL   Eosinophils Relative 2 %   Eosinophils Absolute 0.1 0.0 - 0.5 K/uL   Basophils Relative 1 %   Basophils Absolute 0.1 0.0 - 0.1 K/uL   Immature Granulocytes 0 %   Abs Immature Granulocytes 0.01 0.00 - 0.07 K/uL    Comment: Performed at [facility] [address] [place], [place] [address]  Comprehensive metabolic panel     Status: Abnormal   Collection Time: 01/31/22  4:37 PM  Result Value Ref Range   Sodium 138 135 - 145 mmol/L   Potassium 4.1 3.5 - 5.1 mmol/L   Chloride 101 98 - 111 mmol/L   CO2 27 22 - 32 mmol/L   Glucose, Bld 130 (H) 70 - 99 mg/dL    Comment: Glucose reference range applies only to samples taken after fasting for at least 8 hours.   BUN 28 (H) 8 - 23 mg/dL   Creatinine, Ser 2.21 (H) 0.61 - 1.24 mg/dL   Calcium 9.1 8.9 - 10.3 mg/dL   Total Protein 7.1 6.5 - 8.1 g/dL   Albumin 3.5 3.5 - 5.0 g/dL   AST 18 15 - 41 U/L   ALT 10 0 - 44 U/L   Alkaline Phosphatase 82 38 - 126 U/L   Total Bilirubin 0.6 0.3 - 1.2 mg/dL   GFR, Estimated 33 (L) >60 mL/min    Comment: (NOTE) Calculated using the CKD-EPI Creatinine Equation (2021)    Anion gap 10 5 - 15    Comment: Performed at [facility] [address], [place], [place] [address]  Ethanol     Status: None   Collection Time: 01/31/22  4:37 PM  Result Value Ref Range   Alcohol, Ethyl (B) <10 <10 mg/dL    Comment:  (NOTE) Lowest detectable limit for serum alcohol is 10 mg/dL.  For medical purposes only. Performed at [facility], [place] [address] [place], [place] [address]   I-stat  chem 8, ED     Status: Abnormal   Collection Time: 01/31/22  4:44 PM  Result Value Ref Range   Sodium 138 135 - 145 mmol/L   Potassium 4.1 3.5 - 5.1 mmol/L   Chloride 102 98 - 111 mmol/L   BUN 28 (H) 8 - 23 mg/dL   Creatinine, Ser 2.30 (H) 0.61 - 1.24 mg/dL   Glucose, Bld 133 (H) 70 - 99 mg/dL    Comment: Glucose reference range applies only to samples taken after fasting for at least 8 hours.   Calcium, Ion 1.12 (L) 1.15 - 1.40 mmol/L   TCO2 27 22 - 32 mmol/L   Hemoglobin 11.6 (L) 13.0 - 17.0 g/dL   HCT 34.0 (L) 39.0 - 52.0 %  Lipid panel     Status: None   Collection Time: 01/31/22  5:00 PM  Result Value Ref Range   Cholesterol 143 0 - 200 mg/dL   Triglycerides 74 <150 mg/dL   HDL 47 >40 mg/dL   Total CHOL/HDL Ratio 3.0 RATIO   VLDL 15 0 - 40 mg/dL   LDL Cholesterol 81 0 - 99 mg/dL    Comment:        Total Cholesterol/HDL:CHD Risk Coronary Heart Disease Risk Table                     Men   Women  1/2 Average Risk   3.4   3.3  Average Risk       5.0   4.4  2 X Average Risk   9.6   7.1  3 X Average Risk  23.4   11.0        Use the calculated Patient Ratio above and the CHD Risk Table to determine the patient's CHD Risk.        ATP III CLASSIFICATION (LDL):  <100     mg/dL   Optimal  100-129  mg/dL   Near or Above                    Optimal  130-159  mg/dL   Borderline  160-189  mg/dL   High  >190     mg/dL   Very High Performed at [facility] [address] [place], [place] [address]   Hemoglobin A1c     Status: Abnormal   Collection Time: 01/31/22  5:00 PM  Result Value Ref Range   Hgb A1c MFr Bld 6.2 (H) 4.8 - 5.6 %    Comment: (NOTE) Pre diabetes:          5.7%-6.4%  Diabetes:              >6.4%  Glycemic control for   <7.0% adults with diabetes    Mean Plasma Glucose  131.24 mg/dL    Comment: Performed at [facility] [address] [place], [place] [address]  Rapid urine drug screen (hospital performed)     Status: Abnormal   Collection Time: 01/31/22 10:34 PM  Result Value Ref Range   Opiates NONE DETECTED NONE DETECTED   Cocaine NONE DETECTED NONE DETECTED   Benzodiazepines NONE DETECTED NONE DETECTED   Amphetamines NONE DETECTED NONE DETECTED   Tetrahydrocannabinol POSITIVE (A) NONE DETECTED   Barbiturates NONE DETECTED NONE DETECTED    Comment: (NOTE) DRUG SCREEN FOR MEDICAL PURPOSES ONLY.  IF CONFIRMATION IS NEEDED FOR ANY PURPOSE, NOTIFY LAB WITHIN 5 DAYS.  LOWEST DETECTABLE LIMITS FOR URINE DRUG SCREEN Drug Class  Cutoff (ng/mL) Amphetamine and metabolites    1000 Barbiturate and metabolites    200 Benzodiazepine                 350 Tricyclics and metabolites     300 Opiates and metabolites        300 Cocaine and metabolites        300 THC                            50 Performed at [facility], [place] [address] [place], [place] [address]   Urinalysis, Complete w Microscopic     Status: Abnormal   Collection Time: 01/31/22 10:34 PM  Result Value Ref Range   Color, Urine YELLOW YELLOW   APPearance HAZY (A) CLEAR   Specific Gravity, Urine 1.019 1.005 - 1.030   pH 7.0 5.0 - 8.0   Glucose, UA NEGATIVE NEGATIVE mg/dL   Hgb urine dipstick NEGATIVE NEGATIVE   Bilirubin Urine NEGATIVE NEGATIVE   Ketones, ur NEGATIVE NEGATIVE mg/dL   Protein, ur 30 (A) NEGATIVE mg/dL   Nitrite NEGATIVE NEGATIVE   Leukocytes,Ua NEGATIVE NEGATIVE   RBC / HPF 0-5 0 - 5 RBC/hpf   WBC, UA 6-10 0 - 5 WBC/hpf   Bacteria, UA RARE (A) NONE SEEN   Squamous Epithelial / LPF 11-20 0 - 5   Mucus PRESENT    Hyaline Casts, UA PRESENT    Non Squamous Epithelial 0-5 (A) NONE SEEN    Comment: Performed at [facility], [place] [address] [place], [place] [address]  CBC     Status: Abnormal   Collection Time: 02/01/22  4:55 AM   Result Value Ref Range   WBC 4.9 4.0 - 10.5 K/uL   RBC 3.27 (L) 4.22 - 5.81 MIL/uL   Hemoglobin 9.6 (L) 13.0 - 17.0 g/dL   HCT 29.9 (L) 39.0 - 52.0 %   MCV 91.4 80.0 - 100.0 fL   MCH 29.4 26.0 - 34.0 pg   MCHC 32.1 30.0 - 36.0 g/dL   RDW 14.3 11.5 - 15.5 %   Platelets 256 150 - 400 K/uL   nRBC 0.0 0.0 - 0.2 %    Comment: Performed at [facility], [place] [address], [place], [place] [address]  Basic metabolic panel     Status: Abnormal   Collection Time: 02/01/22  4:55 AM  Result Value Ref Range   Sodium 139 135 - 145 mmol/L   Potassium 3.8 3.5 - 5.1 mmol/L   Chloride 107 98 - 111 mmol/L   CO2 28 22 - 32 mmol/L   Glucose, Bld 85 70 - 99 mg/dL    Comment: Glucose reference range applies only to samples taken after fasting for at least 8 hours.   BUN 25 (H) 8 - 23 mg/dL   Creatinine, Ser 1.76 (H) 0.61 - 1.24 mg/dL   Calcium 8.1 (L) 8.9 - 10.3 mg/dL   GFR, Estimated 43 (L) >60 mL/min    Comment: (NOTE) Calculated using the CKD-EPI Creatinine Equation (2021)    Anion gap 4 (L) 5 - 15    Comment: Performed at [facility] [address] [place], [place] [address]  Iron and TIBC     Status: Abnormal   Collection Time: 02/01/22  4:55 AM  Result Value Ref Range   Iron 76 45 - 182 ug/dL   TIBC 176 (L) 250 - 450 ug/dL  Saturation Ratios 43 (H) 17.9 - 39.5 %   UIBC 100 ug/dL    Comment: Performed at [facility], [place] [address] [place], [place] [address]  Ferritin     Status: None   Collection Time: 02/01/22  4:55 AM  Result Value Ref Range   Ferritin 200 24 - 336 ng/mL    Comment: Performed at [facility], [place] [address], [place], [place] [address]  CBC     Status: Abnormal   Collection Time: 02/01/22  2:50 PM  Result Value Ref Range   WBC 4.5 4.0 - 10.5 K/uL   RBC 3.76 (L) 4.22 - 5.81 MIL/uL   Hemoglobin 11.0 (L) 13.0 - 17.0 g/dL   HCT 33.8 (L) 39.0 - 52.0 %   MCV 89.9 80.0 - 100.0 fL   MCH 29.3 26.0 - 34.0 pg   MCHC 32.5 30.0 - 36.0 g/dL   RDW  14.1 11.5 - 15.5 %   Platelets 281 150 - 400 K/uL   nRBC 0.0 0.0 - 0.2 %    Comment: Performed at [facility], [place] [address] [place], [place] [address]  Protime-INR     Status: Abnormal   Collection Time: 02/01/22  2:50 PM  Result Value Ref Range   Prothrombin Time 16.7 (H) 11.4 - 15.2 seconds   INR 1.4 (H) 0.8 - 1.2    Comment: (NOTE) INR goal varies based on device and disease states. Performed at [facility], [place] [address] [place], [place] [address]   APTT     Status: Abnormal   Collection Time: 02/01/22  2:50 PM  Result Value Ref Range   aPTT 45 (H) 24 - 36 seconds    Comment:        IF BASELINE aPTT IS ELEVATED, SUGGEST PATIENT RISK ASSESSMENT BE USED TO DETERMINE APPROPRIATE ANTICOAGULANT THERAPY. Performed at [facility], [place] [address], [place], [place] [address]    VAS US CAROTID  Result Date: 02/01/2022 Carotid Arterial Duplex Study Patient Name:  CARY LOTHROP  Date of Exam:   02/01/2022 Medical Rec #: [id number]         Accession #:    [id number] Date of Birth: 1961/02/21          Patient Gender: M Patient Age:   36 years Exam [facility]  [facility] Procedure:      VAS US CAROTID Referring Phys: Cornelius Moras XU --------------------------------------------------------------------------------  Indications:       CVA. Risk Factors:      Current smoker. Comparison Study:  No previous exam noted. Performing Technologist: Bobetta Lime BS, RVT  Examination Guidelines: A complete evaluation includes B-mode imaging, spectral Doppler, color Doppler, and power Doppler as needed of all accessible portions of each vessel. Bilateral testing is considered an integral part of a complete examination. Limited examinations for reoccurring indications may be performed as noted.  Right Carotid Findings: +----------+--------+--------+--------+------------------+------------------+           PSV cm/sEDV cm/sStenosisPlaque DescriptionComments            +----------+--------+--------+--------+------------------+------------------+ CCA Prox  59      21                                intimal thickening +----------+--------+--------+--------+------------------+------------------+ CCA Distal59      21  intimal thickening +----------+--------+--------+--------+------------------+------------------+ ICA Prox  52      16              heterogenous                         +----------+--------+--------+--------+------------------+------------------+ ICA Distal45      21                                                   +----------+--------+--------+--------+------------------+------------------+ ECA       115     18              calcific                             +----------+--------+--------+--------+------------------+------------------+ +----------+--------+-------+----------------+-------------------+           PSV cm/sEDV cmsDescribe        Arm Pressure (mmHG) +----------+--------+-------+----------------+-------------------+ [id number]            Multiphasic, WNL                    +----------+--------+-------+----------------+-------------------+ +---------+--------+--+--------+-+---------+ VertebralPSV cm/s31EDV cm/s8Antegrade +---------+--------+--+--------+-+---------+  Left Carotid Findings: +----------+--------+--------+--------+------------------+------------------+           PSV cm/sEDV cm/sStenosisPlaque DescriptionComments           +----------+--------+--------+--------+------------------+------------------+ CCA Prox  92      22                                                   +----------+--------+--------+--------+------------------+------------------+ CCA Distal60      24                                intimal thickening +----------+--------+--------+--------+------------------+------------------+ ICA Prox  64      20              heterogenous                          +----------+--------+--------+--------+------------------+------------------+ ICA Distal56      21                                                   +----------+--------+--------+--------+------------------+------------------+ ECA       63      14              heterogenous                         +----------+--------+--------+--------+------------------+------------------+ +----------+--------+--------+----------------+-------------------+           PSV cm/sEDV cm/sDescribe        Arm Pressure (mmHG) +----------+--------+--------+----------------+-------------------+ [id number]             Multiphasic, WNL                    +----------+--------+--------+----------------+-------------------+ +---------+--------+--+--------+--+---------+ VertebralPSV cm/s41EDV cm/s12Antegrade +---------+--------+--+--------+--+---------+   Summary: Right Carotid: Velocities in the right  ICA are consistent with a 1-39% stenosis. Left Carotid: Velocities in the left ICA are consistent with a 1-39% stenosis. Vertebrals:  Bilateral vertebral arteries demonstrate antegrade flow. Subclavians: Normal flow hemodynamics were seen in bilateral subclavian              arteries. *See table(s) above for measurements and observations.     Preliminary    US RENAL  Result Date: 01/31/2022 CLINICAL DATA:  Acute kidney injury EXAM: RENAL / URINARY TRACT ULTRASOUND COMPLETE COMPARISON:  None Available. FINDINGS: Right Kidney: Renal measurements: 11.4 x 5.2 x 5.2 cm = volume: 163 mL. Echogenicity within normal limits. No mass or hydronephrosis visualized. Left Kidney: Renal measurements: 10.6 x 5.2 x 5.0 cm = volume: 144 mL. Echogenicity within normal limits. There are 2 simple cysts in the left kidney. Superior pole cyst measures 2.4 x 1.9 x 2.0 cm. Inferior pole cyst measures 2.8 x 2.4 x 2.0 cm. Bladder: Appears normal for degree of bladder distention.  Under distended. Other: None.  IMPRESSION: 1. No acute abnormality. 2. Left renal simple cysts. Electronically Signed   By: Ronney Asters M.D.   On: 01/31/2022 23:24   MR BRAIN WO CONTRAST  Result Date: 01/31/2022 CLINICAL DATA:  Neuro deficit, acute, stroke suspected. Left-sided weakness and facial droop. EXAM: MRI HEAD WITHOUT CONTRAST TECHNIQUE: Multiplanar, multiecho pulse sequences of the brain and surrounding structures were obtained without intravenous contrast. COMPARISON:  MR head with out contrast 12/28/2021 FINDINGS: Brain: Residual T2 shine through is present in the subacute infarct along the posterior limb of the right internal capsule no acute infarct, hemorrhage, or mass lesion is present. Study is moderately degraded by patient motion. Asymmetric left periventricular T2 hyperintensities again noted with some volume loss. Moderate generalized atrophy is present. White matter disease is stable. The internal auditory canals are within normal limits. Remote lacunar infarcts are present the posterior cerebellum bilaterally, stable brainstem and cerebellum are otherwise within normal limits. Vascular: Flow is present in the major intracranial arteries. Skull and upper cervical spine: The craniocervical junction is normal. Upper cervical spine is within normal limits. Marrow signal is unremarkable. Sinuses/Orbits: The paranasal sinuses and mastoid air cells are clear. The globes and orbits are within normal limits. IMPRESSION: 1. No acute intracranial abnormality or significant interval change. 2. Expected evolution of subacute infarct involving the posterior limb of the right internal capsule. 3. Stable atrophy and white matter disease. This likely reflects the sequela of chronic microvascular ischemia. Electronically Signed   By: San Morelle M.D.   On: 01/31/2022 18:58   MR ANGIO HEAD WO CONTRAST  Result Date: 01/31/2022 CLINICAL DATA:  Neuro deficit, acute, stroke suspected. Left-sided weakness and facial droop. EXAM:  MRA HEAD WITHOUT CONTRAST TECHNIQUE: Angiographic images of the Circle of Willis were acquired using MRA technique without intravenous contrast. COMPARISON:  MRA circle-of-Willis 12/28/2021 FINDINGS: Anterior circulation: Internal carotid arteries are within normal limits from the high cervical segments through the ICA termini bilaterally. The A1 and M1 segments are normal. No definite anterior communicating artery is present. MCA bifurcations are intact. The ACA and MCA branch vessels are normal. Posterior circulation: The rights vertebral artery is dominant. PICA origins are below the field of view. Vertebrobasilar junction and basilar artery are normal. The right posterior cerebral artery is of scratched at the right posterior cerebral artery originates from the basilar tip with a prominent right posterior communicating artery. The left posterior cerebral artery is of fetal type with a small left P1 segment contributing. PCA  branch vessels are within normal limits. Anatomic variants: Fetal type left posterior cerebral artery. Other: None. IMPRESSION: 1. Normal MRA circle-of-Willis without evidence for significant proximal stenosis, aneurysm, or branch vessel occlusion. 2. Fetal type left posterior cerebral artery.  The Electronically Signed   By: San Morelle M.D.   On: 01/31/2022 18:55   CT HEAD CODE STROKE WO CONTRAST  Result Date: 01/31/2022 CLINICAL DATA:  Code stroke.  Neuro deficit, acute, stroke suspected EXAM: CT HEAD WITHOUT CONTRAST TECHNIQUE: Contiguous axial images were obtained from the base of the skull through the vertex without intravenous contrast. RADIATION DOSE REDUCTION: This exam was performed according to the departmental dose-optimization program which includes automated exposure control, adjustment of the mA and/or kV according to patient size and/or use of iterative reconstruction technique. COMPARISON:  CT head and MRI head December 28 2021. FINDINGS: Brain: Prior left cerebellar  and bilateral basal ganglia infarcts. Additional patchy white matter hypoattenuation, nonspecific but compatible with chronic microvascular ischemic disease. No evidence of acute large vascular territory infarct, acute hemorrhage, mass lesion, midline shift low or acute hemorrhage. Vascular: No hyperdense vessel identified. Calcific intracranial atherosclerosis. Skull: No acute fracture. Sinuses/Orbits: Clear visualized sinuses. No acute orbital findings. Other: No mastoid effusions. ASPECTS [facility] Stroke Program Early CT Score) total score (0-10 with 10 being normal): 10. IMPRESSION: 1. No evidence of acute intracranial abnormality. ASPECTS is 10. 2. Remote infarcts and chronic microvascular ischemic disease. Code stroke imaging results were communicated on 01/31/2022 at 4:53 pm to provider Dr. Erlinda Hong via secure text paging. Electronically Signed   By: Margaretha Sheffield M.D.   On: 01/31/2022 16:53    Pending Labs Unresulted Labs (From admission, onward)    None       Vitals/Pain Today's Vitals   02/01/22 1445 02/01/22 1500 02/01/22 1530 02/01/22 1637  BP:  (!) 133/96    Pulse: 64 (!) 58    Resp: 18 18    Temp:      TempSrc:      SpO2: 96% 99%    Weight:      Height:      PainSc:   0-No pain 0-No pain    Isolation Precautions No active isolations  Medications Medications  sodium chloride flush (NS) 0.9 % injection 3 mL (3 mLs Intravenous Not Given 01/31/22 1813)  traZODone (DESYREL) tablet 150 mg (150 mg Oral Given 01/31/22 2219)  rivaroxaban (XARELTO) tablet 10 mg (10 mg Oral Given 02/01/22 1008)  nicotine (NICODERM CQ - dosed in mg/24 hours) patch 21 mg (21 mg Transdermal Patch Applied 02/01/22 1010)  atorvastatin (LIPITOR) tablet 80 mg (80 mg Oral Given 01/31/22 2220)  clopidogrel (PLAVIX) tablet 75 mg (75 mg Oral Given 02/01/22 1008)  mirtazapine (REMERON) tablet 15 mg (15 mg Oral Given 02/01/22 0054)  Vitamin D (Ergocalciferol) (DRISDOL) capsule 50,000 Units (50,000 Units Oral Given  02/01/22 0255)  acetaminophen (TYLENOL) tablet 650 mg (650 mg Oral Given 01/31/22 2219)  ALPRAZolam (XANAX) tablet 0.5 mg (0.5 mg Oral Given 02/01/22 1338)  pantoprazole (PROTONIX) EC tablet 40 mg (40 mg Oral Given 02/01/22 1008)  lactated ringers bolus 500 mL (0 mLs Intravenous Stopped 02/01/22 1829)    Mobility walks with person assist Low fall risk   Focused Assessments Neuro Assessment Handoff:  Swallow screen pass? Yes  Cardiac Rhythm: Sinus bradycardia NIH Stroke Scale ( + Modified Stroke Scale Criteria)  Interval: Shift assessment Level of Consciousness (1a.)   : Alert, keenly responsive LOC Questions (1b. )   +: Answers one  question correctly (states the month is June) LOC Commands (1c. )   + : Performs both tasks correctly Best Gaze (2. )  +: Normal Visual (3. )  +: No visual loss Facial Palsy (4. )    : Minor paralysis Motor Arm, Left (5a. )   +: No drift Motor Arm, Right (5b. )   +: No drift Motor Leg, Left (6a. )   +: Drift Motor Leg, Right (6b. )   +: No drift Limb Ataxia (7. ): Absent Sensory (8. )   +: Normal, no sensory loss Best Language (9. )   +: No aphasia Dysarthria (10. ): Mild-to-moderate dysarthria, patient slurs at least some words and, at worst, can be understood with some difficulty Extinction/Inattention (11.)   +: No Abnormality Modified SS Total  +: 2 Complete NIHSS TOTAL: 4 Last date known well: 01/31/22 Last time known well: 1430 Neuro Assessment: Within Defined Limits Neuro Checks:   Initial (01/31/22 1700)  Last Documented NIHSS Modified Score: 2 (02/01/22 1643) Has TPA been given? No If patient is a Neuro Trauma and patient is going to OR before floor call report to [place] nurse: [phone] or [phone]   R Recommendations: See Admitting Provider Note  Report given to:   Additional Notes:

## 2022-02-01 NOTE — Progress Notes (Signed)
Bilateral carotid duplex study completed. Please see CV Proc for preliminary results.  Toney Lizaola BS, RVT 02/01/2022 9:54 AM

## 2022-02-01 NOTE — Evaluation (Signed)
Clinical/Bedside Swallow Evaluation Patient Details  Name: Chad Santos MRN: [id number] Date of Birth: 01/06/1961  Today's Date: 02/01/2022 Time: SLP Start Time (ACUTE ONLY): 8182 SLP Stop Time (ACUTE ONLY): 0729 SLP Time Calculation (min) (ACUTE ONLY): 12 min  Past Medical History:  Past Medical History:  Diagnosis Date   Hypertension    Past Surgical History:  Past Surgical History:  Procedure Laterality Date   BIOPSY  08/23/2021   Procedure: BIOPSY;  Surgeon: [name], MD;  Location: [facility] ENDOSCOPY;  Service: Gastroenterology;;   COLONOSCOPY WITH PROPOFOL N/A 08/23/2021   Procedure: COLONOSCOPY WITH PROPOFOL;  Surgeon: [name], MD;  Location: [place];  Service: Gastroenterology;  Laterality: N/A;   ESOPHAGOGASTRODUODENOSCOPY (EGD) WITH PROPOFOL N/A 08/23/2021   Procedure: ESOPHAGOGASTRODUODENOSCOPY (EGD) WITH PROPOFOL;  Surgeon: [name], MD;  Location: [place];  Service: Gastroenterology;  Laterality: N/A;   HAND SURGERY Right    POLYPECTOMY  08/23/2021   Procedure: POLYPECTOMY;  Surgeon: [name], MD;  Location: [facility] ENDOSCOPY;  Service: Gastroenterology;;   HPI:  Pt is a 61 yo male presenting with LLE weakness and difficulty swallowing. MRI negative for acute changes. 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 Swallow eval in June 2023 recommended Dys 2 diet and thin liquids. Pt noted to have L CN VII and XII weakness, impusivity, and poor awareness, needing assistance for pacing and oral clearance. PMH also includes: HTN; possible Barrett's esophagus; EGD in May 2023 with HH, mallory weiss tear, gastric ulcers    Assessment / Plan / Recommendation  Clinical Impression  Pt's swallowing function appears to be grossly consistent with presentation reported last month, although no seeming quite as impulsive. He does however still have L weakness with anterior loss, for which he needs cues to manage  with a lingual sweep. No overt s/s of aspiration are noted. Pt says he has had increased trouble swallowing since "the day before yesterday" and when asked to elaborate he denies any overt coughing or globus sensation. He says that he is "afraid he will choke." With that in mind, will leave on baseline diet of Dys 2 solids and thin liquids at least for now. Will f/u at least briefly given subjective complaints. SLP Visit Diagnosis: Dysphagia, unspecified (R13.10)    Aspiration Risk  Mild aspiration risk    Diet Recommendation Dysphagia 2 (Fine chop);Thin liquid   Liquid Administration via: Cup;Straw Medication Administration: Whole meds with puree Supervision: Patient able to self feed;Intermittent supervision to cue for compensatory strategies Compensations: Minimize environmental distractions;Slow rate;Small sips/bites Postural Changes: Seated upright at 90 degrees;Remain upright for at least 30 minutes after po intake    Other  Recommendations Oral Care Recommendations: Oral care BID    Recommendations for follow up therapy are one component of a multi-disciplinary discharge planning process, led by the attending physician.  Recommendations may be updated based on patient status, additional functional criteria and insurance authorization.  Follow up Recommendations Skilled nursing-short term rehab (<3 hours/day)      Assistance Recommended at Discharge Frequent or constant Supervision/Assistance  Functional Status Assessment Patient has had a recent decline in their functional status and demonstrates the ability to make significant improvements in function in a reasonable and predictable amount of time.  Frequency and Duration min 2x/week  2 weeks       Prognosis Prognosis for Safe Diet Advancement: Good Barriers to Reach Goals: Cognitive deficits      Swallow Study  General HPI: Pt is a 61 yo male presenting with LLE weakness and difficulty swallowing. MRI negative for acute  changes. 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 Swallow eval in June 2023 recommended Dys 2 diet and thin liquids. Pt noted to have L CN VII and XII weakness, impusivity, and poor awareness, needing assistance for pacing and oral clearance. PMH also includes: HTN; possible Barrett's esophagus; EGD in May 2023 with HH, mallory weiss tear, gastric ulcers Type of Study: Bedside Swallow Evaluation Previous Swallow Assessment: see HPI Diet Prior to this Study: NPO Temperature Spikes Noted: No Respiratory Status: Room air History of Recent Intubation: No Behavior/Cognition: Alert;Cooperative Oral Cavity Assessment: Within Functional Limits Oral Care Completed by SLP: No Oral Cavity - Dentition: Poor condition;Missing dentition Vision: Functional for self-feeding Self-Feeding Abilities: Able to feed self Patient Positioning: Upright in bed Baseline Vocal Quality: Normal (speech dysarthric) Volitional Cough: Weak Volitional Swallow: Able to elicit    Oral/Motor/Sensory Function Overall Oral Motor/Sensory Function:  (L CN VII and CXII weakness, consistent with baseline?)   Ice Chips Ice chips: Not tested   Thin Liquid Thin Liquid: Impaired Presentation: Cup;Self Fed;Straw Pharyngeal  Phase Impairments: Multiple swallows    Nectar Thick Nectar Thick Liquid: Not tested   Honey Thick Honey Thick Liquid: Not tested   Puree Puree: Impaired Presentation: Self Fed;Spoon Oral Phase Impairments: Reduced labial seal Oral Phase Functional Implications: Left anterior spillage   Solid     Solid: Impaired Presentation: Self Fed Oral Phase Functional Implications: Impaired mastication      Osie Bond., M.A. [place] Office [phone]  Secure chat preferred  02/01/2022,8:44 AM

## 2022-02-01 NOTE — Evaluation (Signed)
Physical Therapy Evaluation Patient Details Name: Chad Santos MRN: [id number] DOB: July 27, 1960 Today's Date: 02/01/2022  History of Present Illness  61 y.o. M admitted on 01/31/22 due to L sided weakness. MRI negative. 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  Clinical Impression  Pt admitted secondary to problem above with deficits below. Pt requiring min A for mobility tasks. Notable unsteadiness and functional weakness in LLE. Feel pt is at increased risk for falls and does not have necessary assist at home. Recommending SNF level therapies at d/c to address current mobility deficits. Will continue to follow acutely.        Recommendations for follow up therapy are one component of a multi-disciplinary discharge planning process, led by the attending physician.  Recommendations may be updated based on patient status, additional functional criteria and insurance authorization.  Follow Up Recommendations Skilled nursing-short term rehab (<3 hours/day) Can patient physically be transported by private vehicle: Yes    Assistance Recommended at Discharge Frequent or constant Supervision/Assistance  Patient can return home with the following  A little help with walking and/or transfers;A little help with bathing/dressing/bathroom;Assistance with cooking/housework;Help with stairs or ramp for entrance;Assist for transportation    Equipment Recommendations Rolling walker (2 wheels)  Recommendations for Other Services       Functional Status Assessment Patient has had a recent decline in their functional status and demonstrates the ability to make significant improvements in function in a reasonable and predictable amount of time.     Precautions / Restrictions Precautions Precautions: Fall Precaution Comments: 3 falls within the last month Restrictions Weight Bearing Restrictions: No       Mobility  Bed Mobility Overal bed mobility: Needs Assistance Bed Mobility: Supine to Sit, Sit to Supine     Supine to sit: Min assist Sit to supine: Min guard   General bed mobility comments: assist for trunk elevation to come to sitting    Transfers Overall transfer level: Needs assistance Equipment used: 1 person hand held assist Transfers: Sit to/from Stand Sit to Stand: Min assist, From elevated surface           General transfer comment: Min A for lift assist and steadying to stand.    Ambulation/Gait Ambulation/Gait assistance: Min assist Gait Distance (Feet): 5 Feet Assistive device: 1 person hand held assist Gait Pattern/deviations: Step-through pattern, Decreased stride length, Narrow base of support Gait velocity: Decreased     General Gait Details: Min A for steadying. Functional weakness noted in LLE.  Stairs            Wheelchair Mobility    Modified Rankin (Stroke Patients Only)       Balance Overall balance assessment: Needs assistance Sitting-balance support: Feet supported Sitting balance-Leahy Scale: Fair     Standing balance support: Single extremity supported Standing balance-Leahy Scale: Poor Standing balance comment: Reliant on UE and external support                             Pertinent Vitals/Pain Pain Assessment Pain Assessment: No/denies pain    Home Living Family/patient expects to be discharged to:: Private residence Living Arrangements: Alone   Type of Home: House Home Access: Stairs to enter Entrance Stairs-Rails: Can reach both;Right;Left Entrance Stairs-Number of Steps: 4   Home Layout: One level Home Equipment: Tub bench;BSC/3in1;Grab bars - toilet;Rolling Walker (2 wheels) (reports wheel broke off RW)  Prior Function Prior Level of Function : Needs assist             Mobility Comments: reports using a RW for mobility ADLs Comments: Pt reports sister was assisting with ADL  tasks     Hand Dominance   Dominant Hand: Right    Extremity/Trunk Assessment   Upper Extremity Assessment Upper Extremity Assessment: Defer to OT evaluation    Lower Extremity Assessment Lower Extremity Assessment: LLE deficits/detail;Generalized weakness LLE Deficits / Details: Grossly 3/5 at hip flexors and 4/5 at knee extensors    Cervical / Trunk Assessment Cervical / Trunk Assessment: Kyphotic  Communication   Communication: Expressive difficulties  Cognition Arousal/Alertness: Awake/alert Behavior During Therapy: Flat affect Overall Cognitive Status: No family/caregiver present to determine baseline cognitive functioning                                 General Comments: Pt with slow processing noted and some difficulty rememberior prior level of function.        General Comments General comments (skin integrity, edema, etc.): VSS on RA    Exercises     Assessment/Plan    PT Assessment Patient needs continued PT services  PT Problem List Decreased strength;Decreased activity tolerance;Decreased balance;Decreased mobility;Decreased coordination;Decreased cognition       PT Treatment Interventions DME instruction;Gait training;Stair training;Functional mobility training;Therapeutic activities;Therapeutic exercise;Balance training;Cognitive remediation;Patient/family education    PT Goals (Current goals can be found in the Care Plan section)  Acute Rehab PT Goals Patient Stated Goal: to get stronger PT Goal Formulation: With patient Time For Goal Achievement: 02/15/22 Potential to Achieve Goals: Good    Frequency Min 2X/week     Co-evaluation               AM-PAC PT "6 Clicks" Mobility  Outcome Measure Help needed turning from your back to your side while in a flat bed without using bedrails?: A Little Help needed moving from lying on your back to sitting on the side of a flat bed without using bedrails?: A Little Help needed  moving to and from a bed to a chair (including a wheelchair)?: A Little Help needed standing up from a chair using your arms (e.g., wheelchair or bedside chair)?: A Little Help needed to walk in hospital room?: A Lot Help needed climbing 3-5 steps with a railing? : Total 6 Click Score: 15    End of Session Equipment Utilized During Treatment: Gait belt Activity Tolerance: Patient tolerated treatment well Patient left: in bed;with call bell/phone within reach (on stretcher in ED) Nurse Communication: Mobility status PT Visit Diagnosis: Unsteadiness on feet (R26.81);Muscle weakness (generalized) (M62.81);History of falling (Z91.81);Repeated falls (R29.6)    Time: 5638-7564 PT Time Calculation (min) (ACUTE ONLY): 12 min   Charges:   PT Evaluation $PT Eval Moderate Complexity: 1 Mod          Reuel Derby, PT, DPT  Acute Rehabilitation Services  Office: [phone]   Rudean Hitt 02/01/2022, 1:56 PM

## 2022-02-01 NOTE — ED Notes (Signed)
Changed patient gown help pull patient up in the bed patient is resting with call bell in reach

## 2022-02-01 NOTE — ED Notes (Signed)
Patient asleep in bed, NAD noted. Call light in reach. Cardiac monitor in place.

## 2022-02-01 NOTE — Progress Notes (Addendum)
Hospital day#0 Subjective:   Overnight Events: No acute events overnight. Initially low blood pressures with decreased HR, received a bolus of fluid. Patient had been NPO given dysarthria. SLP evaluated this am and is now on dysphagia 2 diet currently. Hgb continue to downtrend.  Patient is hungry, has not eaten anything since two days ago. Last BM yesterday. No blood in BM. No blood in urine. Weakness is improving. Denies fevers, chills, headache, overall pain, lightheadedness.  Has not been eaten much at home, nobody to cook anything for him. Last meal 2 days ago. Lives alone. Sister helps usually, but he is having problems with her as she is not helping much anymore. Patient unable to get food to his house. Has not been able to access medicines as sister has them and she has not come to visit him. Prior to this, patient was taking medications, including her home antihypertensive. He has not been working with PT.  Objective:  Vital signs in last 24 hours: Vitals:   02/01/22 0530 02/01/22 0600 02/01/22 0630 02/01/22 1123  BP: 112/62 116/90 124/65 (!) 143/95  Pulse: (!) 55 (!) 55 (!) 57 (!) 58  Resp: '16 14 19 20  '$ Temp:    98.1 F (36.7 C)  TempSrc:    Oral  SpO2: 98% 95% 97% 100%  Weight:      Height:       Supplemental O2: Room Air SpO2: 96 % Filed Weights   01/31/22 1637  Weight: 73.6 kg    Physical Exam:  Constitutional:ill-appearing man sitting in bed, in no acute distress HENT: moist mucous membranes Neck: supple Cardiovascular: regular rate and rhythm, no m/r/g Pulmonary/Chest: normal work of breathing on room air, lungs clear to auscultation bilaterally. No wheezing Abdominal: Normal BS, soft, non-tender, non-distended.  MSK: decreased overall bulk and tone. NO Pitting edema. Neurological: alert & oriented x 3, 5/5 strength in R upper and lower extremities, 4/5 in LUE and LLE. Grossly similar sensation bilaterally. Skin: warm and dry Psych: sad mood and depressed  affect    Intake/Output Summary (Last 24 hours) at 02/01/2022 0636 Last data filed at 02/01/2022 0432 Gross per 24 hour  Intake --  Output 900 ml  Net -900 ml   Net IO Since Admission: -900 mL [02/01/22 0636]  Pertinent Labs:    Latest Ref Rng & Units 02/01/2022    4:55 AM 01/31/2022    4:44 PM 01/31/2022    4:37 PM  CBC  WBC 4.0 - 10.5 K/uL 4.9   5.6   Hemoglobin 13.0 - 17.0 g/dL 9.6  11.6  11.2   Hematocrit 39.0 - 52.0 % 29.9  34.0  35.0   Platelets 150 - 400 K/uL 256   301        Latest Ref Rng & Units 02/01/2022    4:55 AM 01/31/2022    4:44 PM 01/31/2022    4:37 PM  CMP  Glucose 70 - 99 mg/dL 85  133  130   BUN 8 - 23 mg/dL '25  28  28   '$ Creatinine 0.61 - 1.24 mg/dL 1.76  2.30  2.21   Sodium 135 - 145 mmol/L 139  138  138   Potassium 3.5 - 5.1 mmol/L 3.8  4.1  4.1   Chloride 98 - 111 mmol/L 107  102  101   CO2 22 - 32 mmol/L 28   27   Calcium 8.9 - 10.3 mg/dL 8.1   9.1   Total Protein 6.5 -  8.1 g/dL   7.1   Total Bilirubin 0.3 - 1.2 mg/dL   0.6   Alkaline Phos 38 - 126 U/L   82   AST 15 - 41 U/L   18   ALT 0 - 44 U/L   10     Imaging: US RENAL  Result Date: 01/31/2022 CLINICAL DATA:  Acute kidney injury EXAM: RENAL / URINARY TRACT ULTRASOUND COMPLETE COMPARISON:  None Available. FINDINGS: Right Kidney: Renal measurements: 11.4 x 5.2 x 5.2 cm = volume: 163 mL. Echogenicity within normal limits. No mass or hydronephrosis visualized. Left Kidney: Renal measurements: 10.6 x 5.2 x 5.0 cm = volume: 144 mL. Echogenicity within normal limits. There are 2 simple cysts in the left kidney. Superior pole cyst measures 2.4 x 1.9 x 2.0 cm. Inferior pole cyst measures 2.8 x 2.4 x 2.0 cm. Bladder: Appears normal for degree of bladder distention.  Under distended. Other: None. IMPRESSION: 1. No acute abnormality. 2. Left renal simple cysts. Electronically Signed   By: Ronney Asters M.D.   On: 01/31/2022 23:24   MR BRAIN WO CONTRAST  Result Date: 01/31/2022 CLINICAL DATA:  Neuro  deficit, acute, stroke suspected. Left-sided weakness and facial droop. EXAM: MRI HEAD WITHOUT CONTRAST TECHNIQUE: Multiplanar, multiecho pulse sequences of the brain and surrounding structures were obtained without intravenous contrast. COMPARISON:  MR head with out contrast 12/28/2021 FINDINGS: Brain: Residual T2 shine through is present in the subacute infarct along the posterior limb of the right internal capsule no acute infarct, hemorrhage, or mass lesion is present. Study is moderately degraded by patient motion. Asymmetric left periventricular T2 hyperintensities again noted with some volume loss. Moderate generalized atrophy is present. White matter disease is stable. The internal auditory canals are within normal limits. Remote lacunar infarcts are present the posterior cerebellum bilaterally, stable brainstem and cerebellum are otherwise within normal limits. Vascular: Flow is present in the major intracranial arteries. Skull and upper cervical spine: The craniocervical junction is normal. Upper cervical spine is within normal limits. Marrow signal is unremarkable. Sinuses/Orbits: The paranasal sinuses and mastoid air cells are clear. The globes and orbits are within normal limits. IMPRESSION: 1. No acute intracranial abnormality or significant interval change. 2. Expected evolution of subacute infarct involving the posterior limb of the right internal capsule. 3. Stable atrophy and white matter disease. This likely reflects the sequela of chronic microvascular ischemia. Electronically Signed   By: San Morelle M.D.   On: 01/31/2022 18:58   MR ANGIO HEAD WO CONTRAST  Result Date: 01/31/2022 CLINICAL DATA:  Neuro deficit, acute, stroke suspected. Left-sided weakness and facial droop. EXAM: MRA HEAD WITHOUT CONTRAST TECHNIQUE: Angiographic images of the Circle of Willis were acquired using MRA technique without intravenous contrast. COMPARISON:  MRA circle-of-Willis 12/28/2021 FINDINGS: Anterior  circulation: Internal carotid arteries are within normal limits from the high cervical segments through the ICA termini bilaterally. The A1 and M1 segments are normal. No definite anterior communicating artery is present. MCA bifurcations are intact. The ACA and MCA branch vessels are normal. Posterior circulation: The rights vertebral artery is dominant. PICA origins are below the field of view. Vertebrobasilar junction and basilar artery are normal. The right posterior cerebral artery is of scratched at the right posterior cerebral artery originates from the basilar tip with a prominent right posterior communicating artery. The left posterior cerebral artery is of fetal type with a small left P1 segment contributing. PCA branch vessels are within normal limits. Anatomic variants: Fetal type left posterior cerebral artery.  Other: None. IMPRESSION: 1. Normal MRA circle-of-Willis without evidence for significant proximal stenosis, aneurysm, or branch vessel occlusion. 2. Fetal type left posterior cerebral artery.  The Electronically Signed   By: San Morelle M.D.   On: 01/31/2022 18:55   CT HEAD CODE STROKE WO CONTRAST  Result Date: 01/31/2022 CLINICAL DATA:  Code stroke.  Neuro deficit, acute, stroke suspected EXAM: CT HEAD WITHOUT CONTRAST TECHNIQUE: Contiguous axial images were obtained from the base of the skull through the vertex without intravenous contrast. RADIATION DOSE REDUCTION: This exam was performed according to the departmental dose-optimization program which includes automated exposure control, adjustment of the mA and/or kV according to patient size and/or use of iterative reconstruction technique. COMPARISON:  CT head and MRI head December 28 2021. FINDINGS: Brain: Prior left cerebellar and bilateral basal ganglia infarcts. Additional patchy white matter hypoattenuation, nonspecific but compatible with chronic microvascular ischemic disease. No evidence of acute large vascular territory  infarct, acute hemorrhage, mass lesion, midline shift low or acute hemorrhage. Vascular: No hyperdense vessel identified. Calcific intracranial atherosclerosis. Skull: No acute fracture. Sinuses/Orbits: Clear visualized sinuses. No acute orbital findings. Other: No mastoid effusions. ASPECTS [facility] Stroke Program Early CT Score) total score (0-10 with 10 being normal): 10. IMPRESSION: 1. No evidence of acute intracranial abnormality. ASPECTS is 10. 2. Remote infarcts and chronic microvascular ischemic disease. Code stroke imaging results were communicated on 01/31/2022 at 4:53 pm to provider Dr. Erlinda Hong via secure text paging. Electronically Signed   By: Margaretha Sheffield M.D.   On: 01/31/2022 16:53    Assessment/Plan:   Principal Problem:   Left-sided weakness   Patient Summary: Chad Santos is a 61 y.o. 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 Residual dysarthria challenges conversation with him. Weakness has improved since admission. CT without acute abnormality. MRI and MRA without evidence of large vessel occlusion and evidence of expected evolution of subacute infarct involving the posterior limb of the right internal capsule. Neurology consulted; patient not a tPA  or IR candidate. Patient was off of medications for a couple of days, now restarted on  - Neurology following, appreciate recommendations - Bilateral carotid duplex study today, pending results - Continue on clopidogrel 75 mg - Continue Lipitor 80 mg  Acute anemia Chronic Iron deficiency anemia ?Barrett's esophagus Patient with H. Pylori-associated  gastritis, peptic ulcer disease with follow up EGD normal gastric biopsy and intestinal metaplasia in 07/2021, hx of resected tubular adenoma by colonoscopy on 07/2021. Followed in GI clinic with Dr. Thornton Park on 12/2021.  Patient with No evidence of gross blood loss per mouth or rectum. Patient's Hgb on 6/19 12.6. On admission, Hgb 11.2 -> 11.6 ->9.6. Suspect the acute drop in Hgb count was in the setting of IVF bolus. Ferritin 200. TIBC is 176 and saturation ratio is 43% - Repeat CBC, coagulation studies today - Continue pantoprazole - Monitor VS - Consider engaging GI if downtrending Hgb or worsening of vitals - SLP, re-engaged. Patient on Dysphagia 2 diet.  AKI Admission Cr 2.21. Repeat Cr 1.76 after a fluid bolus. Patient back on PO diet. Likely in the setting of poor PO intake - Monitor BMP  Depressed  mood Insomnia Patient with difficulty sleeping at home and depressed mood given socio-economic factors, which include lack of social support and limited access to care, and limitations with ADLs and iADLs in the setting of post-stroke sequelae - Continue on Xanax 0.5 BID PRN for anxiety - Started on Mirtazapine - ED Social work conversation: Patient currently only on Medicaid, no Medicare. Will wait until patient is assigned a floor so this case can be escalated to the floor TOC SW to allow to explore options.  - Awaiting PT and OT recommendations  HLD - Continue on home Lipitor 80 mg  Diet:  Dysphagia 2 diet VTE:  Xarelto ( rivaroxaban) Code: Full TOC recs: ED Social work engaged. Waiting for patient inpatient admission for  Family Update: pending   Dispo: Anticipated discharge pending given difficult social barriers and clinical improvement  Romana Juniper, MD Internal Medicine Resident PGY-1 Please contact the on call pager after 5 pm and on weekends at [phone].

## 2022-02-01 NOTE — Progress Notes (Signed)
STROKE TEAM PROGRESS NOTE   SUBJECTIVE (INTERVAL HISTORY) No family is at the bedside.  Overall his condition is stable. Still flat affect but no acute event overnight and no complains. MRI negative for new stroke. Per RN, pt constantly asking for food and drink, and call for RN every 40mn. He lives alone and he stated that no one can help him at home. He likely have social issues at home. PT/OT recommend SNF. Will consult TOC.    OBJECTIVE Temp:  [98 F (36.7 C)-98.1 F (36.7 C)] 98.1 F (36.7 C) (07/12 1123) Pulse Rate:  [26-87] 58 (07/12 1123) Cardiac Rhythm: Sinus bradycardia (07/11 2330) Resp:  [12-25] 20 (07/12 1123) BP: (105-162)/(62-123) 143/95 (07/12 1123) SpO2:  [56 %-100 %] 100 % (07/12 1123) Weight:  [73.6 kg] 73.6 kg (07/11 1637)  Recent Labs  Lab 01/31/22 1627  GLUCAP 129*   Recent Labs  Lab 01/31/22 1637 01/31/22 1644 02/01/22 0455  NA 138 138 139  K 4.1 4.1 3.8  CL 101 102 107  CO2 27  --  28  GLUCOSE 130* 133* 85  BUN 28* 28* 25*  CREATININE 2.21* 2.30* 1.76*  CALCIUM 9.1  --  8.1*   Recent Labs  Lab 01/31/22 1637  AST 18  ALT 10  ALKPHOS 82  BILITOT 0.6  PROT 7.1  ALBUMIN 3.5   Recent Labs  Lab 01/31/22 1637 01/31/22 1644 02/01/22 0455  WBC 5.6  --  4.9  NEUTROABS 3.0  --   --   HGB 11.2* 11.6* 9.6*  HCT 35.0* 34.0* 29.9*  MCV 90.2  --  91.4  PLT 301  --  256   No results for input(s): "CKTOTAL", "CKMB", "CKMBINDEX", "TROPONINI" in the last 168 hours. Recent Labs    01/31/22 1637  LABPROT 13.4  INR 1.0   Recent Labs    01/31/22 2234  COLORURINE YELLOW  LABSPEC 1.019  PHURINE 7.0  GLUCOSEU NEGATIVE  HGBUR NEGATIVE  BILIRUBINUR NEGATIVE  KETONESUR NEGATIVE  PROTEINUR 30*  NITRITE NEGATIVE  LEUKOCYTESUR NEGATIVE       Component Value Date/Time   CHOL 143 01/31/2022 1700   TRIG 74 01/31/2022 1700   HDL 47 01/31/2022 1700   CHOLHDL 3.0 01/31/2022 1700   VLDL 15 01/31/2022 1700   LDLCALC 81 01/31/2022 1700   Lab  Results  Component Value Date   HGBA1C 6.2 (H) 01/31/2022      Component Value Date/Time   LABOPIA NONE DETECTED 01/31/2022 2234   COCAINSCRNUR NONE DETECTED 01/31/2022 2234   LABBENZ NONE DETECTED 01/31/2022 2234   AMPHETMU NONE DETECTED 01/31/2022 2234   THCU POSITIVE (A) 01/31/2022 2234   LABBARB NONE DETECTED 01/31/2022 2234    Recent Labs  Lab 01/31/22 1637  ETH <10    I have personally reviewed the radiological images below and agree with the radiology interpretations.  VAS [place]  Result Date: 02/01/2022 Carotid Arterial Duplex Study Patient Name:  [name] Date of Exam:   02/01/2022 Medical Rec #: [id number]        Accession #:    [id number] of Birth: 62February 15, 1962         Patient Gender: M Patient Age:   89years [facility] [place] Referring Phys: JCornelius MorasXU --------------------------------------------------------------------------------  Indications:       CVA. Risk Factors:      Current smoker. Comparison Study:  No previous exam noted. Performing Technologist:  Jennifer Redd BS, RVT  Examination Guidelines: A complete evaluation includes B-mode imaging, spectral Doppler, color Doppler, and power Doppler as needed of all accessible portions of each vessel. Bilateral testing is considered an integral part of a complete examination. Limited examinations for reoccurring indications may be performed as noted.  Right Carotid Findings: +----------+--------+--------+--------+------------------+------------------+           PSV cm/sEDV cm/sStenosisPlaque DescriptionComments           +----------+--------+--------+--------+------------------+------------------+ CCA Prox  59      21                                intimal thickening +----------+--------+--------+--------+------------------+------------------+ CCA Distal59      21                                intimal thickening  +----------+--------+--------+--------+------------------+------------------+ ICA Prox  52      16              heterogenous                         +----------+--------+--------+--------+------------------+------------------+ ICA Distal45      21                                                   +----------+--------+--------+--------+------------------+------------------+ ECA       115     18              calcific                             +----------+--------+--------+--------+------------------+------------------+ +----------+--------+-------+----------------+-------------------+           PSV cm/sEDV cmsDescribe        Arm Pressure (mmHG) +----------+--------+-------+----------------+-------------------+ [id number]            Multiphasic, WNL                    +----------+--------+-------+----------------+-------------------+ +---------+--------+--+--------+-+---------+ VertebralPSV cm/s31EDV cm/s8Antegrade +---------+--------+--+--------+-+---------+  Left Carotid Findings: +----------+--------+--------+--------+------------------+------------------+           PSV cm/sEDV cm/sStenosisPlaque DescriptionComments           +----------+--------+--------+--------+------------------+------------------+ CCA Prox  92      22                                                   +----------+--------+--------+--------+------------------+------------------+ CCA Distal60      24                                intimal thickening +----------+--------+--------+--------+------------------+------------------+ ICA Prox  64      20              heterogenous                         +----------+--------+--------+--------+------------------+------------------+ ICA Distal56      21                                                   +----------+--------+--------+--------+------------------+------------------+  ECA       63      14              heterogenous                          +----------+--------+--------+--------+------------------+------------------+ +----------+--------+--------+----------------+-------------------+           PSV cm/sEDV cm/sDescribe        Arm Pressure (mmHG) +----------+--------+--------+----------------+-------------------+ [id number]             Multiphasic, WNL                    +----------+--------+--------+----------------+-------------------+ +---------+--------+--+--------+--+---------+ VertebralPSV cm/s41EDV cm/s12Antegrade +---------+--------+--+--------+--+---------+   Summary: Right Carotid: Velocities in the right ICA are consistent with a 1-39% stenosis. Left Carotid: Velocities in the left ICA are consistent with a 1-39% stenosis. Vertebrals:  Bilateral vertebral arteries demonstrate antegrade flow. Subclavians: Normal flow hemodynamics were seen in bilateral subclavian              arteries. *See table(s) above for measurements and observations.     Preliminary    US RENAL  Result Date: 01/31/2022 CLINICAL DATA:  Acute kidney injury EXAM: RENAL / URINARY TRACT ULTRASOUND COMPLETE COMPARISON:  None Available. FINDINGS: Right Kidney: Renal measurements: 11.4 x 5.2 x 5.2 cm = volume: 163 mL. Echogenicity within normal limits. No mass or hydronephrosis visualized. Left Kidney: Renal measurements: 10.6 x 5.2 x 5.0 cm = volume: 144 mL. Echogenicity within normal limits. There are 2 simple cysts in the left kidney. Superior pole cyst measures 2.4 x 1.9 x 2.0 cm. Inferior pole cyst measures 2.8 x 2.4 x 2.0 cm. Bladder: Appears normal for degree of bladder distention.  Under distended. Other: None. IMPRESSION: 1. No acute abnormality. 2. Left renal simple cysts. Electronically Signed   By: Ronney Asters M.D.   On: 01/31/2022 23:24   MR BRAIN WO CONTRAST  Result Date: 01/31/2022 CLINICAL DATA:  Neuro deficit, acute, stroke suspected. Left-sided weakness and facial droop. EXAM: MRI HEAD WITHOUT CONTRAST  TECHNIQUE: Multiplanar, multiecho pulse sequences of the brain and surrounding structures were obtained without intravenous contrast. COMPARISON:  MR head with out contrast 12/28/2021 FINDINGS: Brain: Residual T2 shine through is present in the subacute infarct along the posterior limb of the right internal capsule no acute infarct, hemorrhage, or mass lesion is present. Study is moderately degraded by patient motion. Asymmetric left periventricular T2 hyperintensities again noted with some volume loss. Moderate generalized atrophy is present. White matter disease is stable. The internal auditory canals are within normal limits. Remote lacunar infarcts are present the posterior cerebellum bilaterally, stable brainstem and cerebellum are otherwise within normal limits. Vascular: Flow is present in the major intracranial arteries. Skull and upper cervical spine: The craniocervical junction is normal. Upper cervical spine is within normal limits. Marrow signal is unremarkable. Sinuses/Orbits: The paranasal sinuses and mastoid air cells are clear. The globes and orbits are within normal limits. IMPRESSION: 1. No acute intracranial abnormality or significant interval change. 2. Expected evolution of subacute infarct involving the posterior limb of the right internal capsule. 3. Stable atrophy and white matter disease. This likely reflects the sequela of chronic microvascular ischemia. Electronically Signed   By: San Morelle M.D.   On: 01/31/2022 18:58   MR ANGIO HEAD WO CONTRAST  Result Date: 01/31/2022 CLINICAL DATA:  Neuro deficit, acute, stroke suspected. Left-sided weakness and facial droop. EXAM: MRA HEAD WITHOUT CONTRAST TECHNIQUE: Angiographic images of the Circle  of Willis were acquired using MRA technique without intravenous contrast. COMPARISON:  MRA circle-of-Willis 12/28/2021 FINDINGS: Anterior circulation: Internal carotid arteries are within normal limits from the high cervical segments through  the ICA termini bilaterally. The A1 and M1 segments are normal. No definite anterior communicating artery is present. MCA bifurcations are intact. The ACA and MCA branch vessels are normal. Posterior circulation: The rights vertebral artery is dominant. PICA origins are below the field of view. Vertebrobasilar junction and basilar artery are normal. The right posterior cerebral artery is of scratched at the right posterior cerebral artery originates from the basilar tip with a prominent right posterior communicating artery. The left posterior cerebral artery is of fetal type with a small left P1 segment contributing. PCA branch vessels are within normal limits. Anatomic variants: Fetal type left posterior cerebral artery. Other: None. IMPRESSION: 1. Normal MRA circle-of-Willis without evidence for significant proximal stenosis, aneurysm, or branch vessel occlusion. 2. Fetal type left posterior cerebral artery.  The Electronically Signed   By: San Morelle M.D.   On: 01/31/2022 18:55   CT HEAD CODE STROKE WO CONTRAST  Result Date: 01/31/2022 CLINICAL DATA:  Code stroke.  Neuro deficit, acute, stroke suspected EXAM: CT HEAD WITHOUT CONTRAST TECHNIQUE: Contiguous axial images were obtained from the base of the skull through the vertex without intravenous contrast. RADIATION DOSE REDUCTION: This exam was performed according to the departmental dose-optimization program which includes automated exposure control, adjustment of the mA and/or kV according to patient size and/or use of iterative reconstruction technique. COMPARISON:  CT head and MRI head December 28 2021. FINDINGS: Brain: Prior left cerebellar and bilateral basal ganglia infarcts. Additional patchy white matter hypoattenuation, nonspecific but compatible with chronic microvascular ischemic disease. No evidence of acute large vascular territory infarct, acute hemorrhage, mass lesion, midline shift low or acute hemorrhage. Vascular: No hyperdense vessel  identified. Calcific intracranial atherosclerosis. Skull: No acute fracture. Sinuses/Orbits: Clear visualized sinuses. No acute orbital findings. Other: No mastoid effusions. ASPECTS [facility] Stroke Program Early CT Score) total score (0-10 with 10 being normal): 10. IMPRESSION: 1. No evidence of acute intracranial abnormality. ASPECTS is 10. 2. Remote infarcts and chronic microvascular ischemic disease. Code stroke imaging results were communicated on 01/31/2022 at 4:53 pm to provider Dr. Erlinda Hong via secure text paging. Electronically Signed   By: Margaretha Sheffield M.D.   On: 01/31/2022 16:53   DG Abd 2 Views  Result Date: 01/27/2022 CLINICAL DATA:  Constipation.  Tenesmus. EXAM: ABDOMEN - 2 VIEW COMPARISON:  None Available. FINDINGS: Prominent stool throughout the colon favors constipation. No abnormal air-fluid levels. Severe bilateral degenerative hip arthropathy. Iliac artery atherosclerotic vascular calcifications. IMPRESSION: 1.  Prominent stool throughout the colon favors constipation. 2. Severe bilateral degenerative hip arthropathy. 3. Atherosclerosis. Electronically Signed   By: Van Clines M.D.   On: 01/27/2022 12:35     PHYSICAL EXAM  Temp:  [98 F (36.7 C)-98.1 F (36.7 C)] 98.1 F (36.7 C) (07/12 1123) Pulse Rate:  [26-87] 58 (07/12 1123) Resp:  [12-25] 20 (07/12 1123) BP: (105-162)/(62-123) 143/95 (07/12 1123) SpO2:  [56 %-100 %] 100 % (07/12 1123) Weight:  [73.6 kg] 73.6 kg (07/11 1637)  General - well nourished, well developed, in no apparent distress.     Ophthalmologic - fundi not visualized due to noncooperation.     Cardiovascular - regular rhythm and rate   Neuro - awake, alert, eyes open, orientated to age, place, year but not to month. No aphasia, paucity of speech, but following all  simple commands. Able to name and repeat but mild to moderate dysarthria. No gaze palsy, tracking bilaterally, visual field full.  Left facial droop. Tongue midline. RUE and RLE 5/5, LUE  and LLE slight drift. Sensation symmetrical bilaterally, b/l FTN grossly intact, gait not tested.    ASSESSMENT/PLAN [name] is a 61 y.o. male hypertension, GI bleeding, stroke last month presented to ED for left leg weakness. No tPA given due to recent stroke and unclear time onset.  Exam close to his baseline.   Recrudescence of stroke symptoms in the setting of encephalopathy  Presented with confusion and subjective right-sided weakness.  On exam showed residual deficit from previous stroke MRI no acute infarct MRA unremarkable Carotid Doppler unremarkable 2D Echo EF 45 to 50% in 12/2021 LDL 81 HgbA1c 6.2 Xarelto '10mg'$  for VTE prophylaxis clopidogrel 75 mg daily prior to admission, now on clopidogrel 75 mg daily.  Continue on discharge Patient counseled to be compliant with his antithrombotic medications Ongoing aggressive stroke risk factor management Therapy recommendations: SNF Disposition: Pending  History of stroke 12/2021 patient admitted for left-sided weakness, left facial droop and dysarthria, found to have right internal capsule small infarct on MRI, likely small vessel disease.  CT head and neck unremarkable.  EF 45 to 50%, LDL 116, A1c 6.1.  Discharged on DAPT for 3 weeks and then Plavix alone.  Educated on quit smoking.   Falls at home Social issue Patient stated that he had 2 falls for last 2 days, resulting frontal, right arm and left foot laceration. He lives alone, no family support. Social worker consult placed PT therapy recommend SNF  Hypertension Stable Long term BP goal normotensive  Hyperlipidemia Home meds: Lipitor 80 LDL 81, goal < 70 Now on Lipitor 80 Continue statin at discharge  Tobacco abuse Current smoker Smoking cessation counseling provided Nicotine patch provided Pt is willing to quit  Other Stroke Risk Factors Substance abuse, UDS THC positive, cessation education provided  Other Active Problems History of GI bleeding  in 07/2021 EGD showed Mallory-Weiss tear, nonbleeding gastric ulcer and stigmata of bleeding.  So far hemoglobin stable on antiplatelet.  Hospital day # 0  Neurology will sign off. Please call with questions. Pt will follow up with stroke clinic NP at [facility] in about 4 weeks. Thanks for the consult.   Rosalin Hawking, MD PhD Stroke Neurology 02/01/2022 12:39 PM    To contact Stroke Continuity provider, please refer to [email or web address] After hours, contact General Neurology

## 2022-02-01 NOTE — ED Notes (Signed)
Post void residual bladder scan completed and showed 65m in bladder.

## 2022-02-01 NOTE — ED Notes (Signed)
Patient's linens changed and patient repositioned in bed. External male catheter placed to suction. Patient denies any further needs at this time. Call light in reach.

## 2022-02-02 DIAGNOSIS — R531 Weakness: Secondary | ICD-10-CM | POA: Diagnosis not present

## 2022-02-02 LAB — BASIC METABOLIC PANEL
Anion gap: 10 (ref 5–15)
BUN: 21 mg/dL (ref 8–23)
CO2: 24 mmol/L (ref 22–32)
Calcium: 9.3 mg/dL (ref 8.9–10.3)
Chloride: 104 mmol/L (ref 98–111)
Creatinine, Ser: 1.48 mg/dL — ABNORMAL HIGH (ref 0.61–1.24)
GFR, Estimated: 53 mL/min — ABNORMAL LOW (ref >60)
Glucose, Bld: 81 mg/dL (ref 70–99)
Potassium: 4.2 mmol/L (ref 3.5–5.1)
Sodium: 138 mmol/L (ref 135–145)

## 2022-02-02 LAB — CBC
HCT: 40.7 % (ref 39.0–52.0)
Hemoglobin: 13.4 g/dL (ref 13.0–17.0)
MCH: 29.1 pg (ref 26.0–34.0)
MCHC: 32.9 g/dL (ref 30.0–36.0)
MCV: 88.5 fL (ref 80.0–100.0)
Platelets: 285 K/uL (ref 150–400)
RBC: 4.6 MIL/uL (ref 4.22–5.81)
RDW: 14.3 % (ref 11.5–15.5)
WBC: 5.7 K/uL (ref 4.0–10.5)
nRBC: 0 % (ref 0.0–0.2)

## 2022-02-02 NOTE — NC FL2 (Signed)
[place] MEDICAID FL2 LEVEL OF CARE SCREENING TOOL     IDENTIFICATION  Patient Name: Chad Santos Birthdate: [birth year] Sex: male Admission Date (Current Location): 01/31/2022  [facility] and [place] Number:  Herbalist and Address:  [facility], [place] [address], [place], [place] [address]      Provider Number: [id number]  Attending Physician Name and Address:  Charise Killian, MD  Relative Name and Phone Number:       Current Level of Care: Hospital Recommended Level of Care: [place] Prior Approval Number:    Date Approved/Denied:   PASRR Number: [id number]  Discharge Plan: SNF    Current Diagnoses: Patient Active Problem List   Diagnosis Date Noted   Left-sided weakness 01/31/2022   Depression 01/23/2022   Vitamin D deficiency 01/23/2022   Hypotension 01/23/2022   Acute ischemic stroke ([place]) 01/01/2022   Acute CVA (cerebrovascular accident) ([place]) 12/28/2021   Upper GI bleeding 08/21/2021   Elevated troponin 08/21/2021   Bilateral leg pain 08/21/2021   Agitation 08/21/2021   Blood in the stool    Normocytic anemia     Orientation RESPIRATION BLADDER Height & Weight     Self, Time, Situation, Place  Normal Continent Weight: 153 lb (69.4 kg) Height:  '6\' 1"'$  (185.4 cm)  BEHAVIORAL SYMPTOMS/MOOD NEUROLOGICAL BOWEL NUTRITION STATUS      Continent Diet (see DC summary)  AMBULATORY STATUS COMMUNICATION OF NEEDS Skin   Limited Assist Verbally Normal                       Personal Care Assistance Level of Assistance  Bathing, Feeding, Dressing Bathing Assistance: Limited assistance Feeding assistance: Limited assistance Dressing Assistance: Limited assistance     Functional Limitations Info             [name]  PT (By licensed PT), OT (By licensed OT)     PT Frequency: 5x/wk OT Frequency: 5x/wk            Contractures Contractures Info: Not present    Additional  Factors Info  Code Status, Allergies, Psychotropic Code Status Info: Full Allergies Info: NKA Psychotropic Info: Remeron '15mg'$  daily at bed; Xanax 0.'5mg'$  2x/day PRN         Current Medications (02/02/2022):  This is the current hospital active medication list Current Facility-Administered Medications  Medication Dose Route Frequency Provider Last Rate Last Admin   acetaminophen (TYLENOL) tablet 650 mg  650 mg Oral Q6H PRN Iona Beard, MD   650 mg at 01/31/22 2219   ALPRAZolam Duanne Moron) tablet 0.5 mg  0.5 mg Oral BID PRN Iona Beard, MD   0.5 mg at 02/02/22 0232   atorvastatin (LIPITOR) tablet 80 mg  80 mg Oral QHS Iona Beard, MD   80 mg at 02/01/22 2111   clopidogrel (PLAVIX) tablet 75 mg  75 mg Oral Daily Iona Beard, MD   75 mg at 02/02/22 0954   mirtazapine (REMERON) tablet 15 mg  15 mg Oral QHS Iona Beard, MD   15 mg at 02/01/22 2112   nicotine (NICODERM CQ - dosed in mg/24 hours) patch 21 mg  21 mg Transdermal Daily Iona Beard, MD   21 mg at 02/02/22 0954   pantoprazole (PROTONIX) EC tablet 40 mg  40 mg Oral Daily Iona Beard, MD   40 mg at 02/02/22 0954   rivaroxaban (XARELTO) tablet 10 mg  10 mg Oral Daily Iona Beard, MD  10 mg at 02/02/22 0954   sodium chloride flush (NS) 0.9 % injection 3 mL  3 mL Intravenous Once Lianne Cure, DO       traZODone (DESYREL) tablet 150 mg  150 mg Oral QHS PRN Iona Beard, MD   150 mg at 02/01/22 2111   Vitamin D (Ergocalciferol) (DRISDOL) capsule 50,000 Units  50,000 Units Oral Q7 days Iona Beard, MD   50,000 Units at 02/01/22 0255     Discharge Medications: Please see discharge summary for a list of discharge medications.  Relevant Imaging Results:  Relevant Lab Results:   Additional Information SS#: [id number]  Geralynn Ochs, LCSW

## 2022-02-02 NOTE — TOC Initial Note (Signed)
Transition of Care [facility]) - Initial/Assessment Note    Patient Details  Name: Chad Santos MRN: [id number] Date of Birth: 1961/02/04  Transition of Care [facility]) CM/SW Contact:    Geralynn Ochs, LCSW Phone Number: 02/02/2022, 1:47 PM  Clinical Narrative:      CSW met with patient to discuss recommendation for SNF placement, and patient in agreement. CSW explained potential barriers for placement with patient's Medicaid, and patient indicated understanding. CSW received permission to fax out referral and follow up with bed offers.        Expected Discharge Plan: Skilled Nursing Facility Barriers to Discharge: Continued Medical Work up, Inadequate or no insurance   Patient Goals and CMS Choice Patient states their goals for this hospitalization and ongoing recovery are:: to get stronger CMS Medicare.gov Compare Post Acute Care list provided to:: Patient Choice offered to / list presented to : Patient  Expected Discharge Plan and Services Expected Discharge Plan: [place] Choice: [place] arrangements for the past 2 months: Single Family Home                                      Prior Living Arrangements/Services Living arrangements for the past 2 months: Single Family Home Lives with:: Self Patient language and need for interpreter reviewed:: No Do you feel safe going back to the place where you live?: Yes      Need for Family Participation in Patient Care: No (Comment) Care giver support system in place?: No (comment) Current home services: DME Criminal Activity/Legal Involvement Pertinent to Current Situation/Hospitalization: No - Comment as needed  Activities of Daily Living Home Assistive Devices/Equipment: Grab bars around toilet, Wheelchair ADL Screening (condition at time of admission) Patient's cognitive ability adequate to safely complete daily activities?: Yes Is the patient deaf or have  difficulty hearing?: No Does the patient have difficulty seeing, even when wearing glasses/contacts?: No Does the patient have difficulty concentrating, remembering, or making decisions?: No Patient able to express need for assistance with ADLs?: Yes Does the patient have difficulty dressing or bathing?: Yes Independently performs ADLs?: No Communication: Independent Dressing (OT): Needs assistance Is this a change from baseline?: Change from baseline, expected to last <3days Grooming: Needs assistance Is this a change from baseline?: Pre-admission baseline Feeding: Independent Bathing: Needs assistance Is this a change from baseline?: Pre-admission baseline Toileting: Needs assistance Is this a change from baseline?: Pre-admission baseline In/Out Bed: Needs assistance Is this a change from baseline?: Pre-admission baseline Walks in Home: Needs assistance Is this a change from baseline?: Pre-admission baseline Does the patient have difficulty walking or climbing stairs?: Yes Weakness of Legs: Left Weakness of Arms/Hands: Left  Permission Sought/Granted Permission sought to share information with : Chartered certified accountant granted to share information with : Yes, Verbal Permission Granted     Permission granted to share info w AGENCY: SNF        Emotional Assessment Appearance:: Appears stated age Attitude/Demeanor/Rapport: Engaged Affect (typically observed): Appropriate Orientation: : Oriented to Self, Oriented to Place, Oriented to  Time, Oriented to Situation Alcohol / Substance Use: Not Applicable Psych Involvement: No (comment)  Admission diagnosis:  Dehydration [E86.0] TIA (transient ischemic attack) [G45.9] Left-sided weakness [R53.1] Fall, initial encounter [W19.XXXA] Acute CVA (cerebrovascular accident) [facility]) [I63.9] Patient Active Problem List   Diagnosis Date Noted   Left-sided weakness 01/31/2022  Depression 01/23/2022   Vitamin D  deficiency 01/23/2022   Hypotension 01/23/2022   Acute ischemic stroke ([place]) 01/01/2022   Acute CVA (cerebrovascular accident) ([place]) 12/28/2021   Upper GI bleeding 08/21/2021   Elevated troponin 08/21/2021   Bilateral leg pain 08/21/2021   Agitation 08/21/2021   Blood in the stool    Normocytic anemia    PCP:  [facility], [facility]:   [place], [place] - [address] [place] [address] Phone: [phone] Fax: [phone]     Social Determinants of Health (SDOH) Interventions    Readmission Risk Interventions    08/22/2021    3:06 PM  Readmission Risk Prevention Plan  Transportation Screening Complete  PCP or Specialist Appt within 5-7 Days Complete  Home Care Screening Complete  Medication Review (RN CM) Complete

## 2022-02-02 NOTE — Progress Notes (Signed)
Hospital day#1 Subjective:   Overnight Events: No acute events overnight.   Patient feeling better, tolerating p.o., in no acute pain, regaining strength, awaiting SNF placement per physical therapy recommendation.  Objective:  Vital signs in last 24 hours: Vitals:   02/02/22 0600 02/02/22 0906 02/02/22 1128 02/02/22 1415  BP:  116/82 128/81 140/88  Pulse:  76 66 67  Resp:  '16 18 17  '$ Temp:  98.6 F (37 C) 98.7 F (37.1 C) 99.2 F (37.3 C)  TempSrc:  Oral Axillary Oral  SpO2:  100% 100% 100%  Weight: 69.4 kg     Height:       Supplemental O2: Room Air SpO2: 100 % Filed Weights   01/31/22 1637 02/02/22 0600  Weight: 73.6 kg 69.4 kg    Physical Exam:  Constitutional:ill-appearing man sitting in chair, eating, in no acute distress Cardiovascular: regular rate and rhythm, no m/r/g Pulmonary/Chest: normal work of breathing on room air, lungs clear to auscultation bilaterally. No wheezing Abdominal: Normal BS, soft, non-tender, non-distended.  MSK: decreased overall bulk and tone.  Neurological: alert & oriented x 3, 5/5 strength in R upper and lower extremities, 4/5 in LUE and LLE. Grossly similar sensation bilaterally. Skin: warm and dry Psych: appropriate mood and depressed affect    Intake/Output Summary (Last 24 hours) at 02/02/2022 1506 Last data filed at 02/02/2022 1425 Gross per 24 hour  Intake 952 ml  Output 560 ml  Net 392 ml   Net IO Since Admission: -508 mL [02/02/22 1506]  Pertinent Labs:    Latest Ref Rng & Units 02/02/2022    8:58 AM 02/01/2022    2:50 PM 02/01/2022    4:55 AM  CBC  WBC 4.0 - 10.5 K/uL 5.7  4.5  4.9   Hemoglobin 13.0 - 17.0 g/dL 13.4  11.0  9.6   Hematocrit 39.0 - 52.0 % 40.7  33.8  29.9   Platelets 150 - 400 K/uL 285  281  256        Latest Ref Rng & Units 02/02/2022    8:58 AM 02/01/2022    4:55 AM 01/31/2022    4:44 PM  CMP  Glucose 70 - 99 mg/dL 81  85  133   BUN 8 - 23 mg/dL '21  25  28   '$ Creatinine 0.61 - 1.24 mg/dL  1.48  1.76  2.30   Sodium 135 - 145 mmol/L 138  139  138   Potassium 3.5 - 5.1 mmol/L 4.2  3.8  4.1   Chloride 98 - 111 mmol/L 104  107  102   CO2 22 - 32 mmol/L 24  28    Calcium 8.9 - 10.3 mg/dL 9.3  8.1      Imaging: No results found.  Assessment/Plan:   Principal Problem:   Left-sided weakness   Patient Summary: Chad Santos is a 61 y.o. 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 Residual dysarthria challenges conversation with him. Weakness has improved since admission. CT without acute abnormality. MRI and MRA without evidence of large vessel occlusion and evidence of expected evolution of subacute infarct involving  the posterior limb of the right internal capsule. Neurology consulted; patient not a tPA  or IR candidate, Carotid Doppler unremarkable. Patient was off of medications for a couple of days.  Patient regaining strength after admission.  Believe that low p.o. intake and dehydration was likely contributing to his overall weakness as well. - Neurology following, appreciate recommendations - Continue on clopidogrel 75 mg - Continue Lipitor 80 mg - Patient on Xarelto 10 mg for VTE prophylaxis - PT evaluated, recommending skilled nursing short-term rehab  AKI,  Admission Cr 2.21.  CR is 1.48, which appears to be around patient's baseline. Patient back on PO diet.  Admission AKI likely in the setting of poor p.o. intake - Monitor BMP  Depressed mood Insomnia Patient with difficulty sleeping at home and depressed mood given socio-economic factors,  which include lack of social support and limited access to care, and limitations with ADLs and iADLs in the setting of post-stroke sequelae.  Doing much better today.  Was able to sleep last night and he is eating much better. - Continue on Xanax 0.5 BID PRN for anxiety - Started on Mirtazapine  Chronic Iron deficiency anemia ?Barrett's esophagus Patient with H. Pylori-associated gastritis, peptic ulcer disease with follow up EGD normal gastric biopsy and intestinal metaplasia in 07/2021, hx of resected tubular adenoma by colonoscopy on 07/2021. Followed in GI clinic with Dr. Thornton Park on 12/2021.  Patient with No evidence of gross blood loss per mouth or rectum. Patient's Hgb on 6/19 12.6. On admission, Hgb 11.2 -> 11.6 ->9.6 -> 11 -> 13.4 today 7/13.  No concern for acute bleeding   HLD - Continue on home Lipitor 80 mg  Diet:  Dysphagia 2 diet VTE:  Xarelto ( rivaroxaban) Code: Full TOC recs: TOC social work discussed SNF placement and patient in agreement.  Patient's Medicaid status could pose potential barriers for placement.   Dispo: Anticipated discharge pending given difficult social barriers, SNF, and clinical improvement  Romana Juniper, MD Internal Medicine Resident PGY-1 Please contact the on call pager after 5 pm and on weekends at [phone].

## 2022-02-03 DIAGNOSIS — R531 Weakness: Secondary | ICD-10-CM | POA: Diagnosis not present

## 2022-02-03 LAB — BASIC METABOLIC PANEL
Anion gap: 12 (ref 5–15)
BUN: 24 mg/dL — ABNORMAL HIGH (ref 8–23)
CO2: 25 mmol/L (ref 22–32)
Calcium: 9.1 mg/dL (ref 8.9–10.3)
Chloride: 103 mmol/L (ref 98–111)
Creatinine, Ser: 1.44 mg/dL — ABNORMAL HIGH (ref 0.61–1.24)
GFR, Estimated: 55 mL/min — ABNORMAL LOW (ref >60)
Glucose, Bld: 95 mg/dL (ref 70–99)
Potassium: 4.3 mmol/L (ref 3.5–5.1)
Sodium: 140 mmol/L (ref 135–145)

## 2022-02-03 MED ORDER — ORAL CARE MOUTH RINSE
15.0000 mL | OROMUCOSAL | Status: DC
  Administered 2022-02-03: 15 mL via OROMUCOSAL

## 2022-02-03 MED ORDER — AMLODIPINE BESYLATE 2.5 MG PO TABS: 2.5000 mg | ORAL_TABLET | Freq: Every day | ORAL | 11 refills | Status: DC

## 2022-02-03 MED ORDER — ORAL CARE MOUTH RINSE: 15.0000 mL | OROMUCOSAL | Status: DC | PRN

## 2022-02-03 NOTE — Discharge Instructions (Addendum)
  Dear Chad Santos,  You were admitted to the hospital because you feel weak and could not move your left side of your body.  You were admitted to evaluate if there have been any new strokes.  We did a CT scan of your head, and MRI, and ultrasound of your neck, and did not find any new findings.  We think that weakness that you had not been experienced was in part because you had not eaten or drunk anything in a very long time and because you are still recovering from it the recent stroke to the right side of your brain.  We think that some of this weakness will remain as a consequence of that stroke because of bed, you have not been able to perform activities of daily living by herself and physical therapy thinks that you will benefit from physical rehabilitation at a skilled nursing facility.  You are now stable, feeling better, and are now being discharged.   It is important that you take all of your medications as prescribed, especially Clopidogrel and Lipitor, as these will decrease the chances of you having another stroke.  The neurologists saw you evaluated at the hospital and would like to see you again in about a month.  The staff at the SNF will make an appointment for you.  While you were at the hospital, you were started on a medication called mirtazapine to help you with the sad mood, insomnia, and lack of appetite.  Feel better, Your Chad Santos internal medicine team

## 2022-02-03 NOTE — Discharge Summary (Addendum)
Name: Chad Santos MRN: [id number] DOB: 1960-10-26 61 y.o. PCP: [facility]  Date of Admission: 01/31/2022  4:18 PM Date of Discharge: 02/03/2022 11:24 AM Attending Physician: Dr. Saverio Danker  Discharge Diagnosis: Principal Problem:   Left-sided weakness    Discharge Medications: Allergies as of 02/03/2022   No Known Allergies      Medication List     TAKE these medications    acetaminophen 325 MG tablet Commonly known as: TYLENOL Take 1-2 tablets (325-650 mg total) by mouth every 4 (four) hours as needed for mild pain.   ALPRAZolam 0.5 MG tablet Commonly known as: XANAX Take 1 tablet (0.5 mg total) by mouth 2 (two) times daily as needed for anxiety (for agitation).   amLODipine 2.5 MG tablet Commonly known as: NORVASC Take 1 tablet (2.5 mg total) by mouth daily.   atorvastatin 80 MG tablet Commonly known as: LIPITOR Take 1 tablet (80 mg total) by mouth at bedtime.   bacitracin ointment Apply 1 Application topically 2 (two) times daily.   clopidogrel 75 MG tablet Commonly known as: PLAVIX Take 1 tablet (75 mg total) by mouth daily.   methocarbamol 500 MG tablet Commonly known as: ROBAXIN Take 1 tablet (500 mg total) by mouth every 6 (six) hours as needed for muscle spasms.   mirtazapine 15 MG tablet Commonly known as: REMERON Take 15 mg by mouth at bedtime.   nicotine 21 mg/24hr patch Commonly known as: NICODERM CQ - dosed in mg/24 hours Place 1 patch (21 mg total) onto the skin daily.   pantoprazole 40 MG tablet Commonly known as: PROTONIX Take 1 tablet (40 mg total) by mouth 2 (two) times daily.   traZODone 150 MG tablet Commonly known as: DESYREL Take 1 tablet (150 mg total) by mouth at bedtime as needed for sleep.   Vitamin D (Ergocalciferol) 1.25 MG (50000 UNIT) Caps capsule Commonly known as: DRISDOL Take 1 capsule (50,000 Units total) by mouth every 7 (seven) days.               Durable Medical Equipment   (From admission, onward)           Start     Ordered   02/03/22 1145  DME Walker  Once       Comments: Rolling walker (2 wheels)  Question Answer Comment  Walker: With 5 Inch Wheels   Patient needs a walker to treat with the following condition Stroke [facility])   Patient needs a walker to treat with the following condition Weakness as late effect of cerebrovascular accident (CVA)      02/03/22 1155            Disposition and follow-up:   [name] was discharged from [facility] in Stable condition.  At the hospital follow up visit please address:  1.  Follow-up:  Internal Capsule infarct LLE weakness and dysphagia -Patient stable at new baseline left-sided weakness -Continue dysphagia diet with chopped foods and thin liquids -Continue on clopidogrel 75 mg -Continue on Lipitor 80 mg -Monitor for signs and symptoms of bleeding  AKI -Baseline Cr ~1.3 -Current Cr 1.48, continued to improve with PO intake -Monitor BMP  Depressed Mood Insomnia Low appetite -No suicidal ideation, increased appetite, and sleep -Monitor for signs and symptoms of worsening -Started on Mirtazapine -Continue on Zanax and Trazadone  Chronic Iron deficiency anemia Gastritis, Hx H Pylori -Stable -Monitor CBC -Continue on Protonix  HTN HLD -Stable.  -Monitor BP and BMP -  Patient was restarted on 2.5 mg dose of amlodipine -Continue Lipitor '80mg'$   Physical therapy recommendations -Frequent or constant Supervision/Assistance -Use of rolling walker   2.  Labs / imaging needed at time of follow-up: cbc, bmp  3.  Pending labs/ test needing follow-up: none  4.  Medication Changes    ADDED  - Mirtazipine for insomnia     RESTARTED  - Amlodipine 2.5 mg  Follow-up Appointments:  Follow-up Information     [facility]. Schedule an appointment as soon as possible for a visit in 1 month(s).   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 intake for 2 days and no acute neurological events per neurological imaging, now back to pre-admission baseline and being discharged to SNF  LLE weakness and dysphagia Recent R internal capsule infarct with residual LLE and LUE weakness and dysphagia Presented with dysarthria and left-sided weakness, thought to be a new CVA.  CT showed no acute abnormalities, MRI and MRA without evidence of large vessel occlusion and evidence of expected evolution of subacute infarct involving the posterior limb of the right internal capsule.  Neurology was consulted.  The patient was not a candidate for tPA or IR.  Carotid Doppler was also unremarkable.  His weakness is likely residual from previous internal capsule infarct, worsened in the setting of low p.o. intake and dehydration.  Patient is now stable and engaging in physical therapy.  We will continue on clopidogrel and Lipitor.  Neurology will follow-up with patient in clinic; instructions to call for an appointment included in the AVS.  AKI Improving. Patient presented with AKI secondary to poor p.o. intake for 2 days.  Cr 1.4.  today. Continue to monitor BMPs.  Patient's baseline is around 1.2-1.3  Depressed Mood Insomnia  Patient has been having difficulty in sleeping at home and increasing signs of depressed mood in the setting of difficult social economic factors.  Patient with poor social support, limited access to care, and inability to complete ADLs and IADLs in  the setting of poststroke sequelae.  Patient previously prescribed mirtazapine but unable to start medication prior to admission.  It has been was started while inpatient and continued on pta Xanax, trazodone.   Chronic Iron deficiency anemia ?Barrett's esophagus Patient was thought to have drop in hemoglobin, but normalized to preadmission baseline since 7/13.  No evidence of gross blood per mouth or rectum.  Monitor CBCs in setting of known chronic iron deficiency anemia. History of possible Barrett's esophagus on endoscopy 11/2021, following with GI.  HTN HLD Stable.  Patient had been normotensive during his hospital stay.  SBP's during the last day have been 140s to 150s.  Patient now back on 2.5 amlodipine mg.  Continue Lipitor 80 mg.  Discharge Subjective: He reports improvement in weakness.  He is reporting less pain. He has not been able to contact family as he does not have a phone. He has been able to eat and drink. He is okay with going to a  SNF for further recovery.   Discharge Exam:   Blood pressure (!) 145/104, pulse 89, temperature 98.2 F (36.8 C), temperature source Oral, resp. rate 17, height '6\' 1"'$  (1.854 m), weight 70.3 kg, SpO2 100 %.  Constitutional:illll-appearing man sitting in chair, in no acute distress HENT: mucous membranes moist Cardiovascular: regular rate and rhythm, no m/r/g, no JVD Pulmonary/Chest: normal work of breathing on room air, lungs clear to auscultation bilaterally. No crackles  Abdominal: soft, non-tender, non-distended.  Neurological: alert & oriented x 3 Dysarthria, 5/5 strength in RUE, RLE. 4/5 in LUE and LLE. Grossly similar sensation bilaterally MSK: no gross abnormalities. No pitting edema Skin: warm and dry Psych: Appropriate mood and affect  Pertinent Labs, Studies, and Procedures:     Latest Ref Rng & Units 02/02/2022    8:58 AM 02/01/2022    2:50 PM 02/01/2022    4:55 AM  CBC  WBC 4.0 - 10.5 K/uL 5.7  4.5  4.9   Hemoglobin 13.0 -  17.0 g/dL 13.4  11.0  9.6   Hematocrit 39.0 - 52.0 % 40.7  33.8  29.9   Platelets 150 - 400 K/uL 285  281  256        Latest Ref Rng & Units 02/03/2022    3:11 AM 02/02/2022    8:58 AM 02/01/2022    4:55 AM  CMP  Glucose 70 - 99 mg/dL 95  81  85   BUN 8 - 23 mg/dL '24  21  25   '$ Creatinine 0.61 - 1.24 mg/dL 1.44  1.48  1.76   Sodium 135 - 145 mmol/L 140  138  139   Potassium 3.5 - 5.1 mmol/L 4.3  4.2  3.8   Chloride 98 - 111 mmol/L 103  104  107   CO2 22 - 32 mmol/L '25  24  28   '$ Calcium 8.9 - 10.3 mg/dL 9.1  9.3  8.1     VAS US CAROTID  Result Date: 02/01/2022 Carotid Arterial Duplex Study Patient Name:  RANIER COACH  Date of Exam:   02/01/2022 Medical Rec #: [id number]         Accession #:    [id number] Date of Birth: 14-May-1961          Patient Gender: M Patient Age:   6 years Exam Location:  [facility] Procedure:      VAS US CAROTID Referring Phys: Cornelius Moras XU --------------------------------------------------------------------------------  Indications:       CVA. Risk Factors:      Current smoker. Comparison Study:  No previous exam noted. Performing Technologist: Bobetta Lime BS, RVT  Examination Guidelines: A complete evaluation includes B-mode imaging, spectral Doppler, color Doppler, and power Doppler as needed of all accessible portions of each vessel. Bilateral testing is considered an integral part of a complete examination. Limited examinations for reoccurring indications may be performed as noted.  Right Carotid Findings: +----------+--------+--------+--------+------------------+------------------+           PSV cm/sEDV cm/sStenosisPlaque DescriptionComments           +----------+--------+--------+--------+------------------+------------------+ CCA Prox  59      21                                intimal thickening +----------+--------+--------+--------+------------------+------------------+ CCA Distal59      21  intimal  thickening +----------+--------+--------+--------+------------------+------------------+ ICA Prox  52      16              heterogenous                         +----------+--------+--------+--------+------------------+------------------+ ICA Distal45      21                                                   +----------+--------+--------+--------+------------------+------------------+ ECA       115     18              calcific                             +----------+--------+--------+--------+------------------+------------------+ +----------+--------+-------+----------------+-------------------+           PSV cm/sEDV cmsDescribe        Arm Pressure (mmHG) +----------+--------+-------+----------------+-------------------+ [id number]            Multiphasic, WNL                    +----------+--------+-------+----------------+-------------------+ +---------+--------+--+--------+-+---------+ VertebralPSV cm/s31EDV cm/s8Antegrade +---------+--------+--+--------+-+---------+  Left Carotid Findings: +----------+--------+--------+--------+------------------+------------------+           PSV cm/sEDV cm/sStenosisPlaque DescriptionComments           +----------+--------+--------+--------+------------------+------------------+ CCA Prox  92      22                                                   +----------+--------+--------+--------+------------------+------------------+ CCA Distal60      24                                intimal thickening +----------+--------+--------+--------+------------------+------------------+ ICA Prox  64      20              heterogenous                         +----------+--------+--------+--------+------------------+------------------+ ICA Distal56      21                                                   +----------+--------+--------+--------+------------------+------------------+ ECA       63      14               heterogenous                         +----------+--------+--------+--------+------------------+------------------+ +----------+--------+--------+----------------+-------------------+           PSV cm/sEDV cm/sDescribe        Arm Pressure (mmHG) +----------+--------+--------+----------------+-------------------+ [id number]             Multiphasic, WNL                    +----------+--------+--------+----------------+-------------------+ +---------+--------+--+--------+--+---------+ VertebralPSV cm/s41EDV cm/s12Antegrade +---------+--------+--+--------+--+---------+   Summary: Right Carotid: Velocities in the  right ICA are consistent with a 1-39% stenosis. Left Carotid: Velocities in the left ICA are consistent with a 1-39% stenosis. Vertebrals:  Bilateral vertebral arteries demonstrate antegrade flow. Subclavians: Normal flow hemodynamics were seen in bilateral subclavian              arteries. *See table(s) above for measurements and observations.  Electronically signed by Harold Barban MD on 02/01/2022 at 9:55:34 PM.    Final    US RENAL  Result Date: 01/31/2022 CLINICAL DATA:  Acute kidney injury EXAM: RENAL / URINARY TRACT ULTRASOUND COMPLETE COMPARISON:  None Available. FINDINGS: Right Kidney: Renal measurements: 11.4 x 5.2 x 5.2 cm = volume: 163 mL. Echogenicity within normal limits. No mass or hydronephrosis visualized. Left Kidney: Renal measurements: 10.6 x 5.2 x 5.0 cm = volume: 144 mL. Echogenicity within normal limits. There are 2 simple cysts in the left kidney. Superior pole cyst measures 2.4 x 1.9 x 2.0 cm. Inferior pole cyst measures 2.8 x 2.4 x 2.0 cm. Bladder: Appears normal for degree of bladder distention.  Under distended. Other: None. IMPRESSION: 1. No acute abnormality. 2. Left renal simple cysts. Electronically Signed   By: Ronney Asters M.D.   On: 01/31/2022 23:24   MR BRAIN WO CONTRAST  Result Date: 01/31/2022 CLINICAL DATA:  Neuro deficit, acute, stroke  suspected. Left-sided weakness and facial droop. EXAM: MRI HEAD WITHOUT CONTRAST TECHNIQUE: Multiplanar, multiecho pulse sequences of the brain and surrounding structures were obtained without intravenous contrast. COMPARISON:  MR head with out contrast 12/28/2021 FINDINGS: Brain: Residual T2 shine through is present in the subacute infarct along the posterior limb of the right internal capsule no acute infarct, hemorrhage, or mass lesion is present. Study is moderately degraded by patient motion. Asymmetric left periventricular T2 hyperintensities again noted with some volume loss. Moderate generalized atrophy is present. White matter disease is stable. The internal auditory canals are within normal limits. Remote lacunar infarcts are present the posterior cerebellum bilaterally, stable brainstem and cerebellum are otherwise within normal limits. Vascular: Flow is present in the major intracranial arteries. Skull and upper cervical spine: The craniocervical junction is normal. Upper cervical spine is within normal limits. Marrow signal is unremarkable. Sinuses/Orbits: The paranasal sinuses and mastoid air cells are clear. The globes and orbits are within normal limits. IMPRESSION: 1. No acute intracranial abnormality or significant interval change. 2. Expected evolution of subacute infarct involving the posterior limb of the right internal capsule. 3. Stable atrophy and white matter disease. This likely reflects the sequela of chronic microvascular ischemia. Electronically Signed   By: San Morelle M.D.   On: 01/31/2022 18:58   MR ANGIO HEAD WO CONTRAST  Result Date: 01/31/2022 CLINICAL DATA:  Neuro deficit, acute, stroke suspected. Left-sided weakness and facial droop. EXAM: MRA HEAD WITHOUT CONTRAST TECHNIQUE: Angiographic images of the Circle of Willis were acquired using MRA technique without intravenous contrast. COMPARISON:  MRA circle-of-Willis 12/28/2021 FINDINGS: Anterior circulation: Internal  carotid arteries are within normal limits from the high cervical segments through the ICA termini bilaterally. The A1 and M1 segments are normal. No definite anterior communicating artery is present. MCA bifurcations are intact. The ACA and MCA branch vessels are normal. Posterior circulation: The rights vertebral artery is dominant. PICA origins are below the field of view. Vertebrobasilar junction and basilar artery are normal. The right posterior cerebral artery is of scratched at the right posterior cerebral artery originates from the basilar tip with a prominent right posterior communicating artery. The left posterior cerebral artery  is of fetal type with a small left P1 segment contributing. PCA branch vessels are within normal limits. Anatomic variants: Fetal type left posterior cerebral artery. Other: None. IMPRESSION: 1. Normal MRA circle-of-Willis without evidence for significant proximal stenosis, aneurysm, or branch vessel occlusion. 2. Fetal type left posterior cerebral artery.  The Electronically Signed   By: San Morelle M.D.   On: 01/31/2022 18:55   CT HEAD CODE STROKE WO CONTRAST  Result Date: 01/31/2022 CLINICAL DATA:  Code stroke.  Neuro deficit, acute, stroke suspected EXAM: CT HEAD WITHOUT CONTRAST TECHNIQUE: Contiguous axial images were obtained from the base of the skull through the vertex without intravenous contrast. RADIATION DOSE REDUCTION: This exam was performed according to the departmental dose-optimization program which includes automated exposure control, adjustment of the mA and/or kV according to patient size and/or use of iterative reconstruction technique. COMPARISON:  CT head and MRI head December 28 2021. FINDINGS: Brain: Prior left cerebellar and bilateral basal ganglia infarcts. Additional patchy white matter hypoattenuation, nonspecific but compatible with chronic microvascular ischemic disease. No evidence of acute large vascular territory infarct, acute hemorrhage,  mass lesion, midline shift low or acute hemorrhage. Vascular: No hyperdense vessel identified. Calcific intracranial atherosclerosis. Skull: No acute fracture. Sinuses/Orbits: Clear visualized sinuses. No acute orbital findings. Other: No mastoid effusions. ASPECTS [facility] Stroke Program Early CT Score) total score (0-10 with 10 being normal): 10. IMPRESSION: 1. No evidence of acute intracranial abnormality. ASPECTS is 10. 2. Remote infarcts and chronic microvascular ischemic disease. Code stroke imaging results were communicated on 01/31/2022 at 4:53 pm to provider Dr. Erlinda Hong via secure text paging. Electronically Signed   By: Margaretha Sheffield M.D.   On: 01/31/2022 16:53     Discharge Instructions: Discharge Instructions     Ambulatory referral to Neurology   Complete by: As directed    Follow up with stroke clinic NP (Jessica Vanschaick or Cecille Rubin, if both not available, consider Zachery Dauer, or [place]) at [facility] in about 4 weeks. Thanks.       Signed: Romana Juniper, MD [name] - PGY1 Pager: [phone] 02/03/2022, 11:24 AM

## 2022-02-03 NOTE — TOC Transition Note (Signed)
Transition of Care [facility]) - CM/SW Discharge Note   Patient Details  Name: Chad Santos MRN: [id number] Date of Birth: March 17, 1961  [facility] CM/SW Contact:  Geralynn Ochs, LCSW Phone Number: 02/03/2022, 12:32 PM   Clinical Narrative:   CSW confirmed with MD that patient is medically ready for discharge to SNF. CSW met with patient to discuss bed offer at [facility], patient is agreeable. Patient did not want CSW to update any family. CSW confirmed bed availability at [facility] today. CSW sent discharge information, scheduled transport with [facility] for next available.  Nurse to call report to [phone].    Final next level of care: Skilled Nursing Facility Barriers to Discharge: Barriers Resolved   Patient Goals and CMS Choice Patient states their goals for this hospitalization and ongoing recovery are:: to get stronger CMS Medicare.gov Compare Post Acute Care list provided to:: Patient Choice offered to / list presented to : Patient  Discharge Placement              Patient chooses bed at:  [facility]) Patient to be transferred to facility by: [place] Name of family member notified: Self Patient and family notified of of transfer: 02/03/22  Discharge Plan and Services     Post Acute Care Choice: [place]                               Social Determinants of Health (SDOH) Interventions     Readmission Risk Interventions    08/22/2021    3:06 PM  Readmission Risk Prevention Plan  Transportation Screening Complete  PCP or Specialist Appt within 5-7 Days Complete  Home Care Screening Complete  Medication Review (RN CM) Complete

## 2022-03-09 ENCOUNTER — Telehealth: Payer: Self-pay | Admitting: *Deleted

## 2022-03-09 ENCOUNTER — Encounter: Payer: Self-pay | Admitting: Student

## 2022-03-09 NOTE — Telephone Encounter (Signed)
Spoke with sister Derrell Lolling [phone]). Ms. Vito Backers states Mr. Mathis will be attending            [facility] starting September 1.2023 for 8 hour per day.  Shredding the PCS form that was completed on this patient.

## 2022-03-09 NOTE — Telephone Encounter (Signed)
Called patient unable to leave message not answer/ called sister. Working on [employer] forms. Needing to know who do he wants on form as contact person.

## 2022-03-25 ENCOUNTER — Encounter (HOSPITAL_COMMUNITY): Payer: Self-pay | Admitting: Emergency Medicine

## 2022-03-25 ENCOUNTER — Ambulatory Visit (HOSPITAL_COMMUNITY)
Admission: EM | Admit: 2022-03-25 | Discharge: 2022-03-25 | Disposition: A | Discharge status: Home / Self Care | Payer: Medicaid Other | Attending: Physician Assistant | Admitting: Physician Assistant

## 2022-03-25 DIAGNOSIS — F5101 Primary insomnia: Secondary | ICD-10-CM | POA: Diagnosis not present

## 2022-03-25 DIAGNOSIS — Z760 Encounter for issue of repeat prescription: Secondary | ICD-10-CM

## 2022-03-25 MED ORDER — TRAZODONE HCL 150 MG PO TABS: 150.0000 mg | ORAL_TABLET | Freq: Every evening | ORAL | 1 refills | Status: DC | PRN

## 2022-03-25 NOTE — ED Provider Notes (Signed)
Chad Santos    CSN: [id number] Arrival date & time: 03/25/22  1122      History   Chief Complaint Chief Complaint  Patient presents with   Insomnia   Medication Refill    HPI Chad Santos is a 61 y.o. male.   61 year old male presents with insomnia.  Patient indicates that he is here today for medication refill.  He has been taking trazodone 150 mg at bedtime to help improve his sleep pattern.  Patient indicates that this tends to work well for him without any side effects from the medication.  He indicates that he is able to sleep 6 to 8 hours on a regular basis when he takes the medicine at bedtime.  He indicates that when he did run out of the medicine he was only able to sleep 1 hour before waking up and being restless and awake for the rest of the night.  He indicates he does not have a PCP however he did sign up with [facility] and he is due to become established with a PCP around the 14th to 15 September, 2023.  He has no other problems or complaints today.   Insomnia  Medication Refill   Past Medical History:  Diagnosis Date   Hip osteoarthritis 07/10/2020   Hypertension     Patient Active Problem List   Diagnosis Date Noted   Left-sided weakness 01/31/2022   Depression 01/23/2022   Vitamin D deficiency 01/23/2022   Hypotension 01/23/2022   Acute ischemic stroke ([place]) 01/01/2022   Acute CVA (cerebrovascular accident) ([place]) 12/28/2021   Upper GI bleeding 08/21/2021   Elevated troponin 08/21/2021   Bilateral leg pain 08/21/2021   Agitation 08/21/2021   Blood in the stool    Normocytic anemia     Past Surgical History:  Procedure Laterality Date   BIOPSY  08/23/2021   Procedure: BIOPSY;  Surgeon: [name], MD;  Location: [place];  Service: Gastroenterology;;   COLONOSCOPY WITH PROPOFOL N/A 08/23/2021   Procedure: COLONOSCOPY WITH PROPOFOL;  Surgeon: [name], MD;  Location: [place];  Service: Gastroenterology;  Laterality: N/A;    ESOPHAGOGASTRODUODENOSCOPY (EGD) WITH PROPOFOL N/A 08/23/2021   Procedure: ESOPHAGOGASTRODUODENOSCOPY (EGD) WITH PROPOFOL;  Surgeon: [name], MD;  Location: [place];  Service: Gastroenterology;  Laterality: N/A;   HAND SURGERY Right    POLYPECTOMY  08/23/2021   Procedure: POLYPECTOMY;  Surgeon: [name], MD;  Location: [facility] ENDOSCOPY;  Service: Gastroenterology;;       Home Medications    Prior to Admission medications   Medication Sig Start Date End Date Taking? Authorizing Provider  acetaminophen (TYLENOL) 325 MG tablet Take 1-2 tablets (325-650 mg total) by mouth every 4 (four) hours as needed for mild pain. 01/09/22   Setzer, Edman Circle, PA-C  ALPRAZolam Duanne Moron) 0.5 MG tablet Take 1 tablet (0.5 mg total) by mouth 2 (two) times daily as needed for anxiety (for agitation). 01/09/22   Setzer, Edman Circle, PA-C  amLODipine (NORVASC) 2.5 MG tablet Take 1 tablet (2.5 mg total) by mouth daily. 02/03/22 02/03/23  Romana Juniper, MD  atorvastatin (LIPITOR) 80 MG tablet Take 1 tablet (80 mg total) by mouth at bedtime. 01/09/22   Setzer, Edman Circle, PA-C  bacitracin ointment Apply 1 Application topically 2 (two) times daily. 01/09/22   Setzer, Edman Circle, PA-C  clopidogrel (PLAVIX) 75 MG tablet Take 1 tablet (75 mg total) by mouth daily. 01/09/22   Setzer, Edman Circle, PA-C  methocarbamol (ROBAXIN) 500 MG  tablet Take 1 tablet (500 mg total) by mouth every 6 (six) hours as needed for muscle spasms. 01/09/22   Setzer, Edman Circle, PA-C  mirtazapine (REMERON) 15 MG tablet Take 15 mg by mouth at bedtime.    [provider]  nicotine (NICODERM CQ - DOSED IN MG/24 HOURS) 21 mg/24hr patch Place 1 patch (21 mg total) onto the skin daily. 01/10/22   Setzer, Edman Circle, PA-C  pantoprazole (PROTONIX) 40 MG tablet Take 1 tablet (40 mg total) by mouth 2 (two) times daily. 01/09/22   Setzer, Edman Circle, PA-C  traZODone (DESYREL) 150 MG tablet Take 1 tablet (150 mg total) by mouth at bedtime as needed for  sleep. 03/25/22   Nyoka Lint, PA-C  Vitamin D, Ergocalciferol, (DRISDOL) 1.25 MG (50000 UNIT) CAPS capsule Take 1 capsule (50,000 Units total) by mouth every 7 (seven) days. 01/26/22   Riesa Pope, MD    Family History Family History  Problem Relation Age of Onset   Colon cancer Neg Hx    Esophageal cancer Neg Hx    Rectal cancer Neg Hx    Stomach cancer Neg Hx     Social History Social History   Tobacco Use   Smoking status: Every Day    Packs/day: 1.00    Years: 37.00    Total pack years: 37.00    Types: Cigarettes   Smokeless tobacco: Never   Tobacco comments:    Reports <1ppd  Vaping Use   Vaping Use: Never used  Substance Use Topics   Alcohol use: Not Currently    Comment: occasionally,6 pack/month   Drug use: Never     Allergies   Patient has no known allergies.   Review of Systems Review of Systems  Psychiatric/Behavioral:  Positive for sleep disturbance (insomnia). The patient has insomnia.      Physical Exam Triage Vital Signs ED Triage Vitals  Enc Vitals Group     BP 03/25/22 1147 123/80     Pulse Rate 03/25/22 1147 93     Resp 03/25/22 1147 18     Temp 03/25/22 1147 98.6 F (37 C)     Temp Source 03/25/22 1147 Oral     SpO2 03/25/22 1147 96 %     Weight --      Height --      Head Circumference --      Peak Flow --      Pain Score 03/25/22 1145 0     Pain Loc --      Pain Edu? --      Excl. in [place]? --    No data found.  Updated Vital Signs BP 123/80 (BP Location: Left Arm)   Pulse 93   Temp 98.6 F (37 C) (Oral)   Resp 18   SpO2 96%   Visual Acuity Right Eye Distance:   Left Eye Distance:   Bilateral Distance:    Right Eye Near:   Left Eye Near:    Bilateral Near:     Physical Exam Constitutional:      Appearance: Normal appearance.  Cardiovascular:     Rate and Rhythm: Normal rate and regular rhythm.     Heart sounds: Normal heart sounds.  Pulmonary:     Effort: Pulmonary effort is normal.     Breath sounds:  Normal breath sounds and air entry. No wheezing, rhonchi or rales.  Neurological:     General: No focal deficit present.     Mental Status: He is alert.  Motor: Motor function is intact.      UC Treatments / Results  Labs (all labs ordered are listed, but only abnormal results are displayed) Labs Reviewed - No data to display  EKG   Radiology No results found.  Procedures Procedures (including critical care time)  Medications Ordered in UC Medications - No data to display  Initial Impression / Assessment and Plan / UC Course  I have reviewed the triage vital signs and the nursing notes.  Pertinent labs & imaging results that were available during my care of the patient were reviewed by me and considered in my medical decision making (see chart for details).    Plan: 1.  Advised to continue taking the trazodone 150 mg at bedtime to help improve sleep pattern. 2.  Advised to become established with a PCP through [facility]. 3.  Advised to return to urgent care as needed. Final Clinical Impressions(s) / UC Diagnoses   Final diagnoses:  Medication refill  Primary insomnia     Discharge Instructions      Advised to continue taking the trazodone 150 mg 1 at bedtime to help increase sleep pattern. Advised to keep the appointment with [facility] to establish care with a primary care physician. Advised to return to urgent care as needed.    ED Prescriptions     Medication Sig Dispense Auth. Provider   traZODone (DESYREL) 150 MG tablet Take 1 tablet (150 mg total) by mouth at bedtime as needed for sleep. 30 tablet Nyoka Lint, PA-C      I have reviewed the PDMP during this encounter.   Nyoka Lint, PA-C 03/25/22 1212

## 2022-03-25 NOTE — ED Triage Notes (Signed)
Pt reports that ran out of sleeping medication and needing refill bc last night slept for an hour then woke up. Denies having a PCP. Pt takes Trazadone 150 mg.

## 2022-03-25 NOTE — Discharge Instructions (Signed)
Advised to continue taking the trazodone 150 mg 1 at bedtime to help increase sleep pattern. Advised to keep the appointment with [facility] to establish care with a primary care physician. Advised to return to urgent care as needed.

## 2022-04-06 ENCOUNTER — Other Ambulatory Visit: Payer: Self-pay | Admitting: Internal Medicine

## 2022-04-10 ENCOUNTER — Other Ambulatory Visit: Payer: Self-pay | Admitting: Family Medicine

## 2022-04-20 ENCOUNTER — Other Ambulatory Visit: Payer: Self-pay | Admitting: Internal Medicine

## 2022-04-20 DIAGNOSIS — Z0001 Encounter for general adult medical examination with abnormal findings: Secondary | ICD-10-CM

## 2022-05-08 ENCOUNTER — Encounter (HOSPITAL_COMMUNITY): Payer: Self-pay

## 2022-05-08 ENCOUNTER — Ambulatory Visit (HOSPITAL_COMMUNITY)
Admission: EM | Admit: 2022-05-08 | Discharge: 2022-05-08 | Disposition: A | Discharge status: Home / Self Care | Payer: Medicare (Managed Care) | Attending: Family Medicine | Admitting: Family Medicine

## 2022-05-08 DIAGNOSIS — G4700 Insomnia, unspecified: Secondary | ICD-10-CM

## 2022-05-08 DIAGNOSIS — Z760 Encounter for issue of repeat prescription: Secondary | ICD-10-CM

## 2022-05-08 MED ORDER — TRAZODONE HCL 150 MG PO TABS: 150.0000 mg | ORAL_TABLET | Freq: Every evening | ORAL | 1 refills | Status: AC | PRN

## 2022-05-08 NOTE — ED Provider Notes (Signed)
Delia    CSN: [id number] Arrival date & time: 05/08/22  1318      History   Chief Complaint Chief Complaint  Patient presents with   Insomnia   Medication Refill    HPI Chad Santos is a 61 y.o. male.  Patient presenting for medication refill of trazodone.  Patient states uses this medication for insomnia, without the medication he is unable to get adequate sleep at night.  He states he has used this medication for the past 6 months approximately.  Patient was seen here on 03/25/2022 and given a 30-day supply of the medication.  The PCP who first prescribed the medication is no longer taking care of this patient per the patient statement. Patient states he has been unable to get a PCP which has caused problems with him being able to get his trazodone medication refill.    Insomnia  Medication Refill   Past Medical History:  Diagnosis Date   Hip osteoarthritis 07/10/2020   Hypertension     Patient Active Problem List   Diagnosis Date Noted   Left-sided weakness 01/31/2022   Depression 01/23/2022   Vitamin D deficiency 01/23/2022   Hypotension 01/23/2022   Acute ischemic stroke ([place]) 01/01/2022   Acute CVA (cerebrovascular accident) ([place]) 12/28/2021   Upper GI bleeding 08/21/2021   Elevated troponin 08/21/2021   Bilateral leg pain 08/21/2021   Agitation 08/21/2021   Blood in the stool    Normocytic anemia     Past Surgical History:  Procedure Laterality Date   BIOPSY  08/23/2021   Procedure: BIOPSY;  Surgeon: [name], MD;  Location: [place];  Service: Gastroenterology;;   COLONOSCOPY WITH PROPOFOL N/A 08/23/2021   Procedure: COLONOSCOPY WITH PROPOFOL;  Surgeon: [name], MD;  Location: [place];  Service: Gastroenterology;  Laterality: N/A;   ESOPHAGOGASTRODUODENOSCOPY (EGD) WITH PROPOFOL N/A 08/23/2021   Procedure: ESOPHAGOGASTRODUODENOSCOPY (EGD) WITH PROPOFOL;  Surgeon: [name], MD;  Location: [place];   Service: Gastroenterology;  Laterality: N/A;   HAND SURGERY Right    POLYPECTOMY  08/23/2021   Procedure: POLYPECTOMY;  Surgeon: [name], MD;  Location[facility] ENDOSCOPY;  Service: Gastroenterology;;       Home Medications    Prior to Admission medications   Medication Sig Start Date End Date Taking? Authorizing Provider  acetaminophen (TYLENOL) 325 MG tablet Take 1-2 tablets (325-650 mg total) by mouth every 4 (four) hours as needed for mild pain. 01/09/22   Setzer, Edman Circle, PA-C  ALPRAZolam Duanne Moron) 0.5 MG tablet Take 1 tablet (0.5 mg total) by mouth 2 (two) times daily as needed for anxiety (for agitation). 01/09/22   Setzer, Edman Circle, PA-C  amLODipine (NORVASC) 2.5 MG tablet Take 1 tablet (2.5 mg total) by mouth daily. 02/03/22 02/03/23  Romana Juniper, MD  atorvastatin (LIPITOR) 80 MG tablet Take 1 tablet (80 mg total) by mouth at bedtime. 01/09/22   Setzer, Edman Circle, PA-C  bacitracin ointment Apply 1 Application topically 2 (two) times daily. 01/09/22   Setzer, Edman Circle, PA-C  clopidogrel (PLAVIX) 75 MG tablet Take 1 tablet (75 mg total) by mouth daily. 01/09/22   Setzer, Edman Circle, PA-C  methocarbamol (ROBAXIN) 500 MG tablet Take 1 tablet (500 mg total) by mouth every 6 (six) hours as needed for muscle spasms. 01/09/22   Setzer, Edman Circle, PA-C  mirtazapine (REMERON) 15 MG tablet Take 15 mg by mouth at bedtime.    [provider]  pantoprazole (PROTONIX) 40 MG tablet  Take 1 tablet (40 mg total) by mouth 2 (two) times daily. 01/09/22   Setzer, Edman Circle, PA-C  traZODone (DESYREL) 150 MG tablet Take 1 tablet (150 mg total) by mouth at bedtime as needed for sleep. 05/08/22   Flossie Dibble, NP  Vitamin D, Ergocalciferol, (DRISDOL) 1.25 MG (50000 UNIT) CAPS capsule Take 1 capsule (50,000 Units total) by mouth every 7 (seven) days. 01/26/22   Riesa Pope, MD    Family History Family History  Problem Relation Age of Onset   Colon cancer Neg Hx    Esophageal cancer  Neg Hx    Rectal cancer Neg Hx    Stomach cancer Neg Hx     Social History Social History   Tobacco Use   Smoking status: Every Day    Packs/day: 1.00    Years: 37.00    Total pack years: 37.00    Types: Cigarettes   Smokeless tobacco: Never   Tobacco comments:    Reports <1ppd  Vaping Use   Vaping Use: Never used  Substance Use Topics   Alcohol use: Not Currently    Comment: occasionally,6 pack/month   Drug use: Never     Allergies   Patient has no known allergies.   Review of Systems Review of Systems  Psychiatric/Behavioral:  Positive for sleep disturbance. Negative for agitation and confusion. The patient has insomnia.      Physical Exam Triage Vital Signs ED Triage Vitals  Enc Vitals Group     BP 05/08/22 1532 (!) 148/83     Pulse Rate 05/08/22 1532 73     Resp 05/08/22 1532 18     Temp 05/08/22 1532 97.7 F (36.5 C)     Temp Source 05/08/22 1532 Oral     SpO2 05/08/22 1532 99 %     Weight --      Height --      Head Circumference --      Peak Flow --      Pain Score 05/08/22 1533 0     Pain Loc --      Pain Edu? --      Excl. in [place]? --    No data found.  Updated Vital Signs BP (!) 148/83 (BP Location: Left Arm)   Pulse 73   Temp 97.7 F (36.5 C) (Oral)   Resp 18   SpO2 99%   Physical Exam Vitals and nursing note reviewed.  Constitutional:      Appearance: Normal appearance.  Cardiovascular:     Rate and Rhythm: Normal rate and regular rhythm.  Pulmonary:     Effort: Pulmonary effort is normal.     Breath sounds: Normal breath sounds and air entry. No decreased breath sounds, wheezing, rhonchi or rales.  Neurological:     Mental Status: He is alert.  Psychiatric:        Mood and Affect: Mood normal.        Behavior: Behavior normal.        Thought Content: Thought content normal.        Judgment: Judgment normal.      UC Treatments / Results  Labs (all labs ordered are listed, but only abnormal results are displayed) Labs  Reviewed - No data to display  EKG   Radiology No results found.  Procedures Procedures (including critical care time)  Medications Ordered in UC Medications - No data to display  Initial Impression / Assessment and Plan / UC Course  I have reviewed the  triage vital signs and the nursing notes.  Pertinent labs & imaging results that were available during my care of the patient were reviewed by me and considered in my medical decision making (see chart for details).     Patient was evaluated for medication refill today.  Trazodone 150 mg, 30-day supply sent to pharmacy.  Patient was counseled on needing a PCP to refill this medication.  Patient has taken this medication for approximately the past 6 months.  A notification was put in the system for establishing care with a PCP.  Patient verbalized understanding of instructions.  Final Clinical Impressions(s) / UC Diagnoses   Final diagnoses:  Medication refill  Insomnia, unspecified type     Discharge Instructions      I have sent in a 30-day supply of the trazodone.  A notification has been put in the system for a staff member to contact you for establishing care with a PCP.   As discussed, it's important for you to establish care with a PCP.      ED Prescriptions     Medication Sig Dispense Auth. Provider   traZODone (DESYREL) 150 MG tablet Take 1 tablet (150 mg total) by mouth at bedtime as needed for sleep. 30 tablet Flossie Dibble, NP      PDMP not reviewed this encounter.   Flossie Dibble, NP 05/08/22 [phone]

## 2022-05-08 NOTE — Discharge Instructions (Addendum)
I have sent in a 30-day supply of the trazodone.  A notification has been put in the system for a staff member to contact you for establishing care with a PCP.   As discussed, it's important for you to establish care with a PCP.

## 2022-05-08 NOTE — ED Triage Notes (Signed)
Pt states unable to sleep last night d/t out of his trazadone. States did not f/u with PCP last month.

## 2022-05-15 ENCOUNTER — Inpatient Hospital Stay: Admission: RE | Admit: 2022-05-15 | Payer: Medicare (Managed Care) | Source: Ambulatory Visit

## 2022-06-20 ENCOUNTER — Emergency Department (HOSPITAL_COMMUNITY): Payer: Medicare (Managed Care)

## 2022-06-20 ENCOUNTER — Inpatient Hospital Stay: Admission: RE | Admit: 2022-06-20 | Payer: Medicare (Managed Care) | Source: Ambulatory Visit

## 2022-06-20 ENCOUNTER — Other Ambulatory Visit: Payer: Self-pay

## 2022-06-20 ENCOUNTER — Emergency Department (HOSPITAL_COMMUNITY)
Admission: EM | Admit: 2022-06-20 | Discharge: 2022-06-20 | Disposition: A | Discharge status: Home / Self Care | Payer: Medicare (Managed Care) | Attending: Emergency Medicine | Admitting: Emergency Medicine

## 2022-06-20 DIAGNOSIS — S0990XA Unspecified injury of head, initial encounter: Secondary | ICD-10-CM | POA: Diagnosis not present

## 2022-06-20 DIAGNOSIS — S80211A Abrasion, right knee, initial encounter: Secondary | ICD-10-CM | POA: Insufficient documentation

## 2022-06-20 DIAGNOSIS — Z79899 Other long term (current) drug therapy: Secondary | ICD-10-CM | POA: Insufficient documentation

## 2022-06-20 DIAGNOSIS — S8991XA Unspecified injury of right lower leg, initial encounter: Secondary | ICD-10-CM | POA: Diagnosis present

## 2022-06-20 DIAGNOSIS — T148XXA Other injury of unspecified body region, initial encounter: Secondary | ICD-10-CM

## 2022-06-20 LAB — CBC WITH DIFFERENTIAL/PLATELET
Abs Immature Granulocytes: 0.03 10*3/uL (ref 0.00–0.07)
Basophils Absolute: 0 10*3/uL (ref 0.0–0.1)
Basophils Relative: 0 %
Eosinophils Absolute: 0 10*3/uL (ref 0.0–0.5)
Eosinophils Relative: 1 %
HCT: 37.5 % — ABNORMAL LOW (ref 39.0–52.0)
Hemoglobin: 12.4 g/dL — ABNORMAL LOW (ref 13.0–17.0)
Immature Granulocytes: 0 %
Lymphocytes Relative: 29 %
Lymphs Abs: 2.4 10*3/uL (ref 0.7–4.0)
MCH: 30 pg (ref 26.0–34.0)
MCHC: 33.1 g/dL (ref 30.0–36.0)
MCV: 90.8 fL (ref 80.0–100.0)
Monocytes Absolute: 0.7 10*3/uL (ref 0.1–1.0)
Monocytes Relative: 9 %
Neutro Abs: 5 10*3/uL (ref 1.7–7.7)
Neutrophils Relative %: 61 %
Platelets: 246 10*3/uL (ref 150–400)
RBC: 4.13 MIL/uL — ABNORMAL LOW (ref 4.22–5.81)
RDW: 13.5 % (ref 11.5–15.5)
WBC: 8.3 10*3/uL (ref 4.0–10.5)
nRBC: 0 % (ref 0.0–0.2)

## 2022-06-20 LAB — BASIC METABOLIC PANEL
Anion gap: 17 — ABNORMAL HIGH (ref 5–15)
BUN: 36 mg/dL — ABNORMAL HIGH (ref 8–23)
CO2: 22 mmol/L (ref 22–32)
Calcium: 10.3 mg/dL (ref 8.9–10.3)
Chloride: 101 mmol/L (ref 98–111)
Creatinine, Ser: 1.52 mg/dL — ABNORMAL HIGH (ref 0.61–1.24)
GFR, Estimated: 51 mL/min — ABNORMAL LOW (ref >60)
Glucose, Bld: 103 mg/dL — ABNORMAL HIGH (ref 70–99)
Potassium: 4.3 mmol/L (ref 3.5–5.1)
Sodium: 140 mmol/L (ref 135–145)

## 2022-06-20 LAB — I-STAT CHEM 8, ED
BUN: 36 mg/dL — ABNORMAL HIGH (ref 8–23)
Calcium, Ion: 1.16 mmol/L (ref 1.15–1.40)
Chloride: 105 mmol/L (ref 98–111)
Creatinine, Ser: 1.5 mg/dL — ABNORMAL HIGH (ref 0.61–1.24)
Glucose, Bld: 99 mg/dL (ref 70–99)
HCT: 39 % (ref 39.0–52.0)
Hemoglobin: 13.3 g/dL (ref 13.0–17.0)
Potassium: 4.1 mmol/L (ref 3.5–5.1)
Sodium: 137 mmol/L (ref 135–145)
TCO2: 22 mmol/L (ref 22–32)

## 2022-06-20 LAB — TYPE AND SCREEN
ABO/RH(D): O POS
Antibody Screen: NEGATIVE

## 2022-06-20 LAB — ETHANOL: Alcohol, Ethyl (B): <10 mg/dL (ref <10)

## 2022-06-20 NOTE — Progress Notes (Signed)
Chaplain responded to Level 2.  Staff working on patient.  Patient has contacted brother to let him know he's here.  Contact chaplain if needed. Rev. Tamsen Snider Pager [phone]
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
## 2022-06-20 NOTE — Discharge Instructions (Addendum)
Return for any problem.  ?
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
## 2022-07-27 ENCOUNTER — Other Ambulatory Visit (HOSPITAL_COMMUNITY): Payer: Self-pay | Admitting: Urology

## 2022-07-27 DIAGNOSIS — C61 Malignant neoplasm of prostate: Secondary | ICD-10-CM

## 2022-08-11 ENCOUNTER — Other Ambulatory Visit (HOSPITAL_COMMUNITY): Payer: Medicare (Managed Care)

## 2022-08-16 ENCOUNTER — Encounter (HOSPITAL_COMMUNITY)
Admission: RE | Admit: 2022-08-16 | Discharge: 2022-08-16 | Disposition: A | Discharge status: Home / Self Care | Payer: Medicare (Managed Care) | Source: Ambulatory Visit | Attending: Urology | Admitting: Urology

## 2022-08-16 ENCOUNTER — Other Ambulatory Visit (HOSPITAL_COMMUNITY): Payer: Medicare (Managed Care)

## 2022-08-16 DIAGNOSIS — C61 Malignant neoplasm of prostate: Secondary | ICD-10-CM | POA: Insufficient documentation

## 2022-08-16 MED ORDER — PIFLIFOLASTAT F 18 (PYLARIFY) INJECTION
9.0000 | Freq: Once | INTRAVENOUS | Status: AC
  Administered 2022-08-16: 9.6 via INTRAVENOUS

## 2023-01-11 ENCOUNTER — Emergency Department (HOSPITAL_COMMUNITY)
Admission: EM | Admit: 2023-01-11 | Discharge: 2023-01-11 | Disposition: A | Discharge status: Home / Self Care | Payer: Medicare (Managed Care) | Attending: Emergency Medicine | Admitting: Emergency Medicine

## 2023-01-11 ENCOUNTER — Other Ambulatory Visit: Payer: Self-pay

## 2023-01-11 ENCOUNTER — Encounter (HOSPITAL_COMMUNITY): Payer: Self-pay

## 2023-01-11 DIAGNOSIS — F1290 Cannabis use, unspecified, uncomplicated: Secondary | ICD-10-CM | POA: Insufficient documentation

## 2023-01-11 NOTE — ED Provider Notes (Signed)
[place] EMERGENCY DEPARTMENT AT [facility] Provider Note   CSN: [id number] Arrival date & time: 01/11/23  1758     History  Chief Complaint  Patient presents with   Psychiatric Evaluation    Paddy Denes is a 62 y.o. male.  HPI   62 year old male presents emergency department with request of psychiatric evaluation.  Patient being seen by [facility] in the outpatient setting but was told he had to have psychiatric clearance prior to being seen.  Patient then elected to come to the emergency department for evaluation.  Reports last marijuana use 4 days ago without any current substance use.  Denies any auditory/visual hallucination, suicidal/homicidal ideation.  Patient without other complaint.  Denies chest pain, shortness of breath, Donnell pain, nausea, vomiting, urinary symptoms, change in bowel habits, fever, chills, night sweats.  Past medical history significant for  Home Medications Prior to Admission medications   Not on File      Allergies    Patient has no known allergies.    Review of Systems   Review of Systems  All other systems reviewed and are negative.   Physical Exam Updated Vital Signs BP 137/89   Pulse 95   Temp 98.5 F (36.9 C) (Oral)   Resp 18   Ht 6\' 1"  (1.854 m)   Wt 70.3 kg   SpO2 100%   BMI 20.45 kg/m  Physical Exam Vitals and nursing note reviewed.  Constitutional:      General: He is not in acute distress.    Appearance: He is well-developed.  HENT:     Head: Normocephalic and atraumatic.  Eyes:     Conjunctiva/sclera: Conjunctivae normal.  Cardiovascular:     Rate and Rhythm: Normal rate and regular rhythm.  Pulmonary:     Effort: Pulmonary effort is normal. No respiratory distress.     Breath sounds: Normal breath sounds.  Abdominal:     Palpations: Abdomen is soft.     Tenderness: There is no abdominal tenderness.  Musculoskeletal:        General: No swelling.     Cervical back: Neck supple.  Skin:     General: Skin is warm and dry.     Capillary Refill: Capillary refill takes less than 2 seconds.  Neurological:     Mental Status: He is alert.  Psychiatric:        Mood and Affect: Mood normal.     ED Results / Procedures / Treatments   Labs (all labs ordered are listed, but only abnormal results are displayed) Labs Reviewed - No data to display  EKG None  Radiology No results found.  Procedures Procedures    Medications Ordered in ED Medications - No data to display  ED Course/ Medical Decision Making/ A&P Clinical Course as of 01/11/23 1844  Thu Jan 11, 2023  1841 Consulted psychiatry [CR]    Clinical Course User Index [CR] Peter Garter, [place]                             Medical Decision Making  This patient presents to the ED for concern of marijuana use, this involves an extensive number of treatment options, and is a complaint that carries with it a high risk of complications and morbidity.  The differential diagnosis includes marijuana use   Co morbidities that complicate the patient evaluation  See HPI   Additional history obtained:  Additional history obtained from EMR  External records from outside source obtained and reviewed including hospital records   Lab Tests:  N/a   Imaging Studies ordered:  N/a   Cardiac Monitoring: / EKG:  The patient was maintained on a cardiac monitor.  I personally viewed and interpreted the cardiac monitored which showed an underlying rhythm of: sinus rhythm   Consultations Obtained:  I requested consultation with Louie Boston NP who recommended outpatient management of symptoms.   Problem List / ED Course / Critical interventions / Medication management  Marijuana use Reevaluation of the patient showed that the patient stayed the same I have reviewed the patients home medicines and have made adjustments as needed   Social Determinants of Health:  Warnings.  Denies other substance  use   Test / Admission - Considered:  Marijuana use Vitals signs  within normal range and stable throughout visit. 62 year old male presents emergency department with request of psychiatric evaluation.  Patient without evidence of acute psychosis or other emergent psychiatric condition.  Given patient was sent, I reach out to behavioral health NP Geralynn Ochs A [place] who recommended outpatient management of symptoms.  Will give patient resources for outpatient psychiatry as well as behavioral urgent care.  Plan discussed in length with patient and he acknowledged understanding was agreeable to said plan.  Patient overall well-appearing, afebrile in no acute distress. Worrisome signs and symptoms were discussed with the patient, and the patient acknowledged understanding to return to the ED if noticed. Patient was stable upon discharge.          Final Clinical Impression(s) / ED Diagnoses Final diagnoses:  Marijuana use    Rx / DC Orders ED Discharge Orders     None         Peter Garter, [place] 01/11/23 1844    Melene Plan, DO 01/11/23 1849

## 2023-01-11 NOTE — ED Notes (Signed)
Patient is going home by cab

## 2023-01-11 NOTE — ED Triage Notes (Signed)
Denies SI/HI

## 2023-01-11 NOTE — Social Work (Signed)
This CSW has approved a taxi voucher for the patient to return home to the address on file.

## 2023-01-11 NOTE — ED Notes (Signed)
Pt wanded by security. 

## 2023-01-11 NOTE — Discharge Instructions (Signed)
As discussed, will attach information in your discharge paperwork for outpatient follow-up with psychiatry.  I have discussed your case with provider in the behavioral field and they recommended outpatient follow-up for further evaluation of your symptoms.  Attached is information for the behavioral health urgent care as well as outpatient psychiatry.  Please not hesitate to return to emergency department if the worrisome signs and symptoms we discussed become apparent.

## 2023-01-11 NOTE — ED Notes (Signed)
Patient to room 31.  Patient cooperative. Patient oriented to unit and room. Patient wanded.

## 2023-01-11 NOTE — ED Triage Notes (Signed)
Pt sent by [facility] when seeking help with stopping the use of marijuana. [facility] wants pt psychiatrically evaluated prior to rehab. Pt denies other drug use and denies ETOH use.

## 2023-01-12 ENCOUNTER — Encounter (HOSPITAL_COMMUNITY): Payer: Self-pay

## 2023-03-27 IMAGING — CT CT HEAD W/O CM
4 series · 17 of 47 positions shown, 19 images · non-contrast
Comparison: None.

CLINICAL DATA: Headache.

EXAM:
CT HEAD WITHOUT CONTRAST
TECHNIQUE: Contiguous axial images were obtained from the base of the skull
through the vertex without intravenous contrast.

[Series 3: head wo · axial · 0.49mm/px · z∈[-138,-13]mm · 7 of 35 slices shown, 9 images]
[im 5/35  brain]
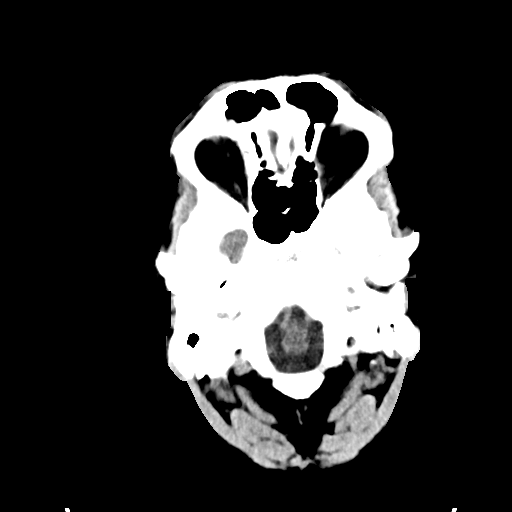
[im 5/35  bone]
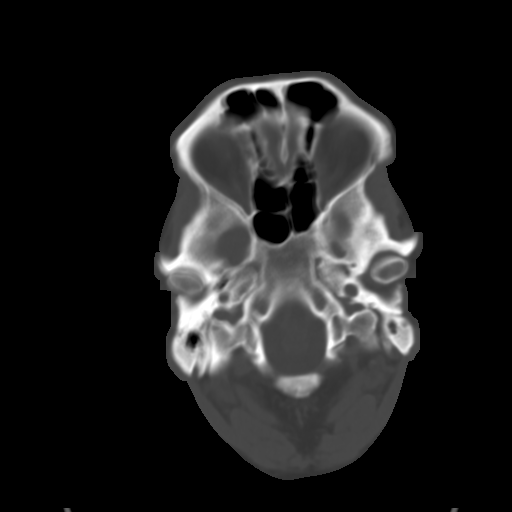
[im 9/35  brain]
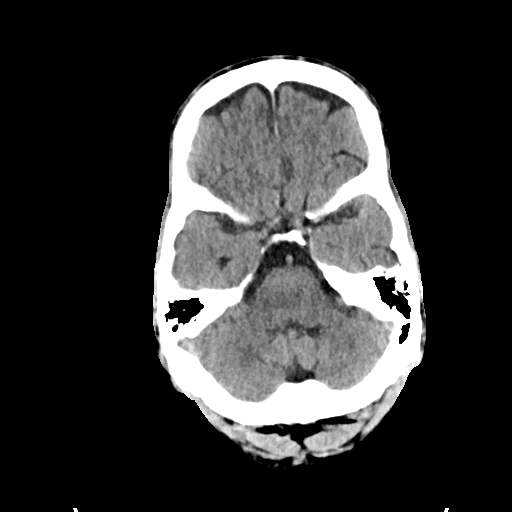
[im 13/35  brain]
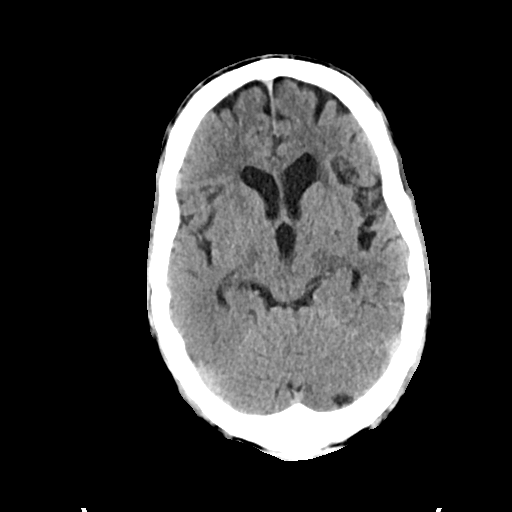
[im 18/35  brain]
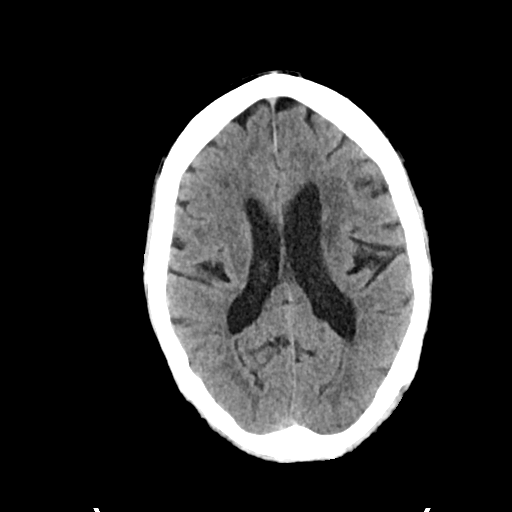
[im 22/35  brain]
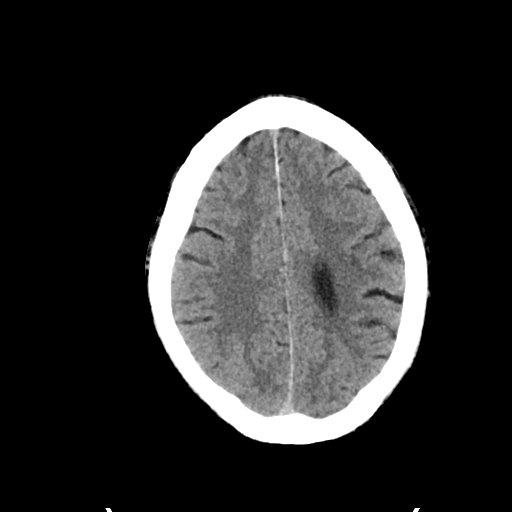
[im 22/35  bone]
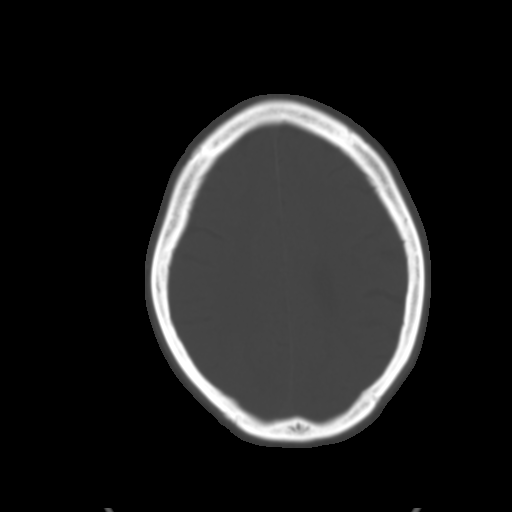
[im 26/35  brain]
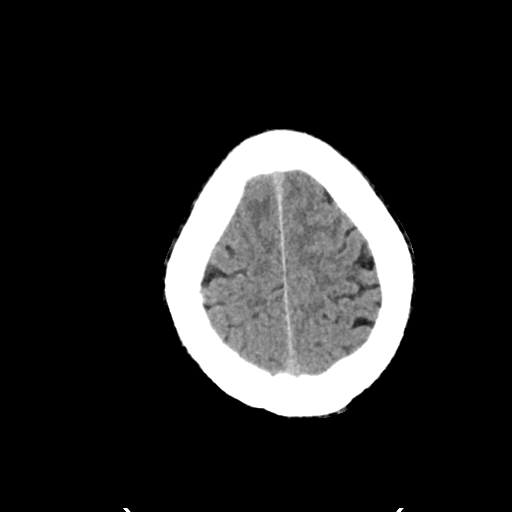
[im 30/35  brain]
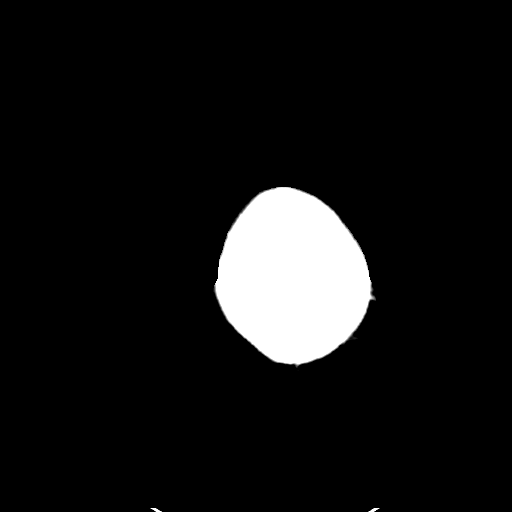

[Series 4: head bone · axial · 0.49mm/px · z∈[-142,-82]mm · 4 of 87 slices shown]
[im 9/87  bone]
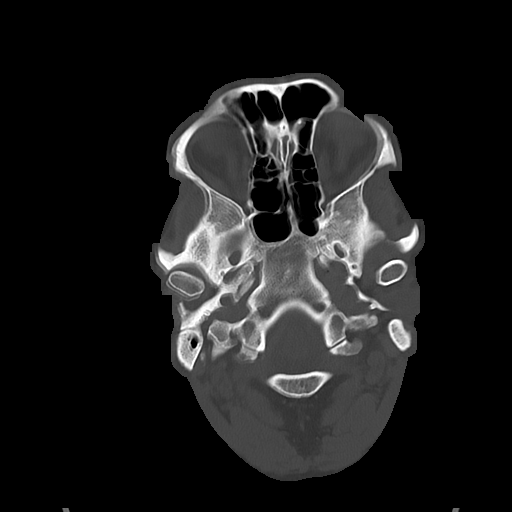
[im 18/87  bone]
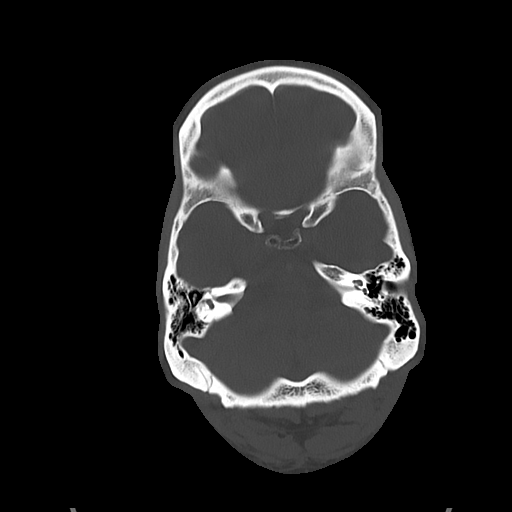
[im 26/87  bone]
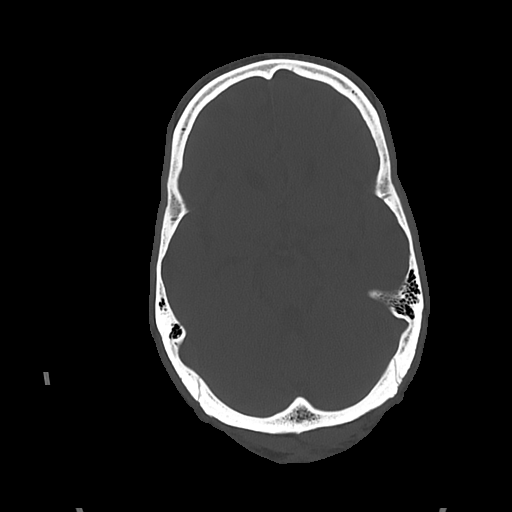
[im 39/87  bone]
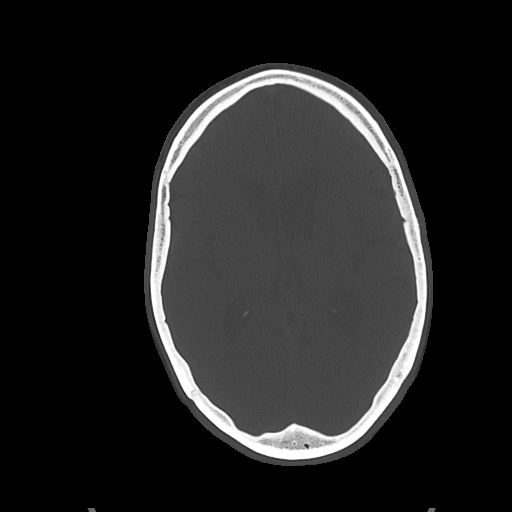

[Series 5: cor soft · coronal · 0.34mm/px · 3 of 76 slices shown]
[im 26/76  brain]
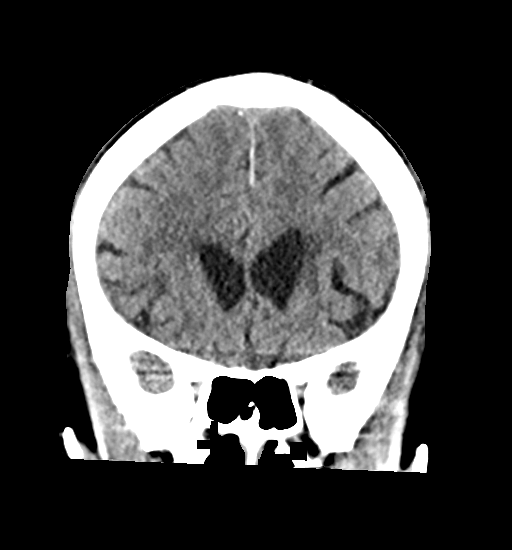
[im 34/76  brain]
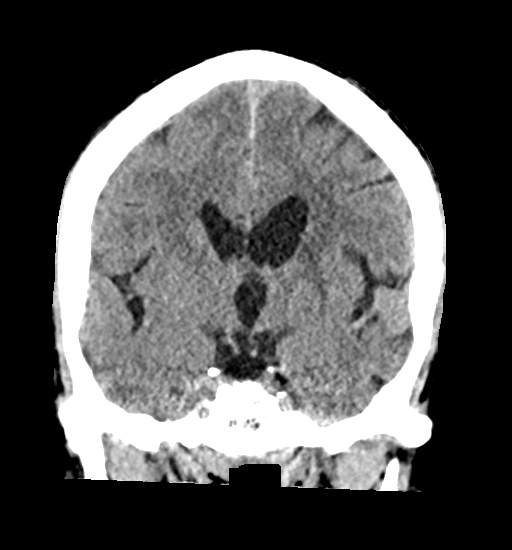
[im 42/76  brain]
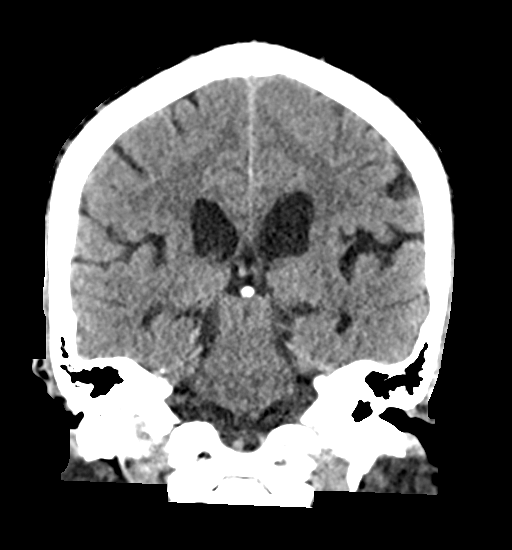

[Series 6: sag soft · sagittal · 0.37mm/px · 3 of 59 slices shown]
[im 20/59  brain]
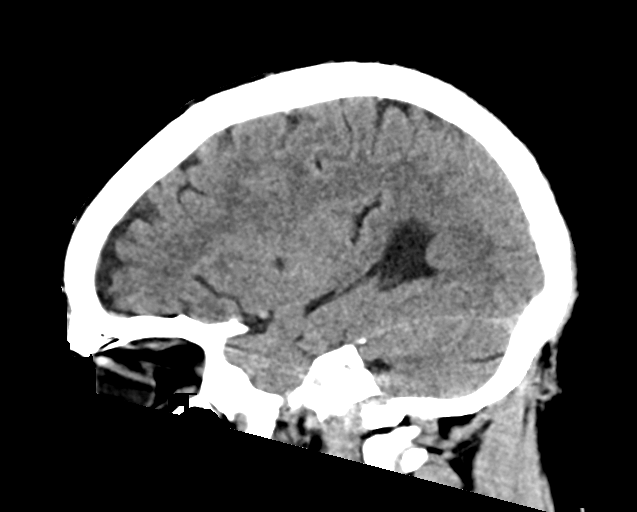
[im 30/59  brain]
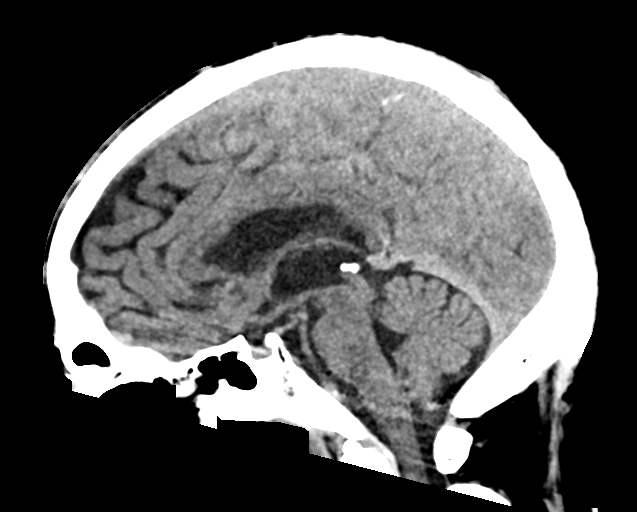
[im 39/59  brain]
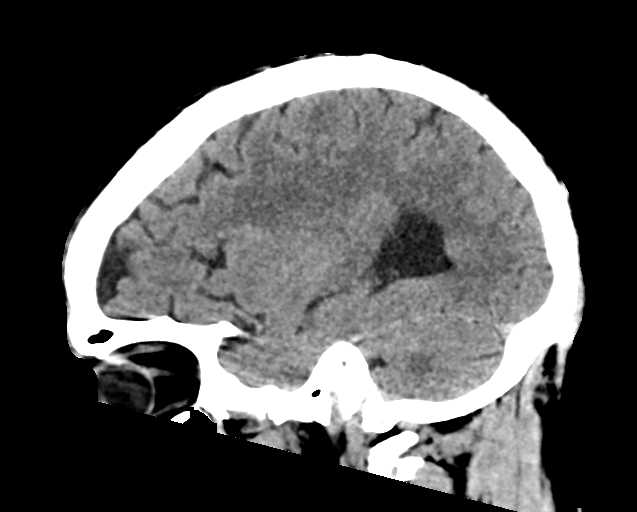

[17 of 47 positions shown; findings below may reference images not displayed]

FINDINGS: Brain: Old left periventricular white matter infarction is noted.
Old left cerebellar infarction. No mass effect or midline shift is
noted. Ventricular size is within normal limits. There is no
evidence of mass lesion, hemorrhage or acute infarction.

Vascular: No hyperdense vessel or unexpected calcification.

Skull: Normal. Negative for fracture or focal lesion.

Sinuses/Orbits: No acute finding.

Other: None.
IMPRESSION: No acute intracranial abnormality seen.

## 2023-04-16 ENCOUNTER — Encounter (HOSPITAL_COMMUNITY): Payer: Self-pay

## 2023-04-16 ENCOUNTER — Emergency Department (HOSPITAL_COMMUNITY)
Admission: EM | Admit: 2023-04-16 | Discharge: 2023-04-17 | Disposition: A | Discharge status: Home / Self Care | Payer: Medicare (Managed Care) | Attending: Emergency Medicine | Admitting: Emergency Medicine

## 2023-04-16 ENCOUNTER — Other Ambulatory Visit: Payer: Self-pay

## 2023-04-16 ENCOUNTER — Emergency Department (HOSPITAL_COMMUNITY): Payer: Medicare (Managed Care)

## 2023-04-16 DIAGNOSIS — R109 Unspecified abdominal pain: Secondary | ICD-10-CM | POA: Insufficient documentation

## 2023-04-16 DIAGNOSIS — I2510 Atherosclerotic heart disease of native coronary artery without angina pectoris: Secondary | ICD-10-CM | POA: Insufficient documentation

## 2023-04-16 DIAGNOSIS — Z8673 Personal history of transient ischemic attack (TIA), and cerebral infarction without residual deficits: Secondary | ICD-10-CM | POA: Insufficient documentation

## 2023-04-16 LAB — CBC
HCT: 35.6 % — ABNORMAL LOW (ref 39.0–52.0)
Hemoglobin: 11 g/dL — ABNORMAL LOW (ref 13.0–17.0)
MCH: 29.3 pg (ref 26.0–34.0)
MCHC: 30.9 g/dL (ref 30.0–36.0)
MCV: 94.9 fL (ref 80.0–100.0)
Platelets: 213 10*3/uL (ref 150–400)
RBC: 3.75 MIL/uL — ABNORMAL LOW (ref 4.22–5.81)
RDW: 14.6 % (ref 11.5–15.5)
WBC: 6.3 10*3/uL (ref 4.0–10.5)
nRBC: 0 % (ref 0.0–0.2)

## 2023-04-16 LAB — COMPREHENSIVE METABOLIC PANEL
ALT: 14 U/L (ref 0–44)
AST: 20 U/L (ref 15–41)
Albumin: 3.9 g/dL (ref 3.5–5.0)
Alkaline Phosphatase: 111 U/L (ref 38–126)
Anion gap: 9 (ref 5–15)
BUN: 29 mg/dL — ABNORMAL HIGH (ref 8–23)
CO2: 24 mmol/L (ref 22–32)
Calcium: 9.2 mg/dL (ref 8.9–10.3)
Chloride: 105 mmol/L (ref 98–111)
Creatinine, Ser: 1.27 mg/dL — ABNORMAL HIGH (ref 0.61–1.24)
GFR, Estimated: >60 mL/min (ref >60)
Glucose, Bld: 93 mg/dL (ref 70–99)
Potassium: 4.1 mmol/L (ref 3.5–5.1)
Sodium: 138 mmol/L (ref 135–145)
Total Bilirubin: 0.2 mg/dL — ABNORMAL LOW (ref 0.3–1.2)
Total Protein: 7.6 g/dL (ref 6.5–8.1)

## 2023-04-16 LAB — I-STAT CHEM 8, ED
BUN: 30 mg/dL — ABNORMAL HIGH (ref 8–23)
Calcium, Ion: 1.17 mmol/L (ref 1.15–1.40)
Chloride: 108 mmol/L (ref 98–111)
Creatinine, Ser: 1.3 mg/dL — ABNORMAL HIGH (ref 0.61–1.24)
Glucose, Bld: 88 mg/dL (ref 70–99)
HCT: 35 % — ABNORMAL LOW (ref 39.0–52.0)
Hemoglobin: 11.9 g/dL — ABNORMAL LOW (ref 13.0–17.0)
Potassium: 4.2 mmol/L (ref 3.5–5.1)
Sodium: 141 mmol/L (ref 135–145)
TCO2: 24 mmol/L (ref 22–32)

## 2023-04-16 MED ORDER — MORPHINE SULFATE (PF) 2 MG/ML IV SOLN
2.0000 mg | Freq: Once | INTRAVENOUS | Status: AC
  Administered 2023-04-16: 2 mg via INTRAVENOUS
  Filled 2023-04-16: qty 1

## 2023-04-16 MED ORDER — IOHEXOL 350 MG/ML SOLN
75.0000 mL | Freq: Once | INTRAVENOUS | Status: AC | PRN
  Administered 2023-04-16: 75 mL via INTRAVENOUS

## 2023-04-16 NOTE — ED Provider Notes (Signed)
[place] EMERGENCY DEPARTMENT AT [facility] Provider Note   CSN: [id number] Arrival date & time: 04/16/23  1550     History Chief Complaint  Patient presents with   Abdominal Pain    HPI Chad Santos is a 62 y.o. male presenting for chief complaint of abdominal pain.  States that he had severe abdominal pain and inability to defecate x 2 days. Extensive medical history including GI bleed, CAD, CVA, baseline weakness requiring care at a skilled nursing facility. He denies fevers chills nausea vomiting syncope or shortness of breath.  Worsening severe discomfort.  Patient's recorded medical, surgical, social, medication list and allergies were reviewed in the Snapshot window as part of the initial history.   Review of Systems   Review of Systems  Constitutional:  Negative for chills and fever.  HENT:  Negative for ear pain and sore throat.   Eyes:  Negative for pain and visual disturbance.  Respiratory:  Negative for cough and shortness of breath.   Cardiovascular:  Negative for chest pain and palpitations.  Gastrointestinal:  Positive for abdominal pain and constipation. Negative for nausea and vomiting.  Genitourinary:  Negative for dysuria and hematuria.  Musculoskeletal:  Negative for arthralgias and back pain.  Skin:  Negative for color change and rash.  Neurological:  Negative for seizures and syncope.  All other systems reviewed and are negative.   Physical Exam Updated Vital Signs BP (!) 164/105 (BP Location: Right Arm)   Pulse 81   Temp 98.2 F (36.8 C)   Resp 18   SpO2 95%  Physical Exam Vitals and nursing note reviewed.  Constitutional:      General: He is not in acute distress.    Appearance: He is well-developed.  HENT:     Head: Normocephalic and atraumatic.  Eyes:     Conjunctiva/sclera: Conjunctivae normal.  Cardiovascular:     Rate and Rhythm: Normal rate and regular rhythm.     Heart sounds: No murmur heard. Pulmonary:      Effort: Pulmonary effort is normal. No respiratory distress.     Breath sounds: Normal breath sounds.  Abdominal:     Palpations: Abdomen is soft.     Tenderness: There is no abdominal tenderness. There is no guarding.  Musculoskeletal:        General: No swelling.     Cervical back: Neck supple.  Skin:    General: Skin is warm and dry.     Capillary Refill: Capillary refill takes less than 2 seconds.  Neurological:     Mental Status: He is alert.  Psychiatric:        Mood and Affect: Mood normal.      ED Course/ Medical Decision Making/ A&P    Procedures Procedures   Medications Ordered in ED Medications  morphine (PF) 2 MG/ML injection 2 mg (2 mg Intravenous Given 04/16/23 1820)  iohexol (OMNIPAQUE) 350 MG/ML injection 75 mL (75 mLs Intravenous Contrast Given 04/16/23 1903)   Medical Decision Making:   [name] is a 62 y.o. male who presented to the ED today with abdominal pain, detailed above.    Complete initial physical exam performed, notably the patient  was HDS in NAD.    Reviewed and confirmed nursing documentation for past medical history, family history, social history.    Initial Assessment:   With the patient's presentation of abdominal pain, most likely diagnosis is nonspecific etiology/constipation.. Other diagnoses were considered including (but not limited to) gastroenteritis, colitis, small bowel obstruction,  appendicitis, cholecystitis, pancreatitis, nephrolithiasis, UTI, pyleonephritis. These are considered less likely due to history of present illness and physical exam findings.   This is most consistent with an acute life/limb threatening illness complicated by underlying chronic conditions.   Initial Plan:  CBC/CMP to evaluate for underlying infectious/metabolic etiology for patient's abdominal pain  Lipase to evaluate for pancreatitis  EKG to evaluate for cardiac source of pain  CTAB/Pelvis with contrast to evaluate for structural/surgical  etiology of patients' severe abdominal pain.  Urinalysis and repeat physical assessment to evaluate for UTI/Pyelonpehritis  Empiric management of symptoms with escalating pain control and antiemetics as needed.   Initial Study Results:   Laboratory  All laboratory results reviewed without evidence of clinically relevant pathology.    EKG EKG was reviewed independently. Rate, rhythm, axis, intervals all examined and without medically relevant abnormality. ST segments without concerns for elevations.    Radiology All images reviewed independently. Agree with radiology report at this time.   CT ABDOMEN PELVIS W CONTRAST  Result Date: 04/16/2023 CLINICAL DATA:  Abdomen pain EXAM: CT ABDOMEN AND PELVIS WITH CONTRAST TECHNIQUE: Multidetector CT imaging of the abdomen and pelvis was performed using the standard protocol following bolus administration of intravenous contrast. RADIATION DOSE REDUCTION: This exam was performed according to the departmental dose-optimization program which includes automated exposure control, adjustment of the mA and/or kV according to patient size and/or use of iterative reconstruction technique. CONTRAST:  75mL OMNIPAQUE IOHEXOL 350 MG/ML SOLN COMPARISON:  PET CT 08/16/2022 FINDINGS: Lower chest: Lung bases demonstrate no acute airspace disease. Coronary vascular calcification Hepatobiliary: No calcified gallstone. No focal hepatic abnormality. No biliary dilatation Pancreas: Unremarkable. No pancreatic ductal dilatation or surrounding inflammatory changes. Spleen: Normal in size without focal abnormality. Adrenals/Urinary Tract: Adrenal glands are within normal limits. Kidneys demonstrate no hydronephrosis. Cysts and subcentimeter hypodensities too small to further characterize, no imaging follow-up is recommended. Slightly thick-walled urinary bladder Stomach/Bowel: Stomach nonenlarged. No dilated small bowel. No acute bowel wall thickening Vascular/Lymphatic: Moderate aortic  atherosclerosis. No aneurysm. No enlarging lymph nodes. Reproductive: Slightly enlarged prostate Other: Negative for pelvic effusion or free air. 2.1 cm subcutaneous lesion within the right buttock. Musculoskeletal: No acute or suspicious osseous abnormality. IMPRESSION: 1. Negative for bowel obstruction or acute bowel wall thickening. No CT evidence for acute intra-abdominal or pelvic abnormality. 2. Slightly thick-walled urinary bladder. 3. 2.1 cm subcutaneous lesion within the right buttock, correlate with physical exam. Electronically Signed   By: Jasmine Pang M.D.   On: 04/16/2023 20:46    Final Reassessment and Plan:   Patient reassessed multiple times. Ambulatory tolerating PO intake in NAD.  Disposition:  I have considered need for hospitalization, however, considering all of the above, I believe this patient is stable for discharge at this time.  Patient/family educated about specific return precautions for given chief complaint and symptoms.  Patient/family educated about follow-up with PCP.     Patient/family expressed understanding of return precautions and need for follow-up. Patient spoken to regarding all imaging and laboratory results and appropriate follow up for these results. All education provided in verbal form with additional information in written form. Time was allowed for answering of patient questions. Patient discharged.    Emergency Department Medication Summary:   Medications  morphine (PF) 2 MG/ML injection 2 mg (2 mg Intravenous Given 04/16/23 1820)  iohexol (OMNIPAQUE) 350 MG/ML injection 75 mL (75 mLs Intravenous Contrast Given 04/16/23 1903)      Clinical Impression:  1. Abdominal pain, unspecified abdominal location  Discharge   Final Clinical Impression(s) / ED Diagnoses Final diagnoses:  Abdominal pain, unspecified abdominal location    Rx / DC Orders ED Discharge Orders     None         Glyn Ade, MD 04/17/23 0030

## 2023-04-16 NOTE — ED Triage Notes (Signed)
Patient BIB [facility] from [facility] for constipation causing abdominal pain d/t not pooping x 2 days. Last BM Saturday, patient reports meds given at facility did not help. VSS, no other complaints at this time. Slightly hypertensive 180/102.

## 2023-04-16 NOTE — ED Notes (Signed)
Attempted to call [facility]. No answer.

## 2023-04-17 MED ORDER — TRAZODONE HCL 50 MG PO TABS
150.0000 mg | ORAL_TABLET | Freq: Every evening | ORAL | Status: DC | PRN
  Administered 2023-04-17: 150 mg via ORAL
  Filled 2023-04-17: qty 3

## 2023-05-25 ENCOUNTER — Emergency Department (HOSPITAL_BASED_OUTPATIENT_CLINIC_OR_DEPARTMENT_OTHER)
Admit: 2023-05-25 | Discharge: 2023-05-25 | Disposition: A | Discharge status: Home / Self Care | Payer: Medicare (Managed Care) | Attending: Emergency Medicine | Admitting: Emergency Medicine

## 2023-05-25 ENCOUNTER — Emergency Department (HOSPITAL_COMMUNITY)
Admission: EM | Admit: 2023-05-25 | Discharge: 2023-05-25 | Disposition: A | Discharge status: Home / Self Care | Payer: Medicare (Managed Care) | Attending: Emergency Medicine | Admitting: Emergency Medicine

## 2023-05-25 ENCOUNTER — Encounter (HOSPITAL_COMMUNITY): Payer: Self-pay

## 2023-05-25 ENCOUNTER — Other Ambulatory Visit: Payer: Self-pay

## 2023-05-25 ENCOUNTER — Emergency Department (HOSPITAL_COMMUNITY): Payer: Medicare (Managed Care)

## 2023-05-25 DIAGNOSIS — Z7901 Long term (current) use of anticoagulants: Secondary | ICD-10-CM | POA: Insufficient documentation

## 2023-05-25 DIAGNOSIS — M79605 Pain in left leg: Secondary | ICD-10-CM

## 2023-05-25 DIAGNOSIS — M79652 Pain in left thigh: Secondary | ICD-10-CM | POA: Diagnosis present

## 2023-05-25 DIAGNOSIS — Z79899 Other long term (current) drug therapy: Secondary | ICD-10-CM | POA: Diagnosis not present

## 2023-05-25 DIAGNOSIS — I10 Essential (primary) hypertension: Secondary | ICD-10-CM | POA: Insufficient documentation

## 2023-05-25 DIAGNOSIS — M1612 Unilateral primary osteoarthritis, left hip: Secondary | ICD-10-CM | POA: Diagnosis not present

## 2023-05-25 HISTORY — DX: Cerebral infarction, unspecified: I63.9

## 2023-05-25 LAB — BASIC METABOLIC PANEL
Anion gap: 9 (ref 5–15)
BUN: 31 mg/dL — ABNORMAL HIGH (ref 8–23)
CO2: 25 mmol/L (ref 22–32)
Calcium: 9.2 mg/dL (ref 8.9–10.3)
Chloride: 104 mmol/L (ref 98–111)
Creatinine, Ser: 1.32 mg/dL — ABNORMAL HIGH (ref 0.61–1.24)
GFR, Estimated: >60 mL/min (ref >60)
Glucose, Bld: 87 mg/dL (ref 70–99)
Potassium: 3.8 mmol/L (ref 3.5–5.1)
Sodium: 138 mmol/L (ref 135–145)

## 2023-05-25 LAB — CBC
HCT: 34.4 % — ABNORMAL LOW (ref 39.0–52.0)
Hemoglobin: 10.8 g/dL — ABNORMAL LOW (ref 13.0–17.0)
MCH: 29.5 pg (ref 26.0–34.0)
MCHC: 31.4 g/dL (ref 30.0–36.0)
MCV: 94 fL (ref 80.0–100.0)
Platelets: 212 10*3/uL (ref 150–400)
RBC: 3.66 MIL/uL — ABNORMAL LOW (ref 4.22–5.81)
RDW: 14.6 % (ref 11.5–15.5)
WBC: 6.4 10*3/uL (ref 4.0–10.5)
nRBC: 0 % (ref 0.0–0.2)

## 2023-05-25 MED ORDER — OXYCODONE-ACETAMINOPHEN 5-325 MG PO TABS
1.0000 | ORAL_TABLET | Freq: Four times a day (QID) | ORAL | 0 refills | Status: DC | PRN
Start: 2023-05-25 — End: 2023-05-25

## 2023-05-25 MED ORDER — LIDOCAINE 5 % EX PTCH: 1.0000 | MEDICATED_PATCH | CUTANEOUS | 0 refills | Status: AC

## 2023-05-25 MED ORDER — OXYCODONE-ACETAMINOPHEN 5-325 MG PO TABS
1.0000 | ORAL_TABLET | Freq: Four times a day (QID) | ORAL | 0 refills | Status: AC | PRN
Start: 2023-05-25 — End: ?

## 2023-05-25 MED ORDER — OXYCODONE-ACETAMINOPHEN 5-325 MG PO TABS
1.0000 | ORAL_TABLET | Freq: Once | ORAL | Status: AC
  Administered 2023-05-25: 1 via ORAL
  Filled 2023-05-25: qty 1

## 2023-05-25 MED ORDER — LIDOCAINE 5 % EX PTCH: 1.0000 | MEDICATED_PATCH | CUTANEOUS | 0 refills | Status: DC

## 2023-05-25 MED ORDER — LIDOCAINE 5 % EX PTCH
1.0000 | MEDICATED_PATCH | CUTANEOUS | Status: DC
  Administered 2023-05-25: 1 via TRANSDERMAL
  Filled 2023-05-25: qty 1

## 2023-05-25 MED ORDER — ACETAMINOPHEN 325 MG PO TABS
650.0000 mg | ORAL_TABLET | Freq: Once | ORAL | Status: AC
  Administered 2023-05-25: 650 mg via ORAL
  Filled 2023-05-25: qty 2

## 2023-05-25 NOTE — Discharge Instructions (Addendum)
Take the medications as needed for pain.  Follow-up with the orthopedic doctor for further evaluation of your hip arthritis as we discussed

## 2023-05-25 NOTE — Progress Notes (Signed)
Left lower extremity venous duplex has been completed. Preliminary results can be found in CV Proc through chart review.  Results were given to Dr. Lynelle Doctor.  05/25/23 10:56 AM Olen Cordial RVT

## 2023-05-25 NOTE — ED Triage Notes (Signed)
Pt present to ED from [facility] with c/o left thigh pain worsening at 0800. Pt denies trauma to left thigh and leg. Pt A&Ox3 at this time.

## 2023-05-25 NOTE — ED Provider Notes (Signed)
[place] EMERGENCY DEPARTMENT AT [facility] Provider Note   CSN: [id number] Arrival date & time: 05/25/23  1016     History  Chief Complaint  Patient presents with   Leg Pain    Chad Santos is a 62 y.o. male.   Leg Pain    Patient has a history of hypertension osteoarthritis in the hip, stroke.  He presents to the ED with complaints of left thigh pain.  Patient states it started in the past week.  It was worse this morning.  Patient states the pain is in the anterior part of his left thigh.  He has not noticed any redness or swelling.  Pain does not radiate anywhere.  He is not having any back pain.  No numbness or weakness.  The pain does increase with movement and hurts for him to walk on it  Home Medications Prior to Admission medications   Medication Sig Start Date End Date Taking? Authorizing Provider  abiraterone acetate (ZYTIGA) 250 MG tablet Take 250 mg by mouth in the morning, at noon, in the evening, and at bedtime. Take on an empty stomach 1 hour before or 2 hours after a meal   Yes [provider]  acetaminophen (TYLENOL) 500 MG tablet Take 1,000 mg by mouth every 8 (eight) hours.   Yes [provider]  alum & mag hydroxide-simeth (MAALOX/MYLANTA) 200-200-20 MG/5ML suspension Take 30 mLs by mouth every 4 (four) hours as needed for indigestion or heartburn.   Yes [provider]  amLODipine (NORVASC) 10 MG tablet Take 10 mg by mouth daily.   Yes [provider]  atorvastatin (LIPITOR) 80 MG tablet Take 1 tablet (80 mg total) by mouth at bedtime. 01/09/22  Yes Setzer, Lynnell Jude, PA-C  bacitracin ointment Apply 1 Application topically 2 (two) times daily. 01/09/22  Yes Setzer, Lynnell Jude, PA-C  butalbital-aspirin-caffeine [facility]) [id number] MG capsule Take 1 capsule by mouth every 6 (six) hours as needed for headache.   Yes [provider]  calcium carbonate (TUMS - DOSED IN MG ELEMENTAL CALCIUM) 500 MG chewable  tablet Chew 2 tablets by mouth daily.   Yes [provider]  cloNIDine (CATAPRES) 0.1 MG tablet Take 0.1 mg by mouth 2 (two) times daily.   Yes [provider]  clopidogrel (PLAVIX) 75 MG tablet Take 1 tablet (75 mg total) by mouth daily. 01/09/22  Yes Setzer, Lynnell Jude, PA-C  cyclobenzaprine (FLEXERIL) 5 MG tablet Take 5 mg by mouth every 8 (eight) hours as needed for muscle spasms.   Yes [provider]  DULoxetine (CYMBALTA) 30 MG capsule Take 30 mg by mouth daily.   Yes [provider]  lactulose (CHRONULAC) 10 GM/15ML solution Take 10 g by mouth every 12 (twelve) hours as needed for mild constipation, moderate constipation or severe constipation.   Yes [provider]  lidocaine (LIDODERM) 5 % Place 1 patch onto the skin daily. Remove & Discard patch within 12 hours or as directed by MD 05/25/23  Yes Linwood Dibbles, MD  losartan (COZAAR) 50 MG tablet Take 100 mg by mouth daily.   Yes [provider]  mirtazapine (REMERON) 30 MG tablet Take 30 mg by mouth at bedtime.   Yes [provider]  nicotine (NICODERM CQ - DOSED IN MG/24 HOURS) 21 mg/24hr patch Place 21 mg onto the skin daily.   Yes [provider]  omeprazole (PRILOSEC OTC) 20 MG tablet Take 20 mg by mouth in the morning and at bedtime.  Yes [provider]  ondansetron (ZOFRAN) 4 MG tablet Take 4 mg by mouth every 6 (six) hours as needed for nausea or vomiting.   Yes [provider]  oxyCODONE-acetaminophen (PERCOCET/ROXICET) 5-325 MG tablet Take 1 tablet by mouth every 6 (six) hours as needed for severe pain (pain score 7-10). 05/25/23  Yes Linwood Dibbles, MD  polyethylene glycol (MIRALAX / GLYCOLAX) 17 g packet Take 17 g by mouth daily as needed for mild constipation, moderate constipation or severe constipation.   Yes [provider]  predniSONE (DELTASONE) 5 MG tablet Take 5 mg by mouth daily with breakfast.   Yes [provider]   traZODone (DESYREL) 150 MG tablet Take 1 tablet (150 mg total) by mouth at bedtime as needed for sleep. Patient taking differently: Take 150 mg by mouth at bedtime. 05/08/22  Yes Debby Freiberg, NP      Allergies    Patient has no known allergies.    Review of Systems   Review of Systems  Physical Exam Updated Vital Signs BP (!) 171/88   Pulse 62   Temp 98.2 F (36.8 C) (Oral)   Resp 13   Ht 1.854 m (6\' 1" )   Wt 70 kg   SpO2 100%   BMI 20.36 kg/m  Physical Exam Vitals and nursing note reviewed.  Constitutional:      General: He is not in acute distress.    Appearance: He is well-developed.  HENT:     Head: Normocephalic and atraumatic.     Right Ear: External ear normal.     Left Ear: External ear normal.  Eyes:     General: No scleral icterus.       Right eye: No discharge.        Left eye: No discharge.     Conjunctiva/sclera: Conjunctivae normal.  Neck:     Trachea: No tracheal deviation.  Cardiovascular:     Rate and Rhythm: Normal rate.  Pulmonary:     Effort: Pulmonary effort is normal.     Breath sounds: No stridor.  Abdominal:     General: There is no distension.  Musculoskeletal:        General: Tenderness present. No swelling or deformity.     Cervical back: Neck supple.     Comments: Extremities warm and well-perfused, pulses, normal sensation  Skin:    General: Skin is warm and dry.     Findings: No rash.  Neurological:     Mental Status: He is alert. Mental status is at baseline.     Cranial Nerves: No dysarthria or facial asymmetry.     Motor: No seizure activity.     Comments: Sensation intact lower extremity, able to plantarflex dorsiflex,     ED Results / Procedures / Treatments   Labs (all labs ordered are listed, but only abnormal results are displayed) Labs Reviewed  CBC - Abnormal; Notable for the following components:      Result Value   RBC 3.66 (*)    Hemoglobin 10.8 (*)    HCT 34.4 (*)    All other components within  normal limits  BASIC METABOLIC PANEL - Abnormal; Notable for the following components:   BUN 31 (*)    Creatinine, Ser 1.32 (*)    All other components within normal limits    EKG None  Radiology DG FEMUR MIN 2 VIEWS LEFT  Result Date: 05/25/2023 CLINICAL DATA:  Left thigh pain. EXAM: LEFT FEMUR 2 VIEWS COMPARISON:  CT  abdomen/pelvis dated April 16, 2023. FINDINGS: No definite acute fracture identified. Mild lateral subluxation of the femoral head relative to the acetabulum. Severe superior joint space narrowing of the left hip resulting in bone-on-bone contact, subchondral sclerosis/cystic changes, and marginal osteophytosis. These findings are grossly similar compared to the prior CT abdomen/pelvis dated April 16, 2023. The knee joint is anatomically aligned. Vascular calcifications are present. IMPRESSION: 1. Severe arthritic changes of the left hip resulting in bone-on-bone contact and prominent subchondral sclerosis and cystic changes. There is mild lateral subluxation of the femoral head relative to the acetabulum. 2. No definite acute fracture identified. Electronically Signed   By: Hart Robinsons M.D.   On: 05/25/2023 12:20   VAS [place] LOWER EXTREMITY VENOUS (DVT) (7a-7p)  Result Date: 05/25/2023  Lower Venous DVT Study Patient Name:  ERICSON NAFZIGER  Date of Exam:   05/25/2023 Medical Rec #: [id number]         Accession #:    [id number] Date of Birth: 10-26-1960          Patient Gender: M Patient Age:   82 years Exam Location:  [facility] Procedure:      VAS [place] LOWER EXTREMITY VENOUS (DVT) Referring Phys: Thessaly Mccullers --------------------------------------------------------------------------------  Indications: Pain.  Risk Factors: None identified. Limitations: Poor ultrasound/tissue interface. Comparison Study: No prior studies. Performing Technologist: Chanda Busing RVT  Examination Guidelines: A complete evaluation includes B-mode imaging, spectral Doppler, color Doppler, and  power Doppler as needed of all accessible portions of each vessel. Bilateral testing is considered an integral part of a complete examination. Limited examinations for reoccurring indications may be performed as noted. The reflux portion of the exam is performed with the patient in reverse Trendelenburg.  +-----+---------------+---------+-----------+----------+--------------+ RIGHTCompressibilityPhasicitySpontaneityPropertiesThrombus Aging +-----+---------------+---------+-----------+----------+--------------+ CFV  Full           Yes      Yes                                 +-----+---------------+---------+-----------+----------+--------------+   +---------+---------------+---------+-----------+----------+--------------+ LEFT     CompressibilityPhasicitySpontaneityPropertiesThrombus Aging +---------+---------------+---------+-----------+----------+--------------+ CFV      Full           Yes      Yes                                 +---------+---------------+---------+-----------+----------+--------------+ SFJ      Full                                                        +---------+---------------+---------+-----------+----------+--------------+ FV Prox  Full                                                        +---------+---------------+---------+-----------+----------+--------------+ FV Mid   Full                                                        +---------+---------------+---------+-----------+----------+--------------+  FV DistalFull                                                        +---------+---------------+---------+-----------+----------+--------------+ PFV      Full                                                        +---------+---------------+---------+-----------+----------+--------------+ POP      Full           Yes      Yes                                  +---------+---------------+---------+-----------+----------+--------------+ PTV      Full                                                        +---------+---------------+---------+-----------+----------+--------------+ PERO     Full                                                        +---------+---------------+---------+-----------+----------+--------------+    Summary: RIGHT: - No evidence of common femoral vein obstruction.   LEFT: - There is no evidence of deep vein thrombosis in the lower extremity.  - No cystic structure found in the popliteal fossa.  *See table(s) above for measurements and observations.    Preliminary     Procedures Procedures    Medications Ordered in ED Medications  acetaminophen (TYLENOL) tablet 650 mg (650 mg Oral Given 05/25/23 1037)  oxyCODONE-acetaminophen (PERCOCET/ROXICET) 5-325 MG per tablet 1 tablet (1 tablet Oral Given 05/25/23 1208)    ED Course/ Medical Decision Making/ A&P Clinical Course as of 05/25/23 1256  Fri May 25, 2023  1129 Doppler study negative for DVT [JK]  1145 CBC(!) CBC shows stable anemia. [JK]  1145 Basic metabolic panel(!) Metabolic panel shows stable renal function [JK]  1227 Femur x-rays without signs of acute injury.  Patient does have severe arthritic changes in the left hip [JK]    Clinical Course User Index [JK] Linwood Dibbles, MD                                 Medical Decision Making Differential diagnosis includes but not limited to DVT, cellulitis, musculoskeletal pain  Problems Addressed: Osteoarthritis of left hip, unspecified osteoarthritis type: acute illness or injury that poses a threat to life or bodily functions  Amount and/or Complexity of Data Reviewed Labs: ordered. Decision-making details documented in ED Course. Radiology: ordered and independent interpretation performed.  Risk OTC drugs. Prescription drug management.   Patient presented with complaints of left thigh pain.  Exam not  suggestive of acute infection.  No swelling noted.  Doppler study does not  show DVT.  Patient has normal pulses and no signs of acute vascular compromise.  X-rays do show evidence of severe osteoarthritis.  I suspect this is the primary reason for the patient's hip pain.  Will discharge home with medications for pain.  Recommend outpatient follow-up with orthopedics for further evaluation        Final Clinical Impression(s) / ED Diagnoses Final diagnoses:  Osteoarthritis of left hip, unspecified osteoarthritis type    Rx / DC Orders ED Discharge Orders          Ordered    lidocaine (LIDODERM) 5 %  Every 24 hours        05/25/23 1254    oxyCODONE-acetaminophen (PERCOCET/ROXICET) 5-325 MG tablet  Every 6 hours PRN        05/25/23 1254              Linwood Dibbles, MD 05/25/23 1256

## 2023-05-25 NOTE — ED Notes (Signed)
Report given to [place], nurse-[facility]

## 2023-06-12 ENCOUNTER — Emergency Department (HOSPITAL_COMMUNITY)
Admission: EM | Admit: 2023-06-12 | Discharge: 2023-06-12 | Disposition: A | Discharge status: Home / Self Care | Payer: Medicare (Managed Care) | Attending: Emergency Medicine | Admitting: Emergency Medicine

## 2023-06-12 ENCOUNTER — Encounter (HOSPITAL_COMMUNITY): Payer: Self-pay

## 2023-06-12 ENCOUNTER — Emergency Department (HOSPITAL_COMMUNITY): Payer: Medicare (Managed Care)

## 2023-06-12 ENCOUNTER — Other Ambulatory Visit: Payer: Self-pay

## 2023-06-12 DIAGNOSIS — W19XXXA Unspecified fall, initial encounter: Secondary | ICD-10-CM | POA: Diagnosis not present

## 2023-06-12 DIAGNOSIS — M79651 Pain in right thigh: Secondary | ICD-10-CM | POA: Diagnosis present

## 2023-06-12 DIAGNOSIS — G729 Myopathy, unspecified: Secondary | ICD-10-CM | POA: Diagnosis not present

## 2023-06-12 MED ORDER — KETOROLAC TROMETHAMINE 15 MG/ML IJ SOLN
15.0000 mg | Freq: Once | INTRAMUSCULAR | Status: AC
  Administered 2023-06-12: 15 mg via INTRAMUSCULAR
  Filled 2023-06-12: qty 1

## 2023-06-12 NOTE — ED Notes (Signed)
6:21 PM First Attempt made to call report to [facility].

## 2023-06-12 NOTE — ED Provider Notes (Signed)
[place] EMERGENCY DEPARTMENT AT [facility] Provider Note   CSN: [id number] Arrival date & time: 06/12/23  1427     History  Chief Complaint  Patient presents with   Leg Pain    Lisandro Scalzitti is a 62 y.o. male.  Patient is a 62 year old male with past medical history as presented below presenting for complaints of leg pain.  Patient admits to bilateral thigh pain on the lateral sides for 2 to 4 weeks.  Patient was seen in emergency department on 05/25/2023 for similar complaints.  Today he was brought to the emergency department because he states his leg gave out and he was found on the floor at [facility].  At this time patient denies any bony tenderness.  Admits to muscle pain in the bilateral upper thighs.  He does have some left lower extremity swelling as compared to the right that he states is chronic in nature.  Chart review demonstrates on 05/25/2023 he had a lower extremity ultrasound that ruled out DVT.  Denies rashes or skin color changes.  Denies swelling.  Denies joint pain.  The patient is on a statin.  The history is provided by the patient. No language interpreter was used.  Leg Pain Associated symptoms: no back pain and no fever        Home Medications Prior to Admission medications   Medication Sig Start Date End Date Taking? Authorizing Provider  abiraterone acetate (ZYTIGA) 250 MG tablet Take 250 mg by mouth in the morning, at noon, in the evening, and at bedtime. Take on an empty stomach 1 hour before or 2 hours after a meal    [provider]  acetaminophen (TYLENOL) 500 MG tablet Take 1,000 mg by mouth every 8 (eight) hours.    [provider]  alum & mag hydroxide-simeth (MAALOX/MYLANTA) 200-200-20 MG/5ML suspension Take 30 mLs by mouth every 4 (four) hours as needed for indigestion or heartburn.    [provider]  amLODipine (NORVASC) 10 MG tablet Take 10 mg by mouth daily.    [provider]  atorvastatin  (LIPITOR) 80 MG tablet Take 1 tablet (80 mg total) by mouth at bedtime. 01/09/22   Setzer, Lynnell Jude, PA-C  bacitracin ointment Apply 1 Application topically 2 (two) times daily. 01/09/22   Setzer, Lynnell Jude, PA-C  butalbital-aspirin-caffeine [facility]) [phone] MG capsule Take 1 capsule by mouth every 6 (six) hours as needed for headache.    [provider]  calcium carbonate (TUMS - DOSED IN MG ELEMENTAL CALCIUM) 500 MG chewable tablet Chew 2 tablets by mouth daily.    [provider]  cloNIDine (CATAPRES) 0.1 MG tablet Take 0.1 mg by mouth 2 (two) times daily.    [provider]  clopidogrel (PLAVIX) 75 MG tablet Take 1 tablet (75 mg total) by mouth daily. 01/09/22   Setzer, Lynnell Jude, PA-C  cyclobenzaprine (FLEXERIL) 5 MG tablet Take 5 mg by mouth every 8 (eight) hours as needed for muscle spasms.    [provider]  DULoxetine (CYMBALTA) 30 MG capsule Take 30 mg by mouth daily.    [provider]  lactulose (CHRONULAC) 10 GM/15ML solution Take 10 g by mouth every 12 (twelve) hours as needed for mild constipation, moderate constipation or severe constipation.    [provider]  lidocaine (LIDODERM) 5 % Place 1 patch onto the skin daily. Remove & Discard patch within 12 hours or as directed by MD 05/25/23   Linwood Dibbles, MD  losartan (  COZAAR) 50 MG tablet Take 100 mg by mouth daily.    [provider]  mirtazapine (REMERON) 30 MG tablet Take 30 mg by mouth at bedtime.    [provider]  nicotine (NICODERM CQ - DOSED IN MG/24 HOURS) 21 mg/24hr patch Place 21 mg onto the skin daily.    [provider]  omeprazole (PRILOSEC OTC) 20 MG tablet Take 20 mg by mouth in the morning and at bedtime.    [provider]  ondansetron (ZOFRAN) 4 MG tablet Take 4 mg by mouth every 6 (six) hours as needed for nausea or vomiting.    [provider]  oxyCODONE-acetaminophen (PERCOCET/ROXICET) 5-325 MG tablet Take 1 tablet by  mouth every 6 (six) hours as needed for severe pain (pain score 7-10). 05/25/23   Linwood Dibbles, MD  polyethylene glycol (MIRALAX / GLYCOLAX) 17 g packet Take 17 g by mouth daily as needed for mild constipation, moderate constipation or severe constipation.    [provider]  predniSONE (DELTASONE) 5 MG tablet Take 5 mg by mouth daily with breakfast.    [provider]  traZODone (DESYREL) 150 MG tablet Take 1 tablet (150 mg total) by mouth at bedtime as needed for sleep. Patient taking differently: Take 150 mg by mouth at bedtime. 05/08/22   Debby Freiberg, NP      Allergies    Patient has no known allergies.    Review of Systems   Review of Systems  Constitutional:  Negative for chills and fever.  HENT:  Negative for ear pain and sore throat.   Eyes:  Negative for pain and visual disturbance.  Respiratory:  Negative for cough and shortness of breath.   Cardiovascular:  Negative for chest pain and palpitations.  Gastrointestinal:  Negative for abdominal pain and vomiting.  Genitourinary:  Negative for dysuria and hematuria.  Musculoskeletal:  Positive for myalgias. Negative for arthralgias and back pain.  Skin:  Negative for color change and rash.  Neurological:  Negative for seizures and syncope.  All other systems reviewed and are negative.   Physical Exam Updated Vital Signs BP (!) 151/87 (BP Location: Right Arm)   Pulse 77   Temp 98 F (36.7 C) (Oral)   Resp 20   SpO2 98%  Physical Exam Vitals and nursing note reviewed.  Constitutional:      General: He is not in acute distress.    Appearance: He is well-developed.  HENT:     Head: Normocephalic and atraumatic.  Eyes:     Conjunctiva/sclera: Conjunctivae normal.  Cardiovascular:     Rate and Rhythm: Normal rate and regular rhythm.     Pulses:          Dorsalis pedis pulses are 2+ on the right side and 2+ on the left side.     Heart sounds: No murmur heard. Pulmonary:     Effort: Pulmonary  effort is normal. No respiratory distress.     Breath sounds: Normal breath sounds.  Abdominal:     Palpations: Abdomen is soft.     Tenderness: There is no abdominal tenderness.  Musculoskeletal:        General: No swelling.     Cervical back: Neck supple. No bony tenderness.     Thoracic back: No bony tenderness.     Lumbar back: No bony tenderness.     Left hip: Normal.     Right upper leg: Tenderness present. No deformity or bony tenderness.     Left  upper leg: Tenderness present. No deformity or bony tenderness.     Right knee: Normal.     Left knee: Normal.     Right lower leg: No edema.     Left lower leg: 1+ Edema present.     Right ankle: Normal.     Left ankle: Normal.  Skin:    General: Skin is warm and dry.     Capillary Refill: Capillary refill takes less than 2 seconds.  Neurological:     Mental Status: He is alert.     Sensory: Sensation is intact.     Motor: Motor function is intact.  Psychiatric:        Mood and Affect: Mood normal.     ED Results / Procedures / Treatments   Labs (all labs ordered are listed, but only abnormal results are displayed) Labs Reviewed - No data to display  EKG None  Radiology No results found.  Procedures Procedures    Medications Ordered in ED Medications  ketorolac (TORADOL) 15 MG/ML injection 15 mg (has no administration in time range)    ED Course/ Medical Decision Making/ A&P                                 Medical Decision Making Amount and/or Complexity of Data Reviewed Radiology: ordered.   82:74 PM 62 year old male with past medical history as presented below presenting for complaints of leg pain and fall.  No head trauma or LOC.  No blood thinner use.  Patient is neurovascular intact.  No spinal tenderness.  Patient is alert and oriented, afebrile, stable vital signs.  Physical exam demonstrates minimal swelling greater on the left as compared to the right.  Recent DVT study demonstrates no DVT.   Patient admits to tenderness to palpation of the lateral proximal thighs.  Possibly some IT band tenderness.  Larger concern for myopathy from statin use.  Lower extremities neurovascular intact.  No fractures on x-ray.  Patient states symptoms have improved and requesting discharge.  Symptoms of chronic muscle pain likely secondary to statin use.  Recommend close follow-up with PCP in the next 3 to 5 days for discussion on change of medication or discontinue use since it has brought him to the emergency department several times now.  Patient in no distress and overall condition improved here in the ED. Detailed discussions were had with the patient regarding current findings, and need for close f/u with PCP or on call doctor. The patient has been instructed to return immediately if the symptoms worsen in any way for re-evaluation. Patient verbalized understanding and is in agreement with current care plan. All questions answered prior to discharge.         Final Clinical Impression(s) / ED Diagnoses Final diagnoses:  Myopathy    Rx / DC Orders ED Discharge Orders     None         Franne Forts, DO 06/12/23 1755

## 2023-06-12 NOTE — Discharge Instructions (Signed)
At this time I have a concern that the patient's pain is secondary to statin use.  This has a well-known side effect of causing muscle pain otherwise known as myopathy.  Please arrange for the patient to have an appointment with his primary care physician for consideration of switching medications or discontinuing use since this is brought him to the emergency department several times in the past month.

## 2023-06-12 NOTE — ED Triage Notes (Signed)
Pt BIB [facility] from [facility] with bilateral leg pain. Per EMS facility found him on the floor; pt reports his legs "gave out". AOx4

## 2023-06-23 ENCOUNTER — Emergency Department (HOSPITAL_COMMUNITY)
Admission: EM | Admit: 2023-06-23 | Discharge: 2023-06-23 | Disposition: A | Discharge status: Home / Self Care | Payer: Medicare (Managed Care)

## 2023-06-23 ENCOUNTER — Other Ambulatory Visit: Payer: Self-pay

## 2023-06-23 ENCOUNTER — Encounter (HOSPITAL_COMMUNITY): Payer: Self-pay

## 2023-06-23 ENCOUNTER — Emergency Department (HOSPITAL_COMMUNITY): Payer: Medicare (Managed Care)

## 2023-06-23 DIAGNOSIS — Z79899 Other long term (current) drug therapy: Secondary | ICD-10-CM | POA: Diagnosis not present

## 2023-06-23 DIAGNOSIS — R1032 Left lower quadrant pain: Secondary | ICD-10-CM | POA: Diagnosis not present

## 2023-06-23 DIAGNOSIS — Z7902 Long term (current) use of antithrombotics/antiplatelets: Secondary | ICD-10-CM | POA: Insufficient documentation

## 2023-06-23 DIAGNOSIS — I10 Essential (primary) hypertension: Secondary | ICD-10-CM | POA: Diagnosis not present

## 2023-06-23 DIAGNOSIS — Z8673 Personal history of transient ischemic attack (TIA), and cerebral infarction without residual deficits: Secondary | ICD-10-CM | POA: Diagnosis not present

## 2023-06-23 DIAGNOSIS — R1031 Right lower quadrant pain: Secondary | ICD-10-CM | POA: Diagnosis present

## 2023-06-23 DIAGNOSIS — R1030 Lower abdominal pain, unspecified: Secondary | ICD-10-CM

## 2023-06-23 LAB — CBC WITH DIFFERENTIAL/PLATELET
Abs Immature Granulocytes: 0.01 10*3/uL (ref 0.00–0.07)
Basophils Absolute: 0 10*3/uL (ref 0.0–0.1)
Basophils Relative: 1 %
Eosinophils Absolute: 0.2 10*3/uL (ref 0.0–0.5)
Eosinophils Relative: 5 %
HCT: 33.1 % — ABNORMAL LOW (ref 39.0–52.0)
Hemoglobin: 10.6 g/dL — ABNORMAL LOW (ref 13.0–17.0)
Immature Granulocytes: 0 %
Lymphocytes Relative: 27 %
Lymphs Abs: 1.1 10*3/uL (ref 0.7–4.0)
MCH: 29.8 pg (ref 26.0–34.0)
MCHC: 32 g/dL (ref 30.0–36.0)
MCV: 93 fL (ref 80.0–100.0)
Monocytes Absolute: 0.3 10*3/uL (ref 0.1–1.0)
Monocytes Relative: 6 %
Neutro Abs: 2.6 10*3/uL (ref 1.7–7.7)
Neutrophils Relative %: 61 %
Platelets: 226 10*3/uL (ref 150–400)
RBC: 3.56 MIL/uL — ABNORMAL LOW (ref 4.22–5.81)
RDW: 14.6 % (ref 11.5–15.5)
WBC: 4.2 10*3/uL (ref 4.0–10.5)
nRBC: 0 % (ref 0.0–0.2)

## 2023-06-23 LAB — URINALYSIS, W/ REFLEX TO CULTURE (INFECTION SUSPECTED)
Bilirubin Urine: NEGATIVE
Glucose, UA: NEGATIVE mg/dL
Hgb urine dipstick: NEGATIVE
Ketones, ur: NEGATIVE mg/dL
Nitrite: NEGATIVE
Protein, ur: NEGATIVE mg/dL
Specific Gravity, Urine: 1.01 (ref 1.005–1.030)
pH: 7 (ref 5.0–8.0)

## 2023-06-23 LAB — COMPREHENSIVE METABOLIC PANEL
ALT: 11 U/L (ref 0–44)
AST: 20 U/L (ref 15–41)
Albumin: 3.8 g/dL (ref 3.5–5.0)
Alkaline Phosphatase: 108 U/L (ref 38–126)
Anion gap: 9 (ref 5–15)
BUN: 20 mg/dL (ref 8–23)
CO2: 26 mmol/L (ref 22–32)
Calcium: 9.6 mg/dL (ref 8.9–10.3)
Chloride: 103 mmol/L (ref 98–111)
Creatinine, Ser: 1.28 mg/dL — ABNORMAL HIGH (ref 0.61–1.24)
GFR, Estimated: >60 mL/min (ref >60)
Glucose, Bld: 100 mg/dL — ABNORMAL HIGH (ref 70–99)
Potassium: 4 mmol/L (ref 3.5–5.1)
Sodium: 138 mmol/L (ref 135–145)
Total Bilirubin: 0.5 mg/dL (ref <1.2)
Total Protein: 6.8 g/dL (ref 6.5–8.1)

## 2023-06-23 MED ORDER — IOHEXOL 350 MG/ML SOLN
75.0000 mL | Freq: Once | INTRAVENOUS | Status: AC | PRN
  Administered 2023-06-23: 75 mL via INTRAVENOUS

## 2023-06-23 MED ORDER — ACETAMINOPHEN 500 MG PO TABS
1000.0000 mg | ORAL_TABLET | Freq: Once | ORAL | Status: AC
  Administered 2023-06-23: 1000 mg via ORAL
  Filled 2023-06-23: qty 2

## 2023-06-23 MED ORDER — MORPHINE SULFATE (PF) 4 MG/ML IV SOLN
4.0000 mg | Freq: Once | INTRAVENOUS | Status: AC
  Administered 2023-06-23: 4 mg via INTRAVENOUS
  Filled 2023-06-23: qty 1

## 2023-06-23 MED ORDER — DICYCLOMINE HCL 10 MG PO CAPS
10.0000 mg | ORAL_CAPSULE | Freq: Once | ORAL | Status: AC
  Administered 2023-06-23: 10 mg via ORAL
  Filled 2023-06-23: qty 1

## 2023-06-23 NOTE — ED Provider Notes (Signed)
[place] EMERGENCY DEPARTMENT AT [facility] Provider Note   CSN: [id number] Arrival date & time: 06/23/23  1059     History  Chief Complaint  Patient presents with   Abdominal Pain    Chad Santos is a 62 y.o. male with past medical history significant for hypertension, stroke, upper GI bleed presents to the ED via EMS from [facility] due to abdominal pain that began around 0700.  Denies nausea, vomiting, diarrhea, fever, blood in stool.  He states his last bowel movement was yesterday and it was normal.        Home Medications Prior to Admission medications   Medication Sig Start Date End Date Taking? Authorizing Provider  abiraterone acetate (ZYTIGA) 250 MG tablet Take 250 mg by mouth in the morning, at noon, in the evening, and at bedtime. Take on an empty stomach 1 hour before or 2 hours after a meal    [provider]  acetaminophen (TYLENOL) 500 MG tablet Take 1,000 mg by mouth every 8 (eight) hours.    [provider]  alum & mag hydroxide-simeth (MAALOX/MYLANTA) 200-200-20 MG/5ML suspension Take 30 mLs by mouth every 4 (four) hours as needed for indigestion or heartburn.    [provider]  amLODipine (NORVASC) 10 MG tablet Take 10 mg by mouth daily.    [provider]  atorvastatin (LIPITOR) 80 MG tablet Take 1 tablet (80 mg total) by mouth at bedtime. 01/09/22   Setzer, Lynnell Jude, PA-C  bacitracin ointment Apply 1 Application topically 2 (two) times daily. 01/09/22   Setzer, Lynnell Jude, PA-C  butalbital-aspirin-caffeine [facility]) [phone] MG capsule Take 1 capsule by mouth every 6 (six) hours as needed for headache.    [provider]  calcium carbonate (TUMS - DOSED IN MG ELEMENTAL CALCIUM) 500 MG chewable tablet Chew 2 tablets by mouth daily.    [provider]  cloNIDine (CATAPRES) 0.1 MG tablet Take 0.1 mg by mouth 2 (two) times daily.    [provider]  clopidogrel (PLAVIX) 75 MG tablet  Take 1 tablet (75 mg total) by mouth daily. 01/09/22   Setzer, Lynnell Jude, PA-C  cyclobenzaprine (FLEXERIL) 5 MG tablet Take 5 mg by mouth every 8 (eight) hours as needed for muscle spasms.    [provider]  DULoxetine (CYMBALTA) 30 MG capsule Take 30 mg by mouth daily.    [provider]  lactulose (CHRONULAC) 10 GM/15ML solution Take 10 g by mouth every 12 (twelve) hours as needed for mild constipation, moderate constipation or severe constipation.    [provider]  lidocaine (LIDODERM) 5 % Place 1 patch onto the skin daily. Remove & Discard patch within 12 hours or as directed by MD 05/25/23   Linwood Dibbles, MD  losartan (COZAAR) 50 MG tablet Take 100 mg by mouth daily.    [provider]  mirtazapine (REMERON) 30 MG tablet Take 30 mg by mouth at bedtime.    [provider]  nicotine (NICODERM CQ - DOSED IN MG/24 HOURS) 21 mg/24hr patch Place 21 mg onto the skin daily.    [provider]  omeprazole (PRILOSEC OTC) 20 MG tablet Take 20 mg by mouth in the morning and at bedtime.    [provider]  ondansetron (ZOFRAN) 4 MG tablet Take 4 mg by mouth every 6 (six) hours as needed for nausea or vomiting.    [provider]  oxyCODONE-acetaminophen (PERCOCET/ROXICET) 5-325 MG tablet Take 1 tablet by mouth every 6 (  six) hours as needed for severe pain (pain score 7-10). 05/25/23   Linwood Dibbles, MD  polyethylene glycol (MIRALAX / GLYCOLAX) 17 g packet Take 17 g by mouth daily as needed for mild constipation, moderate constipation or severe constipation.    [provider]  predniSONE (DELTASONE) 5 MG tablet Take 5 mg by mouth daily with breakfast.    [provider]  traZODone (DESYREL) 150 MG tablet Take 1 tablet (150 mg total) by mouth at bedtime as needed for sleep. Patient taking differently: Take 150 mg by mouth at bedtime. 05/08/22   Debby Freiberg, NP      Allergies    Patient has no known allergies.     Review of Systems   Review of Systems  Constitutional:  Negative for fever.  Gastrointestinal:  Positive for abdominal pain. Negative for blood in stool, constipation, diarrhea, nausea and vomiting.    Physical Exam Updated Vital Signs BP (!) 151/102   Pulse 78   Temp 97.8 F (36.6 C) (Oral)   Resp 15   SpO2 98%  Physical Exam Vitals and nursing note reviewed.  Constitutional:      General: He is not in acute distress.    Appearance: Normal appearance. He is not ill-appearing or diaphoretic.     Comments: Patient sitting up in bed and appears uncomfortable  Cardiovascular:     Rate and Rhythm: Normal rate and regular rhythm.  Pulmonary:     Effort: Pulmonary effort is normal.  Abdominal:     General: Abdomen is flat. Bowel sounds are normal.     Palpations: Abdomen is soft.     Tenderness: There is abdominal tenderness in the right lower quadrant, suprapubic area and left lower quadrant.     Comments: Tenderness across entire lower abdomen, worse in LLQ  Skin:    General: Skin is warm and dry.     Capillary Refill: Capillary refill takes less than 2 seconds.     Comments: Skin is very dry  Neurological:     Mental Status: He is alert. Mental status is at baseline.  Psychiatric:        Mood and Affect: Mood normal.        Behavior: Behavior normal.     ED Results / Procedures / Treatments   Labs (all labs ordered are listed, but only abnormal results are displayed) Labs Reviewed  CBC WITH DIFFERENTIAL/PLATELET - Abnormal; Notable for the following components:      Result Value   RBC 3.56 (*)    Hemoglobin 10.6 (*)    HCT 33.1 (*)    All other components within normal limits  COMPREHENSIVE METABOLIC PANEL - Abnormal; Notable for the following components:   Glucose, Bld 100 (*)    Creatinine, Ser 1.28 (*)    All other components within normal limits  URINALYSIS, W/ REFLEX TO CULTURE (INFECTION SUSPECTED) - Abnormal; Notable for the following components:    APPearance HAZY (*)    Leukocytes,Ua SMALL (*)    Bacteria, UA RARE (*)    All other components within normal limits  URINE CULTURE    EKG EKG Interpretation Date/Time:  Saturday June 23 2023 11:15:13 EST Ventricular Rate:  74 PR Interval:  153 QRS Duration:  90 QT Interval:  398 QTC Calculation: 442 R Axis:   106  Text Interpretation: Sinus rhythm Consider right ventricular hypertrophy Baseline wander in lead(s) V4 Confirmed by Beckey Downing [phone]) on 06/23/2023 11:33:37 AM  Radiology CT ABDOMEN PELVIS W  CONTRAST  Result Date: 06/23/2023 CLINICAL DATA:  Acute onset of left lower quadrant pain this morning. EXAM: CT ABDOMEN AND PELVIS WITH CONTRAST TECHNIQUE: Multidetector CT imaging of the abdomen and pelvis was performed using the standard protocol following bolus administration of intravenous contrast. RADIATION DOSE REDUCTION: This exam was performed according to the departmental dose-optimization program which includes automated exposure control, adjustment of the mA and/or kV according to patient size and/or use of iterative reconstruction technique. CONTRAST:  75mL OMNIPAQUE IOHEXOL 350 MG/ML SOLN COMPARISON:  04/16/2023 FINDINGS: Lower Chest: No acute findings. Hepatobiliary: No suspicious hepatic masses identified. Gallbladder is unremarkable. No evidence of biliary ductal dilatation. Pancreas:  No mass or inflammatory changes. Spleen: Within normal limits in size and appearance. Adrenals/Urinary Tract: No suspicious masses identified. No evidence of ureteral calculi or hydronephrosis. Mild diffuse bladder wall thickening is unchanged, and may be due to chronic bladder outlet obstruction or cystitis. Stomach/Bowel: No evidence of obstruction, inflammatory process or abnormal fluid collections. Vascular/Lymphatic: No pathologically enlarged lymph nodes. No acute vascular findings. Reproductive:  No mass or other significant abnormality. Other:  None. Musculoskeletal: No suspicious  bone lesions identified. Advanced hip osteoarthritis noted bilaterally. Small sebaceous cyst again seen in the right buttock subcutaneous tissues. IMPRESSION: No acute findings. Stable mild diffuse bladder wall thickening, which may be due to chronic bladder outlet obstruction or cystitis. Electronically Signed   By: Danae Orleans M.D.   On: 06/23/2023 14:00    Procedures Procedures    Medications Ordered in ED Medications  dicyclomine (BENTYL) capsule 10 mg (has no administration in time range)  acetaminophen (TYLENOL) tablet 1,000 mg (has no administration in time range)  morphine (PF) 4 MG/ML injection 4 mg (4 mg Intravenous Given 06/23/23 1132)  morphine (PF) 4 MG/ML injection 4 mg (4 mg Intravenous Given 06/23/23 1249)  iohexol (OMNIPAQUE) 350 MG/ML injection 75 mL (75 mLs Intravenous Contrast Given 06/23/23 1338)    ED Course/ Medical Decision Making/ A&P                                 Medical Decision Making  This patient presents to the ED with chief complaint(s) of lower abdominal pain with pertinent past medical history of GI bleed, HTN, stroke.  The complaint involves an extensive differential diagnosis and also carries with it a high risk of complications and morbidity.    The differential diagnosis includes constipation, colitis, diverticulitis, bowel obstruction (less likely as patient is having bowel movements and no nausea or vomiting)   The initial plan is to obtain labs  Additional history obtained: Records reviewed  - patient was seen for similar abdominal pain a couple months back.  Unknown etiology/probable constipation cause at that time.  Initial Assessment:   On exam, patient is sitting upright in bed and appears uncomfortable.  He is holding his lower abdomen.  Abdomen is soft with tenderness across lower abdomen, worse in LLQ.  Normal bowel sounds.  Skin is very dry.    Independent ECG/labs interpretation:  The following labs were independently interpreted:   ECG with sinus rhythm, no obvious ischemia or infarction.  CBC with mild anemia, no leukocytosis.  Metabolic panel with Cr at patient's baseline.  No major electrolyte disturbance. Hepatic function unremarkable.  Independent visualization and interpretation of imaging: I independently visualized the following imaging with scope of interpretation limited to determining acute life threatening conditions related to emergency care: CT abdomen and pelvis, which  revealed stable mild diffuse bladder wall thickening, which may be chronic bladder outlet obstruction or cystitis.    Treatment and Reassessment: Patient given 2 doses of morphine but continues to complain of abdominal pain.  Patient was up out of bed and asking for the nurse in the doorway of his room.  Patient was able to eat and drink without difficulty.    Patient continues to get out of bed asking for pain medication.  He is now complaining of a headache and wanting to use the bathroom.  Will give Tylenol for headache and Bentyl for abdominal discomfort.  Disposition:   Patient's workup is overall reassuring and does not demonstrate an acute or emergent finding to explain patient's symptoms.  Patient's urine was sent for culture.  If this returns positive, may consider treating for cystitis.  Patient does not have any urinary symptoms at this time, so will hold off on antibiotics.  Patient has been able to tolerate eating and drinking without difficulty.  He is stable and appropriate for discharge back to his facility.   The patient has been appropriately medically screened and/or stabilized in the ED. I have low suspicion for any other emergent medical condition which would require further screening, evaluation or treatment in the ED or require inpatient management. At time of discharge the patient is hemodynamically stable and in no acute distress. I have discussed work-up results and diagnosis with patient and answered all questions.  Patient is agreeable with discharge plan. We discussed strict return precautions for returning to the emergency department and they verbalized understanding.             Final Clinical Impression(s) / ED Diagnoses Final diagnoses:  Lower abdominal pain    Rx / DC Orders ED Discharge Orders     None         Lenard Simmer, PA-C 06/23/23 1443    Durwin Glaze, MD 06/23/23 2049

## 2023-06-23 NOTE — ED Notes (Signed)
Ptar called no eta given

## 2023-06-23 NOTE — Discharge Instructions (Addendum)
Thank you for allowing [place] to be a part of your care today.  There was no acute or emergent finding to explain your abdominal pain.  I recommend using MiraLAX as needed for constipation if you experience fewer than 3 bowel movements per week.   Your urine was sent for culture.  If this returns positive and you experience symptoms, a treatment may be prescribed for you.   Follow up with primary care if you continue to have abdominal discomfort.    Return to the ED if you develop sudden worsening of your abdominal pain or if you have new concerns.

## 2023-06-23 NOTE — ED Triage Notes (Signed)
Pt BIB [facility] from [facility]  d/t abd pain that has been going on since 7 am. Pt c/o L lower abd pain. No n/v. No diarrhea. A&O X4. Hx stroke.

## 2023-06-24 LAB — URINE CULTURE

## 2023-06-29 ENCOUNTER — Emergency Department (HOSPITAL_COMMUNITY)
Admission: EM | Admit: 2023-06-29 | Discharge: 2023-06-29 | Disposition: A | Discharge status: Home / Self Care | Payer: Medicare (Managed Care) | Attending: Emergency Medicine | Admitting: Emergency Medicine

## 2023-06-29 ENCOUNTER — Emergency Department (HOSPITAL_COMMUNITY): Payer: Medicare (Managed Care)

## 2023-06-29 ENCOUNTER — Other Ambulatory Visit: Payer: Self-pay

## 2023-06-29 ENCOUNTER — Encounter (HOSPITAL_COMMUNITY): Payer: Self-pay | Admitting: *Deleted

## 2023-06-29 DIAGNOSIS — M25559 Pain in unspecified hip: Secondary | ICD-10-CM

## 2023-06-29 DIAGNOSIS — M25551 Pain in right hip: Secondary | ICD-10-CM | POA: Insufficient documentation

## 2023-06-29 DIAGNOSIS — R079 Chest pain, unspecified: Secondary | ICD-10-CM | POA: Diagnosis present

## 2023-06-29 DIAGNOSIS — N390 Urinary tract infection, site not specified: Secondary | ICD-10-CM | POA: Diagnosis not present

## 2023-06-29 DIAGNOSIS — W19XXXA Unspecified fall, initial encounter: Secondary | ICD-10-CM | POA: Diagnosis not present

## 2023-06-29 DIAGNOSIS — M1990 Unspecified osteoarthritis, unspecified site: Secondary | ICD-10-CM

## 2023-06-29 DIAGNOSIS — M1612 Unilateral primary osteoarthritis, left hip: Secondary | ICD-10-CM | POA: Insufficient documentation

## 2023-06-29 LAB — CBC WITH DIFFERENTIAL/PLATELET
Abs Immature Granulocytes: 0.01 10*3/uL (ref 0.00–0.07)
Basophils Absolute: 0 10*3/uL (ref 0.0–0.1)
Basophils Relative: 1 %
Eosinophils Absolute: 0.3 10*3/uL (ref 0.0–0.5)
Eosinophils Relative: 6 %
HCT: 32.2 % — ABNORMAL LOW (ref 39.0–52.0)
Hemoglobin: 10.6 g/dL — ABNORMAL LOW (ref 13.0–17.0)
Immature Granulocytes: 0 %
Lymphocytes Relative: 32 %
Lymphs Abs: 1.9 10*3/uL (ref 0.7–4.0)
MCH: 31 pg (ref 26.0–34.0)
MCHC: 32.9 g/dL (ref 30.0–36.0)
MCV: 94.2 fL (ref 80.0–100.0)
Monocytes Absolute: 0.5 10*3/uL (ref 0.1–1.0)
Monocytes Relative: 8 %
Neutro Abs: 3.2 10*3/uL (ref 1.7–7.7)
Neutrophils Relative %: 53 %
Platelets: 245 10*3/uL (ref 150–400)
RBC: 3.42 MIL/uL — ABNORMAL LOW (ref 4.22–5.81)
RDW: 14.8 % (ref 11.5–15.5)
WBC: 6 10*3/uL (ref 4.0–10.5)
nRBC: 0 % (ref 0.0–0.2)

## 2023-06-29 LAB — URINALYSIS, ROUTINE W REFLEX MICROSCOPIC
Bilirubin Urine: NEGATIVE
Glucose, UA: NEGATIVE mg/dL
Hgb urine dipstick: NEGATIVE
Ketones, ur: NEGATIVE mg/dL
Nitrite: NEGATIVE
Protein, ur: 30 mg/dL — AB
Specific Gravity, Urine: 1.02 (ref 1.005–1.030)
WBC, UA: >50 WBC/hpf (ref 0–5)
pH: 6 (ref 5.0–8.0)

## 2023-06-29 LAB — COMPREHENSIVE METABOLIC PANEL
ALT: 11 U/L (ref 0–44)
AST: 22 U/L (ref 15–41)
Albumin: 3.5 g/dL (ref 3.5–5.0)
Alkaline Phosphatase: 101 U/L (ref 38–126)
Anion gap: 9 (ref 5–15)
BUN: 22 mg/dL (ref 8–23)
CO2: 27 mmol/L (ref 22–32)
Calcium: 9 mg/dL (ref 8.9–10.3)
Chloride: 105 mmol/L (ref 98–111)
Creatinine, Ser: 1.33 mg/dL — ABNORMAL HIGH (ref 0.61–1.24)
GFR, Estimated: >60 mL/min (ref >60)
Glucose, Bld: 96 mg/dL (ref 70–99)
Potassium: 3.7 mmol/L (ref 3.5–5.1)
Sodium: 141 mmol/L (ref 135–145)
Total Bilirubin: 0.4 mg/dL (ref <1.2)
Total Protein: 6.4 g/dL — ABNORMAL LOW (ref 6.5–8.1)

## 2023-06-29 LAB — TROPONIN I (HIGH SENSITIVITY)
Troponin I (High Sensitivity): 16 ng/L (ref <18)
Troponin I (High Sensitivity): 19 ng/L — ABNORMAL HIGH (ref <18)

## 2023-06-29 LAB — LIPASE, BLOOD: Lipase: 25 U/L (ref 11–51)

## 2023-06-29 MED ORDER — OXYCODONE-ACETAMINOPHEN 5-325 MG PO TABS
1.0000 | ORAL_TABLET | Freq: Once | ORAL | Status: AC
Start: 2023-06-29 — End: 2023-06-29
  Administered 2023-06-29: 1 via ORAL
  Filled 2023-06-29: qty 1

## 2023-06-29 MED ORDER — CEFPODOXIME PROXETIL 200 MG PO TABS: 200.0000 mg | ORAL_TABLET | Freq: Two times a day (BID) | ORAL | 0 refills | Status: AC

## 2023-06-29 NOTE — Discharge Instructions (Signed)
You were seen in the Emergency Department for chest pain and hip pain Your blood work, x-rays and EKG looked okay Your urine may be positive for urinary tract infection For this reason we called in an antibiotic to your pharmacy for you to pick up and begin taking as directed twice daily Follow-up with your primary care doctor in 1 week for reevaluation You will need to follow-up on the results of your urine to see if any bacteria grows in the culture Return to the emergency department for chest pain, trouble breathing, falls or any other concerns Continue to walk with your walker at home

## 2023-06-29 NOTE — ED Notes (Signed)
Pt requesting food. Dr. Elayne Snare notified

## 2023-06-29 NOTE — ED Notes (Signed)
Pt states he would like RN to call his sister, Elonda Husky. Pt verbalized sister can be aware of his medical record and care. RN called [name] and she states she will be at the hospital shortly.

## 2023-06-29 NOTE — ED Notes (Signed)
Pt states unable to urinate right now

## 2023-06-29 NOTE — ED Provider Notes (Signed)
[place] EMERGENCY DEPARTMENT AT [facility] Provider Note   CSN: [id number] Arrival date & time: 06/29/23  1059     History  Chief Complaint  Patient presents with   Chest Pain    Chad Santos is a 62 y.o. male.  With a history of osteoarthritis, stroke and GI bleeding who presents to the ED for chest pain.  He first experienced chest pain around 0900 this morning while at rest.  Pain had persisted for several hours and was unchanged with nitro.  No current chest pain at the time of my evaluation.  Patient also reports fall onto his left side today while attempting to stand up.  No head strike or loss of consciousness.  Now with persistent pain in both hips.  He was able to get up under his own power, bear weight and ambulate after the fall.  Recent discharge in the last 2 days from skilled nursing facility following completion of rehabilitation after stroke.  He now lives alone with help from his sister.  No shortness of breath fevers chills nausea vomiting or abdominal pain at this time.   Chest Pain      Home Medications Prior to Admission medications   Medication Sig Start Date End Date Taking? Authorizing Provider  cefpodoxime (VANTIN) 200 MG tablet Take 1 tablet (200 mg total) by mouth 2 (two) times daily for 10 days. 06/29/23 07/09/23 Yes Royanne Foots, DO  abiraterone acetate (ZYTIGA) 250 MG tablet Take 250 mg by mouth in the morning, at noon, in the evening, and at bedtime. Take on an empty stomach 1 hour before or 2 hours after a meal    [provider]  acetaminophen (TYLENOL) 500 MG tablet Take 1,000 mg by mouth every 8 (eight) hours.    [provider]  alum & mag hydroxide-simeth (MAALOX/MYLANTA) 200-200-20 MG/5ML suspension Take 30 mLs by mouth every 4 (four) hours as needed for indigestion or heartburn.    [provider]  amLODipine (NORVASC) 10 MG tablet Take 10 mg by mouth daily.    [provider]  atorvastatin  (LIPITOR) 80 MG tablet Take 1 tablet (80 mg total) by mouth at bedtime. 01/09/22   Setzer, Lynnell Jude, PA-C  bacitracin ointment Apply 1 Application topically 2 (two) times daily. 01/09/22   Setzer, Lynnell Jude, PA-C  butalbital-aspirin-caffeine [facility]) [phone] MG capsule Take 1 capsule by mouth every 6 (six) hours as needed for headache.    [provider]  calcium carbonate (TUMS - DOSED IN MG ELEMENTAL CALCIUM) 500 MG chewable tablet Chew 2 tablets by mouth daily.    [provider]  cloNIDine (CATAPRES) 0.1 MG tablet Take 0.1 mg by mouth 2 (two) times daily.    [provider]  clopidogrel (PLAVIX) 75 MG tablet Take 1 tablet (75 mg total) by mouth daily. 01/09/22   Setzer, Lynnell Jude, PA-C  cyclobenzaprine (FLEXERIL) 5 MG tablet Take 5 mg by mouth every 8 (eight) hours as needed for muscle spasms.    [provider]  DULoxetine (CYMBALTA) 30 MG capsule Take 30 mg by mouth daily.    [provider]  lactulose (CHRONULAC) 10 GM/15ML solution Take 10 g by mouth every 12 (twelve) hours as needed for mild constipation, moderate constipation or severe constipation.    [provider]  lidocaine (LIDODERM) 5 % Place 1 patch onto the skin daily. Remove & Discard patch within 12 hours or as directed by MD 05/25/23   Linwood Dibbles, MD  losartan (COZAAR) 50 MG tablet Take 100 mg by mouth daily.    [provider]  mirtazapine (REMERON) 30 MG tablet Take 30 mg by mouth at bedtime.    [provider]  nicotine (NICODERM CQ - DOSED IN MG/24 HOURS) 21 mg/24hr patch Place 21 mg onto the skin daily.    [provider]  omeprazole (PRILOSEC OTC) 20 MG tablet Take 20 mg by mouth in the morning and at bedtime.    [provider]  ondansetron (ZOFRAN) 4 MG tablet Take 4 mg by mouth every 6 (six) hours as needed for nausea or vomiting.    [provider]  oxyCODONE-acetaminophen (PERCOCET/ROXICET) 5-325 MG tablet Take 1 tablet by  mouth every 6 (six) hours as needed for severe pain (pain score 7-10). 05/25/23   Linwood Dibbles, MD  polyethylene glycol (MIRALAX / GLYCOLAX) 17 g packet Take 17 g by mouth daily as needed for mild constipation, moderate constipation or severe constipation.    [provider]  predniSONE (DELTASONE) 5 MG tablet Take 5 mg by mouth daily with breakfast.    [provider]  traZODone (DESYREL) 150 MG tablet Take 1 tablet (150 mg total) by mouth at bedtime as needed for sleep. Patient taking differently: Take 150 mg by mouth at bedtime. 05/08/22   Debby Freiberg, NP      Allergies    Patient has no known allergies.    Review of Systems   Review of Systems  Cardiovascular:  Positive for chest pain.    Physical Exam Updated Vital Signs BP (!) 162/90   Pulse 71   Temp 98.2 F (36.8 C) (Oral)   Resp 14   Ht 6\' 2"  (1.88 m)   Wt 87.5 kg   SpO2 100%   BMI 24.78 kg/m  Physical Exam Vitals and nursing note reviewed.  HENT:     Head: Normocephalic and atraumatic.  Eyes:     Pupils: Pupils are equal, round, and reactive to light.  Cardiovascular:     Rate and Rhythm: Normal rate and regular rhythm.  Pulmonary:     Effort: Pulmonary effort is normal.     Breath sounds: Normal breath sounds.  Abdominal:     Palpations: Abdomen is soft.     Tenderness: There is no abdominal tenderness.  Musculoskeletal:     Comments: Tenderness to palpation over anterolateral left and right hips Full active range of motion with lection extension at the hips and knees with 5 out of 5 strength throughout the lower extremities No cervical tenderness No midline tenderness step-off deformities of the back  Skin:    General: Skin is warm and dry.  Neurological:     Mental Status: He is alert.  Psychiatric:        Mood and Affect: Mood normal.     ED Results / Procedures / Treatments   Labs (all labs ordered are listed, but only abnormal results are displayed) Labs Reviewed   COMPREHENSIVE METABOLIC PANEL - Abnormal; Notable for the following components:      Result Value   Creatinine, Ser 1.33 (*)    Total Protein 6.4 (*)    All other components within normal limits  CBC WITH DIFFERENTIAL/PLATELET - Abnormal; Notable for the following components:   RBC 3.42 (*)    Hemoglobin 10.6 (*)    HCT 32.2 (*)    All other components within normal limits  URINALYSIS, ROUTINE W REFLEX MICROSCOPIC - Abnormal; Notable for the following components:  Color, Urine AMBER (*)    APPearance CLOUDY (*)    Protein, ur 30 (*)    Leukocytes,Ua LARGE (*)    Bacteria, UA MANY (*)    All other components within normal limits  TROPONIN I (HIGH SENSITIVITY) - Abnormal; Notable for the following components:   Troponin I (High Sensitivity) 19 (*)    All other components within normal limits  LIPASE, BLOOD  TROPONIN I (HIGH SENSITIVITY)    EKG EKG Interpretation Date/Time:  Friday June 29 2023 10:58:45 EST Ventricular Rate:  77 PR Interval:  136 QRS Duration:  84 QT Interval:  392 QTC Calculation: 443 R Axis:   87  Text Interpretation: Normal sinus rhythm Cannot rule out Anterior infarct , age undetermined ST-t wave abnormality Artifact Abnormal ECG Confirmed by Gerhard Munch [phone]) on 06/29/2023 11:25:47 AM  Radiology DG Hip Unilat With Pelvis 2-3 Views Left  Result Date: 06/29/2023 CLINICAL DATA:  Left hip pain. EXAM: DG HIP (WITH OR WITHOUT PELVIS) 2-3V LEFT COMPARISON:  June 12, 2023. FINDINGS: There is no evidence of hip fracture or dislocation. Severe narrowing is seen involving the superior portion of left hip joint with adjacent subchondral cyst formation and sclerosis. IMPRESSION: Severe osteoarthritis of left hip.  No acute abnormality seen. Electronically Signed   By: Lupita Raider M.D.   On: 06/29/2023 12:57   DG Chest 2 View  Result Date: 06/29/2023 CLINICAL DATA:  Chest pain. EXAM: CHEST - 2 VIEW COMPARISON:  June 20, 2022. FINDINGS: The heart  size and mediastinal contours are within normal limits. Both lungs are clear. The visualized skeletal structures are unremarkable. IMPRESSION: No active cardiopulmonary disease. Electronically Signed   By: Lupita Raider M.D.   On: 06/29/2023 12:55    Procedures Procedures    Medications Ordered in ED Medications  oxyCODONE-acetaminophen (PERCOCET/ROXICET) 5-325 MG per tablet 1 tablet (1 tablet Oral Given 06/29/23 1534)    ED Course/ Medical Decision Making/ A&P Clinical Course as of 06/29/23 1646  Fri Jun 29, 2023  1637 Delta troponin flat not consistent with ACS.  Renal function at baseline.  No significant leukocytosis or anemia.  UA suggestive of urinary tract infection.  Will give him his first dose of antibiotics here and prescribe short outpatient course for the treatment of UTI.  Will send for culture.  Patient remained stable he is upright and eating and able to ambulate with his walker.  Stable for discharge home with plan for close PCP follow-up.  Return precautions discussed in detail [MP]    Clinical Course User Index [MP] Royanne Foots, DO                                 Medical Decision Making 62 year old male with history as above recently discharged from skilled nursing facility after completing rehabilitation following CVA.  Here with 2 complaints.  Bilateral hip pain after a fall as well as chest pain.  Chest pain began this morning at rest and nitro was given prior to arrival.  No active chest pain shortness of breath or other symptoms that would be consistent with ACS at this time.  Pain in both hips after a fall.  No head strike.  Triage orders placed.  Laboratory workup unremarkable.  High-sensitivity troponin of 12 will need to be repeated to obtain delta to evaluate for ACS.  Some osteoarthritis on pelvic x-rays but no acute fracture or dislocation.  Chest  x-ray shows no acute disease.  Remainder of his laboratory workup is unremarkable overall with renal function at  baseline.  On my initial assessment patient is afebrile and slightly hypertensive.  Is requesting medication for his hip pain as well as to eat.  As long as he remains stable, delta troponin is flat and he is able to bear weight and ambulate with his walker we will likely be able to discharge him at home with the instructions for close PCP follow-up  Amount and/or Complexity of Data Reviewed Labs: ordered. Radiology: ordered.  Risk Prescription drug management.           Final Clinical Impression(s) / ED Diagnoses Final diagnoses:  Chest pain, unspecified type  Hip pain, unspecified laterality  Osteoarthritis, unspecified osteoarthritis type, unspecified site  Urinary tract infection with hematuria, site unspecified    Rx / DC Orders ED Discharge Orders          Ordered    cefpodoxime (VANTIN) 200 MG tablet  2 times daily        06/29/23 1645              Estelle June A, DO 06/29/23 1647

## 2023-06-29 NOTE — ED Notes (Signed)
Pt has personal walker/ labeled with him in EMS triage 14

## 2023-06-29 NOTE — ED Provider Triage Note (Signed)
Emergency Medicine Provider Triage Evaluation Note  Chad Santos , a 62 y.o. male  was evaluated in triage.  Pt complains of pain in the left axilla from the lower portion to the left hip.  Patient arrives via EMS.  History is also obtained by those individuals..  Patient had evaluation last week for pain in the left side, had an additional fall today.  EMS reports provision of aspirin, nitroglycerin, no change in his pain.  Vital signs unremarkable  Review of Systems  Positive: Left axillary pain Negative: Urinary complaints, other abdominal pain, nausea, vomiting, fever  Physical Exam  BP (!) 142/90 (BP Location: Right Arm)   Pulse 79   Temp 98.3 F (36.8 C) (Oral)   Resp 18   Wt 87.5 kg   SpO2 99%   BMI 25.46 kg/m  Gen:   Awake, no distress Resp:  Normal effort no increased work of breathing MSK:   Moves extremities without difficulty no deformity Other:  Neuro: Evidence of prior stroke  Medical Decision Making  Medically screening exam initiated at 11:29 AM.  Appropriate orders placed.  Pericles Skilling was informed that the remainder of the evaluation will be completed by another provider, this initial triage assessment does not replace that evaluation, and the importance of remaining in the ED until their evaluation is complete.   Gerhard Munch, MD 06/29/23 1131

## 2023-06-29 NOTE — ED Notes (Signed)
Pt states pain medication isn't working. Dr. Elayne Snare aware

## 2023-06-29 NOTE — ED Notes (Signed)
Pt requested water in lobby. This tech asked PA in triage and she stated he could have some ice chips. Pt provided with 1/2 cup of ice.

## 2023-06-29 NOTE — ED Triage Notes (Signed)
BIB [facility] from home for CP, onset 0430, dull and intermittent. Also R hip pain s/p fall this am. H/o CVA with defecits. Difficult to understand. EMS gave ASA and ntg 0.4 x1 w/o change. NSL in place, 20g L AC. VSS. NSR. CBG 128. Recently home from SNF/rehab s/p CVA. Mentions sob, LS CTA. Alert, NAD, calm, interactive, resps e/u.

## 2023-07-05 ENCOUNTER — Ambulatory Visit
Admission: RE | Admit: 2023-07-05 | Discharge: 2023-07-05 | Disposition: A | Discharge status: Home / Self Care | Payer: Medicare (Managed Care) | Source: Ambulatory Visit | Attending: Vascular Surgery | Admitting: Vascular Surgery

## 2023-07-05 ENCOUNTER — Other Ambulatory Visit: Payer: Self-pay | Admitting: Vascular Surgery

## 2023-07-05 DIAGNOSIS — S8992XA Unspecified injury of left lower leg, initial encounter: Secondary | ICD-10-CM

## 2023-07-09 ENCOUNTER — Other Ambulatory Visit (HOSPITAL_COMMUNITY): Payer: Self-pay | Admitting: Vascular Surgery

## 2023-07-09 DIAGNOSIS — M25551 Pain in right hip: Secondary | ICD-10-CM

## 2023-07-10 ENCOUNTER — Encounter: Payer: Self-pay | Admitting: Vascular Surgery

## 2023-07-12 ENCOUNTER — Emergency Department (HOSPITAL_COMMUNITY): Payer: Medicare (Managed Care)

## 2023-07-12 ENCOUNTER — Emergency Department (HOSPITAL_COMMUNITY)
Admission: EM | Admit: 2023-07-12 | Discharge: 2023-07-16 | Disposition: A | Discharge status: Skilled Nursing Facility | Payer: Medicare (Managed Care) | Attending: Emergency Medicine | Admitting: Emergency Medicine

## 2023-07-12 ENCOUNTER — Other Ambulatory Visit: Payer: Self-pay

## 2023-07-12 ENCOUNTER — Encounter (HOSPITAL_COMMUNITY): Payer: Self-pay

## 2023-07-12 DIAGNOSIS — R2681 Unsteadiness on feet: Secondary | ICD-10-CM | POA: Diagnosis not present

## 2023-07-12 DIAGNOSIS — Z7901 Long term (current) use of anticoagulants: Secondary | ICD-10-CM | POA: Insufficient documentation

## 2023-07-12 DIAGNOSIS — R000 Tachycardia, unspecified: Secondary | ICD-10-CM | POA: Diagnosis not present

## 2023-07-12 DIAGNOSIS — I700 Atherosclerosis of aorta: Secondary | ICD-10-CM | POA: Diagnosis not present

## 2023-07-12 DIAGNOSIS — I10 Essential (primary) hypertension: Secondary | ICD-10-CM | POA: Insufficient documentation

## 2023-07-12 DIAGNOSIS — M25551 Pain in right hip: Secondary | ICD-10-CM | POA: Insufficient documentation

## 2023-07-12 DIAGNOSIS — R627 Adult failure to thrive: Secondary | ICD-10-CM | POA: Insufficient documentation

## 2023-07-12 DIAGNOSIS — M79652 Pain in left thigh: Secondary | ICD-10-CM | POA: Diagnosis not present

## 2023-07-12 DIAGNOSIS — I119 Hypertensive heart disease without heart failure: Secondary | ICD-10-CM | POA: Diagnosis not present

## 2023-07-12 DIAGNOSIS — M25552 Pain in left hip: Secondary | ICD-10-CM | POA: Diagnosis not present

## 2023-07-12 DIAGNOSIS — R4182 Altered mental status, unspecified: Secondary | ICD-10-CM | POA: Diagnosis not present

## 2023-07-12 DIAGNOSIS — Z8673 Personal history of transient ischemic attack (TIA), and cerebral infarction without residual deficits: Secondary | ICD-10-CM | POA: Insufficient documentation

## 2023-07-12 DIAGNOSIS — Z9181 History of falling: Secondary | ICD-10-CM | POA: Insufficient documentation

## 2023-07-12 DIAGNOSIS — R531 Weakness: Secondary | ICD-10-CM | POA: Insufficient documentation

## 2023-07-12 DIAGNOSIS — M6281 Muscle weakness (generalized): Secondary | ICD-10-CM | POA: Insufficient documentation

## 2023-07-12 DIAGNOSIS — R2689 Other abnormalities of gait and mobility: Secondary | ICD-10-CM | POA: Insufficient documentation

## 2023-07-12 DIAGNOSIS — Z7902 Long term (current) use of antithrombotics/antiplatelets: Secondary | ICD-10-CM | POA: Insufficient documentation

## 2023-07-12 DIAGNOSIS — Z79899 Other long term (current) drug therapy: Secondary | ICD-10-CM | POA: Insufficient documentation

## 2023-07-12 DIAGNOSIS — Z1152 Encounter for screening for COVID-19: Secondary | ICD-10-CM | POA: Insufficient documentation

## 2023-07-12 LAB — COMPREHENSIVE METABOLIC PANEL
ALT: 18 U/L (ref 0–44)
AST: 23 U/L (ref 15–41)
Albumin: 3.2 g/dL — ABNORMAL LOW (ref 3.5–5.0)
Alkaline Phosphatase: 106 U/L (ref 38–126)
Anion gap: 8 (ref 5–15)
BUN: 28 mg/dL — ABNORMAL HIGH (ref 8–23)
CO2: 29 mmol/L (ref 22–32)
Calcium: 9.1 mg/dL (ref 8.9–10.3)
Chloride: 107 mmol/L (ref 98–111)
Creatinine, Ser: 1.43 mg/dL — ABNORMAL HIGH (ref 0.61–1.24)
GFR, Estimated: 55 mL/min — ABNORMAL LOW (ref >60)
Glucose, Bld: 102 mg/dL — ABNORMAL HIGH (ref 70–99)
Potassium: 4.2 mmol/L (ref 3.5–5.1)
Sodium: 144 mmol/L (ref 135–145)
Total Bilirubin: 0.4 mg/dL (ref <1.2)
Total Protein: 6.1 g/dL — ABNORMAL LOW (ref 6.5–8.1)

## 2023-07-12 LAB — CBG MONITORING, ED: Glucose-Capillary: 82 mg/dL (ref 70–99)

## 2023-07-12 LAB — URINALYSIS, ROUTINE W REFLEX MICROSCOPIC
Bilirubin Urine: NEGATIVE
Glucose, UA: NEGATIVE mg/dL
Ketones, ur: NEGATIVE mg/dL
Nitrite: NEGATIVE
Protein, ur: 30 mg/dL — AB
Specific Gravity, Urine: 1.019 (ref 1.005–1.030)
WBC, UA: >50 WBC/hpf (ref 0–5)
pH: 7 (ref 5.0–8.0)

## 2023-07-12 LAB — RESP PANEL BY RT-PCR (RSV, FLU A&B, COVID)  RVPGX2
Influenza A by PCR: NEGATIVE
Influenza B by PCR: NEGATIVE
Resp Syncytial Virus by PCR: NEGATIVE
SARS Coronavirus 2 by RT PCR: NEGATIVE

## 2023-07-12 LAB — CBC WITH DIFFERENTIAL/PLATELET
Abs Immature Granulocytes: 0.01 10*3/uL (ref 0.00–0.07)
Basophils Absolute: 0 10*3/uL (ref 0.0–0.1)
Basophils Relative: 1 %
Eosinophils Absolute: 0.4 10*3/uL (ref 0.0–0.5)
Eosinophils Relative: 6 %
HCT: 31.1 % — ABNORMAL LOW (ref 39.0–52.0)
Hemoglobin: 9.8 g/dL — ABNORMAL LOW (ref 13.0–17.0)
Immature Granulocytes: 0 %
Lymphocytes Relative: 39 %
Lymphs Abs: 2.5 10*3/uL (ref 0.7–4.0)
MCH: 30 pg (ref 26.0–34.0)
MCHC: 31.5 g/dL (ref 30.0–36.0)
MCV: 95.1 fL (ref 80.0–100.0)
Monocytes Absolute: 0.6 10*3/uL (ref 0.1–1.0)
Monocytes Relative: 9 %
Neutro Abs: 2.9 10*3/uL (ref 1.7–7.7)
Neutrophils Relative %: 45 %
Platelets: 247 10*3/uL (ref 150–400)
RBC: 3.27 MIL/uL — ABNORMAL LOW (ref 4.22–5.81)
RDW: 14 % (ref 11.5–15.5)
WBC: 6.4 10*3/uL (ref 4.0–10.5)
nRBC: 0 % (ref 0.0–0.2)

## 2023-07-12 LAB — CK: Total CK: 175 U/L (ref 49–397)

## 2023-07-12 LAB — LACTIC ACID, PLASMA
Lactic Acid, Venous: 1.1 mmol/L (ref 0.5–1.9)
Lactic Acid, Venous: 1.1 mmol/L (ref 0.5–1.9)

## 2023-07-12 MED ORDER — ALUM & MAG HYDROXIDE-SIMETH 200-200-20 MG/5ML PO SUSP: 30.0000 mL | ORAL | Status: DC | PRN

## 2023-07-12 MED ORDER — SODIUM CHLORIDE 0.9 % IV BOLUS
500.0000 mL | Freq: Once | INTRAVENOUS | Status: AC
  Administered 2023-07-12: 500 mL via INTRAVENOUS

## 2023-07-12 MED ORDER — OXYCODONE HCL 5 MG PO TABS
5.0000 mg | ORAL_TABLET | Freq: Once | ORAL | Status: AC
  Administered 2023-07-12: 5 mg via ORAL
  Filled 2023-07-12: qty 1

## 2023-07-12 MED ORDER — ACETAMINOPHEN 500 MG PO TABS
1000.0000 mg | ORAL_TABLET | Freq: Three times a day (TID) | ORAL | Status: DC
  Administered 2023-07-12 – 2023-07-16 (×10): 1000 mg via ORAL
  Filled 2023-07-12 (×11): qty 2

## 2023-07-12 MED ORDER — CLOPIDOGREL BISULFATE 75 MG PO TABS
75.0000 mg | ORAL_TABLET | Freq: Every day | ORAL | Status: DC
  Administered 2023-07-12 – 2023-07-16 (×5): 75 mg via ORAL
  Filled 2023-07-12 (×5): qty 1

## 2023-07-12 MED ORDER — CYCLOBENZAPRINE HCL 10 MG PO TABS
5.0000 mg | ORAL_TABLET | Freq: Three times a day (TID) | ORAL | Status: DC | PRN
  Administered 2023-07-14: 5 mg via ORAL
  Filled 2023-07-12: qty 1

## 2023-07-12 MED ORDER — ABIRATERONE ACETATE 250 MG PO TABS: 250.0000 mg | ORAL_TABLET | Freq: Every day | ORAL | Status: DC

## 2023-07-12 MED ORDER — ATORVASTATIN CALCIUM 80 MG PO TABS
80.0000 mg | ORAL_TABLET | Freq: Every day | ORAL | Status: DC
  Administered 2023-07-12 – 2023-07-15 (×4): 80 mg via ORAL
  Filled 2023-07-12: qty 2
  Filled 2023-07-12 (×3): qty 1

## 2023-07-12 MED ORDER — TRAZODONE HCL 50 MG PO TABS
150.0000 mg | ORAL_TABLET | Freq: Every evening | ORAL | Status: DC | PRN
  Administered 2023-07-12 – 2023-07-14 (×3): 150 mg via ORAL
  Filled 2023-07-12 (×3): qty 3

## 2023-07-12 MED ORDER — LORAZEPAM 1 MG PO TABS
1.0000 mg | ORAL_TABLET | Freq: Once | ORAL | Status: AC
  Administered 2023-07-12: 1 mg via ORAL
  Filled 2023-07-12: qty 1

## 2023-07-12 MED ORDER — DULOXETINE HCL 30 MG PO CPEP
30.0000 mg | ORAL_CAPSULE | Freq: Every day | ORAL | Status: DC
  Administered 2023-07-12 – 2023-07-16 (×5): 30 mg via ORAL
  Filled 2023-07-12 (×5): qty 1

## 2023-07-12 MED ORDER — MIRTAZAPINE 15 MG PO TABS
30.0000 mg | ORAL_TABLET | Freq: Every day | ORAL | Status: DC
  Administered 2023-07-12 – 2023-07-15 (×4): 30 mg via ORAL
  Filled 2023-07-12 (×4): qty 2

## 2023-07-12 MED ORDER — AMLODIPINE BESYLATE 5 MG PO TABS
10.0000 mg | ORAL_TABLET | Freq: Every day | ORAL | Status: DC
  Administered 2023-07-12 – 2023-07-16 (×5): 10 mg via ORAL
  Filled 2023-07-12 (×5): qty 2

## 2023-07-12 MED ORDER — CLONIDINE HCL 0.1 MG PO TABS
0.1000 mg | ORAL_TABLET | Freq: Two times a day (BID) | ORAL | Status: DC
  Administered 2023-07-12 – 2023-07-16 (×8): 0.1 mg via ORAL
  Filled 2023-07-12 (×8): qty 1

## 2023-07-12 NOTE — ED Triage Notes (Signed)
Per EMS and pt report, pt's family became concerned because they hadn't heard from him in 2-3 days. The landlord did a wellness check and the pt was found in bed covered in urine. Pt is not eating, taking meds, or caring for himself. The family has no idea for how long. Pt states he can't walk. Had a stroke 12/2022 and was using a walker. Pt was able to stand and pivot to EMS stretcher.

## 2023-07-12 NOTE — ED Notes (Signed)
Pt resting, no complaints at this time. Back from [facility]

## 2023-07-12 NOTE — ED Provider Notes (Signed)
[place] EMERGENCY DEPARTMENT AT [facility] Provider Note  CSN: [id number] Arrival date & time: 07/12/23 1411  Chief Complaint(s) Altered Mental Status  HPI Chad Santos is a 62 y.o. male This is a 62 year old male here today because his family was concerned that they had not heard from him in 2 to 3 days.  Landlord did a wellness check and found the patient in bed covered in urine.  He was not eating, taking his meds or caring for himself.  Patient's nurse practitioner called me and told me that she had been concerned about avascular necrosis of the patient's hip.   Past Medical History Past Medical History:  Diagnosis Date   Hip osteoarthritis 07/10/2020   Hypertension    [facility]roke [facility])    Patient Active Problem List   Diagnosis Date Noted   Left-sided weakness 01/31/2022   Depression 01/23/2022   Vitamin D deficiency 01/23/2022   Hypotension 01/23/2022   Acute ischemic stroke (HCC) 01/01/2022   Acute CVA (cerebrovascular accident) (HCC) 12/28/2021   Upper GI bleeding 08/21/2021   Elevated troponin 08/21/2021   Bilateral leg pain 08/21/2021   Agitation 08/21/2021   Blood in the stool    Normocytic anemia    Home Medication(s) Prior to Admission medications   Medication Sig Start Date End Date Taking? Authorizing Provider  abiraterone acetate (ZYTIGA) 250 MG tablet Take 250 mg by mouth in the morning, at noon, in the evening, and at bedtime. Take on an empty stomach 1 hour before or 2 hours after a meal    [provider]  acetaminophen (TYLENOL) 500 MG tablet Take 1,000 mg by mouth every 8 (eight) hours.    [provider]  alum & mag hydroxide-simeth (MAALOX/MYLANTA) 200-200-20 MG/5ML suspension Take 30 mLs by mouth every 4 (four) hours as needed for indigestion or heartburn.    [provider]  amLODipine (NORVASC) 10 MG tablet Take 10 mg by mouth daily.    [provider]  atorvastatin (LIPITOR) 80 MG tablet Take 1  tablet (80 mg total) by mouth at bedtime. 01/09/22   Setzer, Lynnell Jude, PA-C  bacitracin ointment Apply 1 Application topically 2 (two) times daily. 01/09/22   Setzer, Lynnell Jude, PA-C  butalbital-aspirin-caffeine [facility]) [phone] MG capsule Take 1 capsule by mouth every 6 (six) hours as needed for headache.    [provider]  calcium carbonate (TUMS - DOSED IN MG ELEMENTAL CALCIUM) 500 MG chewable tablet Chew 2 tablets by mouth daily.    [provider]  cloNIDine (CATAPRES) 0.1 MG tablet Take 0.1 mg by mouth 2 (two) times daily.    [provider]  clopidogrel (PLAVIX) 75 MG tablet Take 1 tablet (75 mg total) by mouth daily. 01/09/22   Setzer, Lynnell Jude, PA-C  cyclobenzaprine (FLEXERIL) 5 MG tablet Take 5 mg by mouth every 8 (eight) hours as needed for muscle spasms.    [provider]  DULoxetine (CYMBALTA) 30 MG capsule Take 30 mg by mouth daily.    [provider]  lactulose (CHRONULAC) 10 GM/15ML solution Take 10 g by mouth every 12 (twelve) hours as needed for mild constipation, moderate constipation or severe constipation.    [provider]  lidocaine (LIDODERM) 5 % Place 1 patch onto the skin daily. Remove & Discard patch within 12 hours or as directed by MD 05/25/23   Linwood Dibbles, MD  losartan (COZAAR) 50 MG tablet Take 100 mg by mouth daily.    [provider]  mirtazapine (REMERON) 30 MG tablet Take 30 mg by mouth at bedtime.    [provider]  nicotine (NICODERM CQ - DOSED IN MG/24 HOURS) 21 mg/24hr patch Place 21 mg onto the skin daily.    [provider]  omeprazole (PRILOSEC OTC) 20 MG tablet Take 20 mg by mouth in the morning and at bedtime.    [provider]  ondansetron (ZOFRAN) 4 MG tablet Take 4 mg by mouth every 6 (six) hours as needed for nausea or vomiting.    [provider]  oxyCODONE-acetaminophen (PERCOCET/ROXICET) 5-325 MG tablet Take 1 tablet by mouth every 6 (six) hours as  needed for severe pain (pain score 7-10). 05/25/23   Linwood Dibbles, MD  polyethylene glycol (MIRALAX / GLYCOLAX) 17 g packet Take 17 g by mouth daily as needed for mild constipation, moderate constipation or severe constipation.    [provider]  predniSONE (DELTASONE) 5 MG tablet Take 5 mg by mouth daily with breakfast.    [provider]  traZODone (DESYREL) 150 MG tablet Take 1 tablet (150 mg total) by mouth at bedtime as needed for sleep. Patient taking differently: Take 150 mg by mouth at bedtime. 05/08/22   Debby Freiberg, NP                                                                                                                                    Past Surgical History Past Surgical History:  Procedure Laterality Date   BIOPSY  08/23/2021   Procedure: BIOPSY;  Surgeon: Imogene Burn, MD;  Location: [facility] ENDOSCOPY;  Service: Gastroenterology;;   COLONOSCOPY WITH PROPOFOL N/A 08/23/2021   Procedure: COLONOSCOPY WITH PROPOFOL;  Surgeon: Imogene Burn, MD;  Location: [facility] ENDOSCOPY;  Service: Gastroenterology;  Laterality: N/A;   ESOPHAGOGASTRODUODENOSCOPY (EGD) WITH PROPOFOL N/A 08/23/2021   Procedure: ESOPHAGOGASTRODUODENOSCOPY (EGD) WITH PROPOFOL;  Surgeon: Imogene Burn, MD;  Location[facility] ENDOSCOPY;  Service: Gastroenterology;  Laterality: N/A;   HAND SURGERY Right    POLYPECTOMY  08/23/2021   Procedure: POLYPECTOMY;  Surgeon: Imogene Burn, MD;  Location: [facility] ENDOSCOPY;  Service: Gastroenterology;;   Family History Family History  Problem Relation Age of Onset   Colon cancer Neg Hx    Esophageal cancer Neg Hx    Rectal cancer Neg Hx    Stomach cancer Neg Hx     Social History Social History   Tobacco Use   Smoking status: Former    Current packs/day: 1.00    Average packs/day: 1 pack/day for 37.0 years (37.0 ttl pk-yrs)    Types: Cigarettes   Smokeless tobacco: Never   Tobacco comments:    Reports <1ppd  Vaping Use   Vaping status: Never Used   Substance Use Topics   Alcohol use: Not Currently    Comment: occasionally,6 pack/month   Drug use: Never   Allergies Penicillins  Review of Systems Review of Systems  Physical Exam Vital Signs  I have reviewed the triage vital signs BP (!) 162/79   Pulse 90   Temp 98.2 F (36.8 C)   Resp 16   Ht 6\' 2"  (1.88 m)   Wt 87.5 kg   SpO2 99%   BMI 24.77 kg/m   Physical Exam Vitals reviewed.  HENT:     Head: Normocephalic and atraumatic.  Cardiovascular:     Rate and Rhythm: Normal rate.     Pulses: Normal pulses.  Pulmonary:     Effort: Pulmonary effort is normal.     Breath sounds: Normal breath sounds.  Abdominal:     General: Abdomen is flat.     Palpations: Abdomen is soft.  Musculoskeletal:        General: Tenderness present. Normal range of motion.     Comments: Right hip tenderness  Neurological:     Mental Status: He is alert.     ED Results and Treatments Labs (all labs ordered are listed, but only abnormal results are displayed) Labs Reviewed  CBC WITH DIFFERENTIAL/PLATELET - Abnormal; Notable for the following components:      Result Value   RBC 3.27 (*)    Hemoglobin 9.8 (*)    HCT 31.1 (*)    All other components within normal limits  COMPREHENSIVE METABOLIC PANEL - Abnormal; Notable for the following components:   Glucose, Bld 102 (*)    BUN 28 (*)    Creatinine, Ser 1.43 (*)    Total Protein 6.1 (*)    Albumin 3.2 (*)    GFR, Estimated 55 (*)    All other components within normal limits  URINALYSIS, ROUTINE W REFLEX MICROSCOPIC - Abnormal; Notable for the following components:   APPearance CLOUDY (*)    Hgb urine dipstick SMALL (*)    Protein, ur 30 (*)    Leukocytes,Ua LARGE (*)    Bacteria, UA RARE (*)    All other components within normal limits  RESP PANEL BY RT-PCR (RSV, FLU A&B, COVID)  RVPGX2  CULTURE, BLOOD (ROUTINE X 2)  CULTURE, BLOOD (ROUTINE X 2)  CK  LACTIC ACID, PLASMA  LACTIC ACID, PLASMA  CBG MONITORING, ED                                                                                                                           Radiology DG Pelvis 1-2 Views Result Date: 07/12/2023 CLINICAL DATA:  Pain. EXAM: Pelvis 1 view COMPARISON:  None Available. FINDINGS: There is left more severe than right bilateral hip degenerative change with joint space narrowing and small osteophytes. Pelvic ring is intact. No acute fracture, dislocation or subluxation. No osteolytic or osteoblastic lesions. IMPRESSION: Bilateral hip degenerative changes.  No acute osseous abnormalities. Electronically Signed   By: Layla Maw M.D.   On: 07/12/2023 19:46   DG Chest Portable 1 View Result Date: 07/12/2023 CLINICAL DATA:  dyspnea EXAM: PORTABLE CHEST - 1 VIEW COMPARISON:  06/29/2023. FINDINGS: Cardiac  silhouette is unremarkable. No pneumothorax or pleural effusion. The lungs are clear. Aorta is calcified. The visualized skeletal structures are unremarkable. IMPRESSION: No acute cardiopulmonary process. Electronically Signed   By: Layla Maw M.D.   On: 07/12/2023 19:09   CT Head Wo Contrast Result Date: 07/12/2023 CLINICAL DATA:  Delirium EXAM: CT HEAD WITHOUT CONTRAST TECHNIQUE: Contiguous axial images were obtained from the base of the skull through the vertex without intravenous contrast. RADIATION DOSE REDUCTION: This exam was performed according to the departmental dose-optimization program which includes automated exposure control, adjustment of the mA and/or kV according to patient size and/or use of iterative reconstruction technique. COMPARISON:  06/20/2022 FINDINGS: Brain: No evidence of acute infarction, hemorrhage, mass, mass effect, or midline shift. No hydrocephalus or extra-axial fluid collection. Advanced cerebral volume loss for age. Periventricular white matter changes, likely the sequela of chronic small vessel ischemic disease. Remote lacunar infarcts in the basal ganglia and left cerebellum. Vascular: No  hyperdense vessel. Skull: Negative for fracture or focal lesion. Sinuses/Orbits: Mucosal thickening in the ethmoid air cells. No acute finding in the imaged orbits. Other: The mastoid air cells are well aerated. IMPRESSION: No acute intracranial process. Electronically Signed   By: Wiliam Ke M.D.   On: 07/12/2023 17:27    Pertinent labs & imaging results that were available during my care of the patient were reviewed by me and considered in my medical decision making (see MDM for details).  Medications Ordered in ED Medications  abiraterone acetate (ZYTIGA) tablet 250 mg (has no administration in time range)  acetaminophen (TYLENOL) tablet 1,000 mg (has no administration in time range)  alum & mag hydroxide-simeth (MAALOX/MYLANTA) 200-200-20 MG/5ML suspension 30 mL (has no administration in time range)  amLODipine (NORVASC) tablet 10 mg (has no administration in time range)  atorvastatin (LIPITOR) tablet 80 mg (has no administration in time range)  cloNIDine (CATAPRES) tablet 0.1 mg (has no administration in time range)  clopidogrel (PLAVIX) tablet 75 mg (has no administration in time range)  cyclobenzaprine (FLEXERIL) tablet 5 mg (has no administration in time range)  DULoxetine (CYMBALTA) DR capsule 30 mg (has no administration in time range)  mirtazapine (REMERON) tablet 30 mg (has no administration in time range)  traZODone (DESYREL) tablet 150 mg (has no administration in time range)  oxyCODONE (Oxy IR/ROXICODONE) immediate release tablet 5 mg (5 mg Oral Given 07/12/23 1815)  LORazepam (ATIVAN) tablet 1 mg (1 mg Oral Given 07/12/23 1841)  sodium chloride 0.9 % bolus 500 mL (500 mLs Intravenous New Bag/Given 07/12/23 2114)                                                                                                                                     Procedures Procedures  (including critical care time)  Medical Decision Making / ED Course   This patient presents to the ED  for concern of weakness and altered mental status, this involves  an extensive number of treatment options, and is a complaint that carries with it a high risk of complications and morbidity.  The differential diagnosis includes deconditioning, right hip pain, dehydration, electrolyte abnormalities, ICH, sepsis.  MDM: Broad workup for the patient.  Is endorsing pain in his right hip.  Patient does seem frail, is alert and oriented x 3.  Does appear to be dry.  He is tachycardic.  Will check a CK.  Blood cultures and lactic acid ordered.  Patient does not yet meet sepsis criteria.  CT imaging of the patient's head ordered.  Chest x-ray ordered.  Will perform infectious workup.  Reassessment 9 PM-patient's renal function at baseline, mildly decreased hemoglobin but no significant drop.  Patient's vital signs reassuring.  I reviewed the patient's labs, imaging.  At this time I do not believe he requires admission.  Discussed with the on-call hospitalist, Dr. Allena Katz, he agreed that the patient likely did not meet admission criteria.  Will place patient in TOC boarder status.  Will have him be seen by case management in the morning.  All home meds ordered.   Additional history obtained: -Additional history obtained from patient's nurse practitioner -External records from outside source obtained and reviewed including: Chart review including previous notes, labs, imaging, consultation notes   Lab Tests: -I ordered, reviewed, and interpreted labs.   The pertinent results include:   Labs Reviewed  CBC WITH DIFFERENTIAL/PLATELET - Abnormal; Notable for the following components:      Result Value   RBC 3.27 (*)    Hemoglobin 9.8 (*)    HCT 31.1 (*)    All other components within normal limits  COMPREHENSIVE METABOLIC PANEL - Abnormal; Notable for the following components:   Glucose, Bld 102 (*)    BUN 28 (*)    Creatinine, Ser 1.43 (*)    Total Protein 6.1 (*)    Albumin 3.2 (*)    GFR, Estimated  55 (*)    All other components within normal limits  URINALYSIS, ROUTINE W REFLEX MICROSCOPIC - Abnormal; Notable for the following components:   APPearance CLOUDY (*)    Hgb urine dipstick SMALL (*)    Protein, ur 30 (*)    Leukocytes,Ua LARGE (*)    Bacteria, UA RARE (*)    All other components within normal limits  RESP PANEL BY RT-PCR (RSV, FLU A&B, COVID)  RVPGX2  CULTURE, BLOOD (ROUTINE X 2)  CULTURE, BLOOD (ROUTINE X 2)  CK  LACTIC ACID, PLASMA  LACTIC ACID, PLASMA  CBG MONITORING, ED      EKG my independent review the patient's EKG shows no ST segment depressions or elevations, no T wave versions, no evidence of acute ischemia.  EKG Interpretation Date/Time:  Thursday July 12 2023 15:37:26 EST Ventricular Rate:  81 PR Interval:  152 QRS Duration:  87 QT Interval:  381 QTC Calculation: 443 R Axis:   27  Text Interpretation: Sinus rhythm Consider left ventricular hypertrophy Confirmed by Anders Simmonds [id number]) on 07/12/2023 9:55:20 PM         Imaging Studies ordered: I ordered imaging studies including CT imaging of the head, chest x-ray I independently visualized and interpreted imaging. I agree with the radiologist interpretation   Medicines ordered and prescription drug management: Meds ordered this encounter  Medications   oxyCODONE (Oxy IR/ROXICODONE) immediate release tablet 5 mg    Refill:  0   LORazepam (ATIVAN) tablet 1 mg   sodium chloride 0.9 % bolus 500 mL  abiraterone acetate (ZYTIGA) tablet 250 mg   acetaminophen (TYLENOL) tablet 1,000 mg   alum & mag hydroxide-simeth (MAALOX/MYLANTA) 200-200-20 MG/5ML suspension 30 mL   amLODipine (NORVASC) tablet 10 mg   atorvastatin (LIPITOR) tablet 80 mg   cloNIDine (CATAPRES) tablet 0.1 mg   clopidogrel (PLAVIX) tablet 75 mg   cyclobenzaprine (FLEXERIL) tablet 5 mg   DULoxetine (CYMBALTA) DR capsule 30 mg   mirtazapine (REMERON) tablet 30 mg   traZODone (DESYREL) tablet 150 mg    -I have  reviewed the patients home medicines and have made adjustments as needed   Cardiac Monitoring: The patient was maintained on a cardiac monitor.  I personally viewed and interpreted the cardiac monitored which showed an underlying rhythm of:'s rhythm  Social Determinants of Health:  Factors impacting patients care include: Medical comorbidities including chronic deconditioning   Reevaluation: After the interventions noted above, I reevaluated the patient and found that they have :improved  Co morbidities that complicate the patient evaluation  Past Medical History:  Diagnosis Date   Hip osteoarthritis 07/10/2020   Hypertension    Stroke [facility])       Dispostion: I considered admission for this patient, however patient's workup does not meet admission criteria.  Will place into TOC boarder status.     Final Clinical Impression(s) / ED Diagnoses Final diagnoses:  Weakness     @PCDICTATION @    Anders Simmonds T, DO 07/12/23 2156

## 2023-07-12 NOTE — Discharge Instructions (Addendum)
Continue home medications as prescribed.  Return to the emergency department for any new or worsening symptoms of concern. 

## 2023-07-13 LAB — BLOOD CULTURE ID PANEL (REFLEXED) - BCID2

## 2023-07-13 MED ORDER — IBUPROFEN 200 MG PO TABS
200.0000 mg | ORAL_TABLET | Freq: Once | ORAL | Status: AC
  Administered 2023-07-13: 200 mg via ORAL
  Filled 2023-07-13: qty 1

## 2023-07-13 NOTE — Evaluation (Signed)
Occupational Therapy Evaluation Patient Details Name: Chad Santos MRN: [id number] DOB: 09/21/1960 Today's Date: 07/13/2023   History of Present Illness Chad Santos is a 62 y.o. male who presented with AMS. Pt's NP has concerns  for avascular necrosis of the patient's hip. PMHx: CVA, speptic ulcer disease with GI bleed, hypertension, tobacco abuse.   Clinical Impression   [name] was evaluated s/p the above admission list. He lives alone and reports being mod I with RW at baseline; per chart, pt was found in bed covered in urine. Upon evaluation the pt was limited by impaired cognition, poor balance, pain and limited activity toleracne. Overall he was able to get to the EOB with min A, stand and take side steps with mod A and HHA. Due to the deficits listed below the pt also needs up to max A for LB ADLs and min A for UB ADLs. Pt will benefit from continued acute OT services and skilled inpatient follow up therapy, <3 hours/day         If plan is discharge home, recommend the following: A lot of help with walking and/or transfers;A lot of help with bathing/dressing/bathroom;Assistance with cooking/housework;Assistance with feeding;Direct supervision/assist for medications management;Direct supervision/assist for financial management;Assist for transportation;Help with stairs or ramp for entrance    Functional Status Assessment  Patient has had a recent decline in their functional status and demonstrates the ability to make significant improvements in function in a reasonable and predictable amount of time.  Equipment Recommendations  Other (comment) (defer)       Precautions / Restrictions Precautions Precautions: Fall Restrictions Weight Bearing Restrictions Per Provider Order: No      Mobility Bed Mobility Overal bed mobility: Needs Assistance Bed Mobility: Sit to Supine, Supine to Sit     Supine to sit: Min assist Sit to supine: Min assist        Transfers Overall  transfer level: Needs assistance Equipment used: 2 person hand held assist Transfers: Sit to/from Stand Sit to Stand: Min assist           General transfer comment: side stepped, fatigues quickly      Balance Overall balance assessment: Needs assistance Sitting-balance support: Feet supported Sitting balance-Leahy Scale: Fair     Standing balance support: Bilateral upper extremity supported, During functional activity Standing balance-Leahy Scale: Poor                             ADL either performed or assessed with clinical judgement   ADL Overall ADL's : Needs assistance/impaired Eating/Feeding: Set up Eating/Feeding Details (indicate cue type and reason): pt with food in mouth the entire session, declined spitting it out Grooming: Contact guard assist   Upper Body Bathing: Contact guard assist   Lower Body Bathing: Moderate assistance   Upper Body Dressing : Contact guard assist   Lower Body Dressing: Moderate assistance   Toilet Transfer: Moderate assistance;Stand-pivot;Rolling walker (2 wheels)   Toileting- Clothing Manipulation and Hygiene: Maximal assistance;Bed level       Functional mobility during ADLs: Moderate assistance General ADL Comments: limited by BLE pain, balance and activity tolernace     Vision Baseline Vision/History: 0 No visual deficits Vision Assessment?: No apparent visual deficits     Perception Perception: Not tested       Praxis Praxis: Not tested       Pertinent Vitals/Pain Pain Assessment Pain Assessment: Faces Faces Pain Scale: Hurts even more Pain Location: knees and hip  Pain Descriptors / Indicators: Discomfort     Extremity/Trunk Assessment Upper Extremity Assessment Upper Extremity Assessment: Generalized weakness   Lower Extremity Assessment Lower Extremity Assessment: Defer to PT evaluation   Cervical / Trunk Assessment Cervical / Trunk Assessment: Kyphotic   Communication  Communication Communication: Difficulty communicating thoughts/reduced clarity of speech   Cognition Arousal: Alert Behavior During Therapy: Flat affect Overall Cognitive Status: No family/caregiver present to determine baseline cognitive functioning                                 General Comments: [facility] for basic orientation and command following. Difficulty answering PLOF and home set up questions     General Comments  VSS    Exercises     Shoulder Instructions      Home Living Family/patient expects to be discharged to:: Private residence Living Arrangements: Alone Available Help at Discharge: Family;Available PRN/intermittently Type of Home: House Home Access: Stairs to enter [employer] of Steps: 4         Bathroom Shower/Tub: Tub/shower unit             Additional Comments: home set up difficult to obtain      Prior Functioning/Environment Prior Level of Function : Needs assist             Mobility Comments: ambulates with RW ADLs Comments: pt reports mod I        OT Problem List: Decreased strength;Decreased range of motion;Decreased activity tolerance;Impaired balance (sitting and/or standing);Decreased coordination;Decreased cognition;Decreased safety awareness;Decreased knowledge of precautions;Decreased knowledge of use of DME or AE      OT Treatment/Interventions: Self-care/ADL training;Therapeutic exercise;DME and/or AE instruction;Therapeutic activities;Patient/family education;Balance training    OT Goals(Current goals can be found in the care plan section) Acute Rehab OT Goals Patient Stated Goal: to rest OT Goal Formulation: With patient Time For Goal Achievement: 07/27/23 Potential to Achieve Goals: Good ADL Goals Pt Will Perform Grooming: with supervision;standing Pt Will Perform Upper Body Dressing: with set-up Pt Will Perform Lower Body Dressing: with min assist Pt Will Transfer to Toilet: with min  assist;ambulating  OT Frequency: Min 1X/week    Co-evaluation              AM-PAC OT "6 Clicks" Daily Activity     Outcome Measure Help from another person eating meals?: A Little Help from another person taking care of personal grooming?: A Little Help from another person toileting, which includes using toliet, bedpan, or urinal?: A Lot Help from another person bathing (including washing, rinsing, drying)?: A Little Help from another person to put on and taking off regular upper body clothing?: A Little Help from another person to put on and taking off regular lower body clothing?: A Lot 6 Click Score: 16   End of Session Equipment Utilized During Treatment: Gait belt Nurse Communication: Mobility status  Activity Tolerance: Patient tolerated treatment well Patient left: in bed;with call bell/phone within reach;with bed alarm set  OT Visit Diagnosis: Unsteadiness on feet (R26.81);Other abnormalities of gait and mobility (R26.89);Muscle weakness (generalized) (M62.81);History of falling (Z91.81)                Time: 6578-4696 OT Time Calculation (min): 18 min Charges:  OT General Charges $OT Visit: 1 Visit OT Evaluation $OT Eval Moderate Complexity: 1 Mod  Derenda Mis, OTR/L Acute Rehabilitation Services Office [phone] Secure Chat Communication Preferred   Donia Pounds 07/13/2023, 3:50 PM

## 2023-07-13 NOTE — ED Notes (Signed)
Chad Santos from [facility] called for update.  Requesting SW reach out to her and keep her updated on POC.  Number placed in chart

## 2023-07-13 NOTE — Progress Notes (Signed)
PHARMACY - PHYSICIAN COMMUNICATION CRITICAL VALUE ALERT - BLOOD CULTURE IDENTIFICATION (BCID)  Chad Santos is an 62 y.o. male who presented to [facility] on 07/12/2023 with a chief complaint of AMS  Assessment: 65 YOM brought in after a wellness check with AMS. Some concern for avascular necrosis of the hip. No fevers/WBC and now with 1 of 4 blood cultures growing GPC with BCID detecting Strep species - could be a contaminant of pathogen.   Name of physician (or Provider) Contacted: Jarold Motto  Current antibiotics: None  Changes to prescribed antibiotics recommended:  None - recommend monitoring off antibiotics since stable, watch to see if other bottles turn positive  Results for orders placed or performed during the hospital encounter of 07/12/23  Blood Culture ID Panel (Reflexed) (Collected: 07/12/2023  5:00 PM)  Result Value Ref Range   Enterococcus faecalis NOT DETECTED NOT DETECTED   Enterococcus Faecium NOT DETECTED NOT DETECTED   Listeria monocytogenes NOT DETECTED NOT DETECTED   Staphylococcus species NOT DETECTED NOT DETECTED   Staphylococcus aureus (BCID) NOT DETECTED NOT DETECTED   Staphylococcus epidermidis NOT DETECTED NOT DETECTED   Staphylococcus lugdunensis NOT DETECTED NOT DETECTED   Streptococcus species DETECTED (A) NOT DETECTED   Streptococcus agalactiae NOT DETECTED NOT DETECTED   Streptococcus pneumoniae NOT DETECTED NOT DETECTED   Streptococcus pyogenes NOT DETECTED NOT DETECTED   A.calcoaceticus-baumannii NOT DETECTED NOT DETECTED   Bacteroides fragilis NOT DETECTED NOT DETECTED   Enterobacterales NOT DETECTED NOT DETECTED   Enterobacter cloacae complex NOT DETECTED NOT DETECTED   Escherichia coli NOT DETECTED NOT DETECTED   Klebsiella aerogenes NOT DETECTED NOT DETECTED   Klebsiella oxytoca NOT DETECTED NOT DETECTED   Klebsiella pneumoniae NOT DETECTED NOT DETECTED   Proteus species NOT DETECTED NOT DETECTED   Salmonella species NOT DETECTED NOT  DETECTED   Serratia marcescens NOT DETECTED NOT DETECTED   Haemophilus influenzae NOT DETECTED NOT DETECTED   Neisseria meningitidis NOT DETECTED NOT DETECTED   Pseudomonas aeruginosa NOT DETECTED NOT DETECTED   Stenotrophomonas maltophilia NOT DETECTED NOT DETECTED   Candida albicans NOT DETECTED NOT DETECTED   Candida auris NOT DETECTED NOT DETECTED   Candida glabrata NOT DETECTED NOT DETECTED   Candida krusei NOT DETECTED NOT DETECTED   Candida parapsilosis NOT DETECTED NOT DETECTED   Candida tropicalis NOT DETECTED NOT DETECTED   Cryptococcus neoformans/gattii NOT DETECTED NOT DETECTED    Thank you for allowing pharmacy to be a part of this patient's care.  Georgina Pillion, PharmD, BCPS, BCIDP Infectious Diseases Clinical Pharmacist 07/13/2023 12:14 PM   **Pharmacist phone directory can now be found on [email or web address] (PW [email or web address]).  Listed under [facility].

## 2023-07-13 NOTE — ED Provider Notes (Signed)
Emergency Medicine Observation Re-evaluation Note  Chad Santos is a 62 y.o. male, seen on rounds today.  Pt initially presented to the ED for complaints of Altered Mental Status Currently, the patient is not having any acute complaints.  Physical Exam  BP (!) 133/91 (BP Location: Right Arm)   Pulse 83   Temp 98.3 F (36.8 C) (Oral)   Resp 17   Ht 6\' 2"  (1.88 m)   Wt 87.5 kg   SpO2 100%   BMI 24.77 kg/m  Physical Exam General: Resting comfortably in stretcher Lungs: Normal work of breathing Psych: Calm  ED Course / MDM  EKG:EKG Interpretation Date/Time:  Thursday July 12 2023 15:37:26 EST Ventricular Rate:  81 PR Interval:  152 QRS Duration:  87 QT Interval:  381 QTC Calculation: 443 R Axis:   27  Text Interpretation: Sinus rhythm Consider left ventricular hypertrophy Confirmed by Anders Simmonds [phone]) on 07/12/2023 9:55:20 PM  I have reviewed the labs performed to date as well as medications administered while in observation.  Recent changes in the last 24 hours include came in with failure to thrive was medically cleared yesterday.  Plan  Current plan is for suspect he will need placement.  I have placed PT and OT consults.  Med reconciliation also placed.    Rondel Baton, MD 07/13/23 [phone]

## 2023-07-13 NOTE — ED Provider Notes (Signed)
Is growing strep species in 1 out of 4 bottles.  Likely contaminant.  Patient reexamined from head to toe.  No areas of cellulitis.  No sacral ulcers or signs of Fournier's.  No ulcers on his heel.  Skin appears to be intact everywhere else without signs of cellulitis.  He does not have any long-term indwelling lines and is not on dialysis.  Discussed with pharmacy and RPH.  Will hold off on antibiotics for now.   Rondel Baton, MD 07/13/23 1311

## 2023-07-14 MED ORDER — ALPRAZOLAM 0.25 MG PO TABS
0.5000 mg | ORAL_TABLET | Freq: Two times a day (BID) | ORAL | Status: DC | PRN
  Administered 2023-07-14 – 2023-07-16 (×3): 0.5 mg via ORAL
  Filled 2023-07-14 (×3): qty 2

## 2023-07-14 MED ORDER — OXYCODONE HCL 5 MG PO TABS
5.0000 mg | ORAL_TABLET | Freq: Three times a day (TID) | ORAL | Status: DC | PRN
  Administered 2023-07-14 – 2023-07-15 (×4): 5 mg via ORAL
  Filled 2023-07-14 (×4): qty 1

## 2023-07-14 NOTE — ED Notes (Signed)
Pt care taken, he said that he can't sleep. Gave patient his prn medication.

## 2023-07-14 NOTE — ED Provider Notes (Signed)
Patient has been persistently calling out requesting pain medication.  I reviewed his PDMP and I do see that he is prescribed Xanax 0.5 mg twice daily for several months for anxiety, which was not reordered yet.  Therefore I have reordered this medication.  I have also ordered Q8H PRN oxycodone for breakthrough pain.   [name], MD 07/14/23 [phone]

## 2023-07-14 NOTE — Progress Notes (Addendum)
CSW completed SNF workup and faxed patient's clinical information out for review.  CSW spoke with patient via phone to discuss PT recommendations for SNF. Patient agreeable for CSW to initiate SNF search. Patient states he has previously been to [facility].  Patient currently has [facility] insurance coverage - only facilities in network [facility], [place] [facility], and [place].  CSW spoke with Lowella Bandy at [facility] who states there are no [facility] beds open at this time.  CSW sent message to Colin Mulders at [facility] requesting patient be reviewed for a possible bed offer.  Edwin Dada, MSW, LCSW Transitions of Care  Clinical Social Worker II [phone]

## 2023-07-14 NOTE — Evaluation (Signed)
Physical Therapy Evaluation Patient Details Name: Chad Santos MRN: [id number] DOB: 03/09/1961 Today's Date: 07/14/2023  History of Present Illness  Chad Santos is a 62 y.o. male who presented 07/12/23 with AMS. Pt's NP has concerns  for avascular necrosis of the patient's hip. PMHx: CVA, speptic ulcer disease with GI bleed, hypertension, tobacco abuse.   Clinical Impression  Pt presents with condition above and deficits mentioned below, see PT Problem List. Per OT eval, pt was mod I utilizing a RW at baseline, and he was residing alone in a house with 4 STE. Currently, pt demonstrates deficits in processing speed, awareness, gross strength, activity tolerance, and balance. He is at high risk for falls. He is limited by L hip/thigh pain and quick fatigue with standing activity. He limited placing weight through his L leg due to the pain. At this time, pt is requiring CGA for supine to sit transitions, minA for sit to supine transitions, min-modA for transfers, and modA to take a couple lateral steps at EOB with RW support. He could benefit from short-term inpatient rehab, < 3 hours/day. Will continue to follow acutely.      If plan is discharge home, recommend the following: A lot of help with walking and/or transfers;A lot of help with bathing/dressing/bathroom;Assistance with cooking/housework;Direct supervision/assist for medications management;Direct supervision/assist for financial management;Assist for transportation;Help with stairs or ramp for entrance   Can travel by private vehicle   Yes    Equipment Recommendations Other (comment) (defer to next venue of care)  Recommendations for Other Services       Functional Status Assessment Patient has had a recent decline in their functional status and demonstrates the ability to make significant improvements in function in a reasonable and predictable amount of time.     Precautions / Restrictions Precautions Precautions:  Fall Restrictions Weight Bearing Restrictions Per Provider Order: No      Mobility  Bed Mobility Overal bed mobility: Needs Assistance Bed Mobility: Sit to Supine, Supine to Sit     Supine to sit: Contact guard, HOB elevated Sit to supine: Min assist, HOB elevated   General bed mobility comments: Extra time and effort to ascend his trunk to sit up R edge of stretcher, CGA for safety. MinA needed to lift legs back to supine    Transfers Overall transfer level: Needs assistance Equipment used: Rolling walker (2 wheels) Transfers: Sit to/from Stand Sit to Stand: Min assist, Mod assist           General transfer comment: Pt stood 3x from edge of stretcher to RW. The first rep he needed modA to maintain his balance while transitioning his L hand to the RW. The final x2 reps he needed minA for balance. Pt needs cues to extend his hips and trunk to stand upright as he maintains a flexed posture.    Ambulation/Gait Ambulation/Gait assistance: Mod assist Gait Distance (Feet): 1 Feet Assistive device: Rolling walker (2 wheels) Gait Pattern/deviations: Step-to pattern, Decreased step length - right, Decreased step length - left, Decreased weight shift to left, Decreased stance time - left, Decreased dorsiflexion - left, Trunk flexed, Leaning posteriorly, Knee flexed in stance - left, Antalgic Gait velocity: reduced Gait velocity interpretation: <1.31 ft/sec, indicative of household ambulator   General Gait Details: Pt stands with a mild posterior lean and a flexed posture with an inferior gaze. Verbal and tactile cues provided to extend hips and stand upright. Pt avoids placing much weight through his L leg due to pain, holding it  in flexion with only his forefoot on the ground. Cued pt to push through RW to reduce pressure on L leg and thus pain. ModA needed for balance and to manage RW to step laterally along EOB. Pt only able to stand briefly before needing to sit due to fatigue and  pain  Stairs            Wheelchair Mobility     Tilt Bed    Modified Rankin (Stroke Patients Only)       Balance Overall balance assessment: Needs assistance Sitting-balance support: Feet supported Sitting balance-Leahy Scale: Fair     Standing balance support: Bilateral upper extremity supported, During functional activity Standing balance-Leahy Scale: Poor Standing balance comment: reliant on RW and min-modA                             Pertinent Vitals/Pain Pain Assessment Pain Assessment: Faces Faces Pain Scale: Hurts even more Pain Location: L hip and thigh Pain Descriptors / Indicators: Discomfort, Grimacing, Guarding, Moaning Pain Intervention(s): Limited activity within patient's tolerance, Monitored during session, Repositioned    Home Living Family/patient expects to be discharged to:: Private residence Living Arrangements: Alone Available Help at Discharge: Family;Available PRN/intermittently Type of Home: House Home Access: Stairs to enter   [employer] of Steps: 4       Additional Comments: home set up difficult to obtain; info carried over from OT Eval    Prior Function Prior Level of Function : Needs assist (info carried over from OT Eval)             Mobility Comments: ambulates with RW ADLs Comments: pt reports mod I     Extremity/Trunk Assessment   Upper Extremity Assessment Upper Extremity Assessment: Defer to OT evaluation    Lower Extremity Assessment Lower Extremity Assessment: Generalized weakness (Gross MMT scores of 4 bil, denied numbness/tingling bil)    Cervical / Trunk Assessment Cervical / Trunk Assessment: Kyphotic  Communication   Communication Communication: Difficulty communicating thoughts/reduced clarity of speech  Cognition Arousal: Alert Behavior During Therapy: Flat affect Overall Cognitive Status: No family/caregiver present to determine baseline cognitive functioning                                  General Comments: Pt had initial difficulty identifying where his pain was in his L leg. Slow to process cues, but follows simple commands consistently.        General Comments General comments (skin integrity, edema, etc.): VSS (BP checked with positional changes) even though pt reporting feeling lightheaded    Exercises     Assessment/Plan    PT Assessment Patient needs continued PT services  PT Problem List Decreased strength;Decreased activity tolerance;Decreased balance;Decreased mobility;Decreased cognition;Pain       PT Treatment Interventions DME instruction;Gait training;Functional mobility training;Stair training;Therapeutic activities;Therapeutic exercise;Balance training;Neuromuscular re-education;Cognitive remediation;Patient/family education    PT Goals (Current goals can be found in the Care Plan section)  Acute Rehab PT Goals Patient Stated Goal: to improve PT Goal Formulation: With patient Time For Goal Achievement: 07/28/23 Potential to Achieve Goals: Good    Frequency Min 1X/week     Co-evaluation               AM-PAC PT "6 Clicks" Mobility  Outcome Measure Help needed turning from your back to your side while in a flat bed without using bedrails?:  A Little Help needed moving from lying on your back to sitting on the side of a flat bed without using bedrails?: A Little Help needed moving to and from a bed to a chair (including a wheelchair)?: A Lot Help needed standing up from a chair using your arms (e.g., wheelchair or bedside chair)?: A Lot Help needed to walk in hospital room?: Total Help needed climbing 3-5 steps with a railing? : Total 6 Click Score: 12    End of Session Equipment Utilized During Treatment: Gait belt Activity Tolerance: Patient limited by fatigue;Patient limited by pain Patient left: in bed;with call bell/phone within reach   PT Visit Diagnosis: Unsteadiness on feet (R26.81);Other  abnormalities of gait and mobility (R26.89);Muscle weakness (generalized) (M62.81);Difficulty in walking, not elsewhere classified (R26.2);Pain Pain - Right/Left: Left Pain - part of body: Hip    Time: 2952-8413 PT Time Calculation (min) (ACUTE ONLY): 14 min   Charges:   PT Evaluation $PT Eval Moderate Complexity: 1 Mod   PT General Charges $$ ACUTE PT VISIT: 1 Visit         Virgil Benedict, PT, DPT Acute Rehabilitation Services  Office: [phone]   Bettina Gavia 07/14/2023, 10:56 AM

## 2023-07-14 NOTE — ED Notes (Signed)
Pt states that he is feeling anxious and is requesting meds.

## 2023-07-14 NOTE — NC FL2 (Signed)
[place] MEDICAID FL2 LEVEL OF CARE FORM     IDENTIFICATION  Patient Name: Chad Santos Birthdate: Nov 10, 1960 Sex: male Admission Date (Current Location): 07/12/2023  [facility] and [place] Number:  Producer, television/film/video and Address:  [facility], [address] [place], [place] [address]      Provider Number: [id number]  Attending Physician Name and Address:  System, Provider Not In  Relative Name and Phone Number:       Current Level of Care: Hospital Recommended Level of Care: Skilled Nursing Facility Prior Approval Number:    Date Approved/Denied:   PASRR Number: [id number]  Discharge Plan: SNF    Current Diagnoses: Patient Active Problem List   Diagnosis Date Noted   Left-sided weakness 01/31/2022   Depression 01/23/2022   Vitamin D deficiency 01/23/2022   Hypotension 01/23/2022   Acute ischemic stroke (HCC) 01/01/2022   Acute CVA (cerebrovascular accident) (HCC) 12/28/2021   Upper GI bleeding 08/21/2021   Elevated troponin 08/21/2021   Bilateral leg pain 08/21/2021   Agitation 08/21/2021   Blood in the stool    Normocytic anemia     Orientation RESPIRATION BLADDER Height & Weight     Self, Time, Situation, Place  Normal Continent Weight: 192 lb 14.4 oz (87.5 kg) Height:  6\' 2"  (188 cm)  BEHAVIORAL SYMPTOMS/MOOD NEUROLOGICAL BOWEL NUTRITION STATUS      Continent Diet (See discharge summary)  AMBULATORY STATUS COMMUNICATION OF NEEDS Skin   Extensive Assist   Normal                       Personal Care Assistance Level of Assistance  Bathing, Feeding, Dressing Bathing Assistance: Maximum assistance Feeding assistance: Limited assistance Dressing Assistance: Maximum assistance     Functional Limitations Info  Sight, Hearing, Speech Sight Info: Adequate   Speech Info: Adequate    SPECIAL CARE FACTORS FREQUENCY  PT (By licensed PT), OT (By licensed OT)     PT Frequency: 5x weekly OT Frequency: 5x weekly             Contractures Contractures Info: Not present    Additional Factors Info  Code Status, Allergies Code Status Info: Full Code Allergies Info: Penicillins           Current Medications (07/14/2023):  This is the current hospital active medication list Current Facility-Administered Medications  Medication Dose Route Frequency Provider Last Rate Last Admin   abiraterone acetate (ZYTIGA) tablet 250 mg  250 mg Oral Daily Anders Simmonds T, DO       acetaminophen (TYLENOL) tablet 1,000 mg  1,000 mg Oral Q8H Anders Simmonds T, DO   1,000 mg at 07/13/23 2201   ALPRAZolam Prudy Feeler) tablet 0.5 mg  0.5 mg Oral BID PRN [name], MD       alum & mag hydroxide-simeth (MAALOX/MYLANTA) 200-200-20 MG/5ML suspension 30 mL  30 mL Oral Q4H PRN Anders Simmonds T, DO       amLODipine (NORVASC) tablet 10 mg  10 mg Oral Daily Anders Simmonds T, DO   10 mg at 07/14/23 0945   atorvastatin (LIPITOR) tablet 80 mg  80 mg Oral QHS Anders Simmonds T, DO   80 mg at 07/13/23 2201   cloNIDine (CATAPRES) tablet 0.1 mg  0.1 mg Oral BID Anders Simmonds T, DO   0.1 mg at 07/14/23 0946   clopidogrel (PLAVIX) tablet 75 mg  75 mg Oral Daily Anders Simmonds T, DO   75 mg at  07/14/23 0945   cyclobenzaprine (FLEXERIL) tablet 5 mg  5 mg Oral Q8H PRN Anders Simmonds T, DO   5 mg at 07/14/23 0335   DULoxetine (CYMBALTA) DR capsule 30 mg  30 mg Oral Daily Anders Simmonds T, DO   30 mg at 07/14/23 0945   mirtazapine (REMERON) tablet 30 mg  30 mg Oral QHS Anders Simmonds T, DO   30 mg at 07/13/23 2201   oxyCODONE (Oxy IR/ROXICODONE) immediate release tablet 5 mg  5 mg Oral Q8H PRN [name], MD   5 mg at 07/14/23 0945   traZODone (DESYREL) tablet 150 mg  150 mg Oral QHS PRN Anders Simmonds T, DO   150 mg at 07/14/23 2956   Current Outpatient Medications  Medication Sig Dispense Refill   abiraterone acetate (ZYTIGA) 250 MG tablet Take 250 mg by mouth in the morning, at noon, in the evening, and at bedtime. Take on  an empty stomach 1 hour before or 2 hours after a meal (Patient not taking: Reported on 07/13/2023)     acetaminophen (TYLENOL) 500 MG tablet Take 1,000 mg by mouth every 8 (eight) hours. (Patient not taking: Reported on 07/13/2023)     alum & mag hydroxide-simeth (MAALOX/MYLANTA) 200-200-20 MG/5ML suspension Take 30 mLs by mouth every 4 (four) hours as needed for indigestion or heartburn. (Patient not taking: Reported on 07/13/2023)     amLODipine (NORVASC) 10 MG tablet Take 10 mg by mouth daily. (Patient not taking: Reported on 07/13/2023)     atorvastatin (LIPITOR) 80 MG tablet Take 1 tablet (80 mg total) by mouth at bedtime. (Patient not taking: Reported on 07/13/2023) 30 tablet 0   bacitracin ointment Apply 1 Application topically 2 (two) times daily. (Patient not taking: Reported on 07/13/2023) 120 g 0   butalbital-aspirin-caffeine (FIORINAL) 50-325-40 MG capsule Take 1 capsule by mouth every 6 (six) hours as needed for headache. (Patient not taking: Reported on 07/13/2023)     calcium carbonate (TUMS - DOSED IN MG ELEMENTAL CALCIUM) 500 MG chewable tablet Chew 2 tablets by mouth daily. (Patient not taking: Reported on 07/13/2023)     cloNIDine (CATAPRES) 0.1 MG tablet Take 0.1 mg by mouth 2 (two) times daily. (Patient not taking: Reported on 07/13/2023)     clopidogrel (PLAVIX) 75 MG tablet Take 1 tablet (75 mg total) by mouth daily. (Patient not taking: Reported on 07/13/2023) 30 tablet 0   cyclobenzaprine (FLEXERIL) 5 MG tablet Take 5 mg by mouth every 8 (eight) hours as needed for muscle spasms. (Patient not taking: Reported on 07/13/2023)     DULoxetine (CYMBALTA) 30 MG capsule Take 30 mg by mouth daily. (Patient not taking: Reported on 07/13/2023)     lactulose (CHRONULAC) 10 GM/15ML solution Take 10 g by mouth every 12 (twelve) hours as needed for mild constipation, moderate constipation or severe constipation. (Patient not taking: Reported on 07/13/2023)     lidocaine (LIDODERM) 5 % Place  1 patch onto the skin daily. Remove & Discard patch within 12 hours or as directed by MD (Patient not taking: Reported on 07/13/2023) 30 patch 0   losartan (COZAAR) 50 MG tablet Take 100 mg by mouth daily. (Patient not taking: Reported on 07/13/2023)     mirtazapine (REMERON) 30 MG tablet Take 30 mg by mouth at bedtime. (Patient not taking: Reported on 07/13/2023)     nicotine (NICODERM CQ - DOSED IN MG/24 HOURS) 21 mg/24hr patch Place 21 mg onto the skin daily. (Patient not taking: Reported on 07/13/2023)  omeprazole (PRILOSEC OTC) 20 MG tablet Take 20 mg by mouth in the morning and at bedtime. (Patient not taking: Reported on 07/13/2023)     ondansetron (ZOFRAN) 4 MG tablet Take 4 mg by mouth every 6 (six) hours as needed for nausea or vomiting. (Patient not taking: Reported on 07/13/2023)     oxyCODONE-acetaminophen (PERCOCET/ROXICET) 5-325 MG tablet Take 1 tablet by mouth every 6 (six) hours as needed for severe pain (pain score 7-10). (Patient not taking: Reported on 07/13/2023) 15 tablet 0   polyethylene glycol (MIRALAX / GLYCOLAX) 17 g packet Take 17 g by mouth daily as needed for mild constipation, moderate constipation or severe constipation. (Patient not taking: Reported on 07/13/2023)     predniSONE (DELTASONE) 5 MG tablet Take 5 mg by mouth daily with breakfast. (Patient not taking: Reported on 07/13/2023)     traZODone (DESYREL) 150 MG tablet Take 1 tablet (150 mg total) by mouth at bedtime as needed for sleep. (Patient not taking: Reported on 07/13/2023) 30 tablet 1     Discharge Medications: Please see discharge summary for a list of discharge medications.  Relevant Imaging Results:  Relevant Lab Results:   Additional Information SS#: [id number]  Inis Sizer, LCSW

## 2023-07-15 MED ORDER — MELATONIN 3 MG PO TABS
3.0000 mg | ORAL_TABLET | Freq: Every day | ORAL | Status: DC
  Administered 2023-07-16: 3 mg via ORAL
  Filled 2023-07-15: qty 1

## 2023-07-15 NOTE — Progress Notes (Addendum)
2:30pm: CSW spoke with [place] at [facility] again who states patient can first thing tomorrow morning are there no beds available right now.  CSW spoke with Joy at [facility] to schedule transportation - Ander Slade states she will email transport staff to notify them of request.  1:40pm: CSW spoke with [place] at [facility] who states the facility can offer the patient a bed.  CSW spoke with patient to inform him of information.   CSW spoke with Joy at [facility] who states the agency can provide patient with transport to the facility when ready.  Edwin Dada, MSW, LCSW Transitions of Care  Clinical Social Worker II [phone]

## 2023-07-15 NOTE — ED Notes (Signed)
Joy from [facility] called to let us know they can arrange transport if the patient is transferred out today so we do not have to wait on PTAR. Her number is [phone].

## 2023-07-15 NOTE — ED Notes (Signed)
Per SW, patient will be transported 07-16-23 to [facility] via [facility] transport, exact time TBD. Number for [facility] is [phone].

## 2023-07-15 NOTE — ED Notes (Signed)
Patient linens changed, new brief placed, new condom catheter placed, new gown and monitoring attached. Patient c/o headache and wants sleeping pill. Patient repositioned in bed and warm blanket given, RN checking mar for medications.

## 2023-07-16 LAB — CBC WITH DIFFERENTIAL/PLATELET
Abs Immature Granulocytes: 0 10*3/uL (ref 0.00–0.07)
Basophils Absolute: 0 10*3/uL (ref 0.0–0.1)
Basophils Relative: 0 %
Eosinophils Absolute: 0.3 10*3/uL (ref 0.0–0.5)
Eosinophils Relative: 6 %
HCT: 30.7 % — ABNORMAL LOW (ref 39.0–52.0)
Hemoglobin: 9.7 g/dL — ABNORMAL LOW (ref 13.0–17.0)
Immature Granulocytes: 0 %
Lymphocytes Relative: 31 %
Lymphs Abs: 1.4 10*3/uL (ref 0.7–4.0)
MCH: 29.9 pg (ref 26.0–34.0)
MCHC: 31.6 g/dL (ref 30.0–36.0)
MCV: 94.8 fL (ref 80.0–100.0)
Monocytes Absolute: 0.3 10*3/uL (ref 0.1–1.0)
Monocytes Relative: 6 %
Neutro Abs: 2.6 10*3/uL (ref 1.7–7.7)
Neutrophils Relative %: 57 %
Platelets: 226 10*3/uL (ref 150–400)
RBC: 3.24 MIL/uL — ABNORMAL LOW (ref 4.22–5.81)
RDW: 13.6 % (ref 11.5–15.5)
WBC: 4.5 10*3/uL (ref 4.0–10.5)
nRBC: 0 % (ref 0.0–0.2)

## 2023-07-16 LAB — CULTURE, BLOOD (ROUTINE X 2)

## 2023-07-16 MED ORDER — CEFTRIAXONE SODIUM 1 G IJ SOLR
1.0000 g | Freq: Once | INTRAMUSCULAR | Status: AC
  Administered 2023-07-16: 1 g via INTRAVENOUS
  Filled 2023-07-16: qty 10

## 2023-07-16 NOTE — ED Notes (Signed)
Pt report given to Gilda Crease, [place].

## 2023-07-16 NOTE — ED Notes (Signed)
Pt placed on primofit, advised not to remove, pt resting at this time.

## 2023-07-16 NOTE — ED Provider Notes (Addendum)
Emergency Medicine Observation Re-evaluation Note  Chad Santos is a 62 y.o. male, seen on rounds today.  Pt initially presented to the ED for complaints of Altered Mental Status Currently, the patient is sitting in bed.  Physical Exam  BP (!) 165/75   Pulse 70   Temp 98.4 F (36.9 C) (Oral)   Resp 15   Ht 6\' 2"  (1.88 m)   Wt 87.5 kg   SpO2 97%   BMI 24.77 kg/m  Physical Exam General: Awake, alert, nondistressed Cardiac: Extremities well-perfused Lungs: Breathing is unlabored Psych: No agitation  ED Course / MDM  EKG:EKG Interpretation Date/Time:  Thursday July 12 2023 15:37:26 EST Ventricular Rate:  81 PR Interval:  152 QRS Duration:  87 QT Interval:  381 QTC Calculation: 443 R Axis:   27  Text Interpretation: Sinus rhythm Consider left ventricular hypertrophy Confirmed by Anders Simmonds [phone]) on 07/12/2023 9:55:20 PM  I have reviewed the labs performed to date as well as medications administered while in observation.  Recent changes in the last 24 hours include accepted for placement at [facility] with plan for transfer today.  Blood cultures from 4 days ago did grow strep salivarius.  Will consult ID.  Will repeat blood cultures and give a dose of ceftriaxone.  I spoke with infectious disease, Dr. Renold Don, who reviewed chart and suspect contaminant.  No treatment is recommended.  Plan will be to move forward with discharge to facility.  Plan  Current plan is for possible discharge today.    Gloris Manchester, MD 07/16/23 Libby Maw    Gloris Manchester, MD 07/16/23 [phone]

## 2023-07-16 NOTE — Progress Notes (Addendum)
10:30am: Per MD, patient is ready for discharge.  Patient will go to room 201A. Patient will be transported to [facility] via [facility]. The number to call for report is [phone].  9:30am: Per MD, patient not medically stable for discharge right now.  9am: CSW spoke with patient's [facility] social worker Bre McCoy who states she will notify [facility] transportation that patient is ready to be discharged to [facility].  CSW notified RN and MD of information.  8:20am: CSW attempted to reach Joy at [facility] without success - a voicemail was left requesting a return call.  Edwin Dada, MSW, LCSW Transitions of Care  Clinical Social Worker II [phone]

## 2023-07-17 ENCOUNTER — Other Ambulatory Visit: Payer: Self-pay

## 2023-07-17 ENCOUNTER — Telehealth (HOSPITAL_BASED_OUTPATIENT_CLINIC_OR_DEPARTMENT_OTHER): Payer: Self-pay

## 2023-07-17 ENCOUNTER — Ambulatory Visit (HOSPITAL_COMMUNITY): Payer: Medicare (Managed Care)

## 2023-07-17 ENCOUNTER — Emergency Department (HOSPITAL_COMMUNITY)
Admission: EM | Admit: 2023-07-17 | Discharge: 2023-07-17 | Disposition: A | Discharge status: Home / Self Care | Payer: Medicare (Managed Care) | Attending: Emergency Medicine | Admitting: Emergency Medicine

## 2023-07-17 ENCOUNTER — Ambulatory Visit (HOSPITAL_COMMUNITY): Admission: RE | Admit: 2023-07-17 | Payer: Medicare (Managed Care) | Source: Ambulatory Visit

## 2023-07-17 ENCOUNTER — Encounter (HOSPITAL_COMMUNITY): Payer: Self-pay

## 2023-07-17 DIAGNOSIS — Z7902 Long term (current) use of antithrombotics/antiplatelets: Secondary | ICD-10-CM | POA: Insufficient documentation

## 2023-07-17 DIAGNOSIS — R339 Retention of urine, unspecified: Secondary | ICD-10-CM | POA: Insufficient documentation

## 2023-07-17 DIAGNOSIS — Z79899 Other long term (current) drug therapy: Secondary | ICD-10-CM | POA: Diagnosis not present

## 2023-07-17 DIAGNOSIS — Z7982 Long term (current) use of aspirin: Secondary | ICD-10-CM | POA: Diagnosis not present

## 2023-07-17 DIAGNOSIS — K5900 Constipation, unspecified: Secondary | ICD-10-CM | POA: Diagnosis present

## 2023-07-17 LAB — CULTURE, BLOOD (ROUTINE X 2): Culture: NO GROWTH

## 2023-07-17 MED ORDER — ACETAMINOPHEN 500 MG PO TABS
1000.0000 mg | ORAL_TABLET | Freq: Once | ORAL | Status: AC
  Administered 2023-07-17: 1000 mg via ORAL
  Filled 2023-07-17: qty 2

## 2023-07-17 NOTE — ED Triage Notes (Signed)
EMS reports the patient called because he had urinary retention for 2 hours. Patient states he has been unable to have a bowel movement. Patient is from home and states he lives by hisself. While in triage patient was saying his left leg was hurting.

## 2023-07-17 NOTE — ED Notes (Signed)
PT has had another bowel movement.

## 2023-07-17 NOTE — ED Provider Notes (Signed)
[place] EMERGENCY DEPARTMENT AT [facility] Provider Note   CSN: [id number] Arrival date & time: 07/17/23  7829     History  Chief Complaint  Patient presents with   Constipation    Chad Santos is a 62 y.o. male.  Patient presents for concern of constipation and urinary retention.  Bowel movement since yesterday.  Does not endorse taking any medications at home.  No other complaints.  Denies nausea, vomiting.  No blood in the stool. No fever.  The history is provided by the patient. No language interpreter was used.       Home Medications Prior to Admission medications   Medication Sig Start Date End Date Taking? Authorizing Provider  abiraterone acetate (ZYTIGA) 250 MG tablet Take 250 mg by mouth in the morning, at noon, in the evening, and at bedtime. Take on an empty stomach 1 hour before or 2 hours after a meal Patient not taking: Reported on 07/13/2023    [provider]  acetaminophen (TYLENOL) 500 MG tablet Take 1,000 mg by mouth every 8 (eight) hours. Patient not taking: Reported on 07/13/2023    [provider]  alum & mag hydroxide-simeth (MAALOX/MYLANTA) 200-200-20 MG/5ML suspension Take 30 mLs by mouth every 4 (four) hours as needed for indigestion or heartburn. Patient not taking: Reported on 07/13/2023    [provider]  amLODipine (NORVASC) 10 MG tablet Take 10 mg by mouth daily. Patient not taking: Reported on 07/13/2023    [provider]  atorvastatin (LIPITOR) 80 MG tablet Take 1 tablet (80 mg total) by mouth at bedtime. Patient not taking: Reported on 07/13/2023 01/09/22   Milinda Antis, PA-C  bacitracin ointment Apply 1 Application topically 2 (two) times daily. Patient not taking: Reported on 07/13/2023 01/09/22   Valetta Fuller, Lynnell Jude, PA-C  butalbital-aspirin-caffeine [facility]) [phone] MG capsule Take 1 capsule by mouth every 6 (six) hours as needed for headache. Patient not taking: Reported on  07/13/2023    [provider]  calcium carbonate (TUMS - DOSED IN MG ELEMENTAL CALCIUM) 500 MG chewable tablet Chew 2 tablets by mouth daily. Patient not taking: Reported on 07/13/2023    [provider]  cloNIDine (CATAPRES) 0.1 MG tablet Take 0.1 mg by mouth 2 (two) times daily. Patient not taking: Reported on 07/13/2023    [provider]  clopidogrel (PLAVIX) 75 MG tablet Take 1 tablet (75 mg total) by mouth daily. Patient not taking: Reported on 07/13/2023 01/09/22   Milinda Antis, PA-C  cyclobenzaprine (FLEXERIL) 5 MG tablet Take 5 mg by mouth every 8 (eight) hours as needed for muscle spasms. Patient not taking: Reported on 07/13/2023    [provider]  DULoxetine (CYMBALTA) 30 MG capsule Take 30 mg by mouth daily. Patient not taking: Reported on 07/13/2023    [provider]  lactulose (CHRONULAC) 10 GM/15ML solution Take 10 g by mouth every 12 (twelve) hours as needed for mild constipation, moderate constipation or severe constipation. Patient not taking: Reported on 07/13/2023    [provider]  lidocaine (LIDODERM) 5 % Place 1 patch onto the skin daily. Remove & Discard patch within 12 hours or as directed by MD Patient not taking: Reported on 07/13/2023 05/25/23   Linwood Dibbles, MD  losartan (COZAAR) 50 MG tablet Take 100 mg by mouth daily. Patient not taking: Reported on 07/13/2023    [provider]  mirtazapine (REMERON) 30 MG tablet Take 30 mg by mouth at bedtime. Patient not taking: Reported  on 07/13/2023    [provider]  nicotine (NICODERM CQ - DOSED IN MG/24 HOURS) 21 mg/24hr patch Place 21 mg onto the skin daily. Patient not taking: Reported on 07/13/2023    [provider]  omeprazole (PRILOSEC OTC) 20 MG tablet Take 20 mg by mouth in the morning and at bedtime. Patient not taking: Reported on 07/13/2023    [provider]  ondansetron (ZOFRAN) 4 MG tablet Take 4 mg by mouth every  6 (six) hours as needed for nausea or vomiting. Patient not taking: Reported on 07/13/2023    [provider]  oxyCODONE-acetaminophen (PERCOCET/ROXICET) 5-325 MG tablet Take 1 tablet by mouth every 6 (six) hours as needed for severe pain (pain score 7-10). Patient not taking: Reported on 07/13/2023 05/25/23   Linwood Dibbles, MD  polyethylene glycol (MIRALAX / GLYCOLAX) 17 g packet Take 17 g by mouth daily as needed for mild constipation, moderate constipation or severe constipation. Patient not taking: Reported on 07/13/2023    [provider]  predniSONE (DELTASONE) 5 MG tablet Take 5 mg by mouth daily with breakfast. Patient not taking: Reported on 07/13/2023    [provider]  traZODone (DESYREL) 150 MG tablet Take 1 tablet (150 mg total) by mouth at bedtime as needed for sleep. Patient not taking: Reported on 07/13/2023 05/08/22   Debby Freiberg, NP      Allergies    Penicillins    Review of Systems   Review of Systems  Constitutional:  Negative for chills and fever.  Gastrointestinal:  Positive for constipation. Negative for abdominal pain, nausea and vomiting.  Genitourinary:  Positive for difficulty urinating. Negative for dysuria and flank pain.  All other systems reviewed and are negative.   Physical Exam Updated Vital Signs BP 135/72 (BP Location: Right Arm)   Pulse 88   Temp 98.5 F (36.9 C) (Oral)   Resp 16   SpO2 100%  Physical Exam Vitals and nursing note reviewed.  Constitutional:      General: He is not in acute distress.    Appearance: Normal appearance. He is not ill-appearing.  HENT:     Head: Normocephalic and atraumatic.     Nose: Nose normal.  Eyes:     Conjunctiva/sclera: Conjunctivae normal.  Cardiovascular:     Rate and Rhythm: Normal rate and regular rhythm.     Heart sounds: Normal heart sounds.  Pulmonary:     Effort: Pulmonary effort is normal. No respiratory distress.  Abdominal:     General: There is no  distension.     Palpations: Abdomen is soft.     Tenderness: There is no abdominal tenderness. There is no guarding.  Musculoskeletal:        General: No deformity. Normal range of motion.     Cervical back: Normal range of motion.  Skin:    Findings: No rash.  Neurological:     Mental Status: He is alert.     ED Results / Procedures / Treatments   Labs (all labs ordered are listed, but only abnormal results are displayed) Labs Reviewed - No data to display  EKG None  Radiology No results found.  Procedures Procedures    Medications Ordered in ED Medications  acetaminophen (TYLENOL) tablet 1,000 mg (1,000 mg Oral Given 07/17/23 9562)    ED Course/ Medical Decision Making/ A&P  Medical Decision Making Risk OTC drugs.   62 year old male presents today for concern of constipation and urinary retention.  Bladder scan was performed and patient had 0 mL in the bladder.  Prior to bladder scan he did ambulate.  He did have a bowel movement in the bathroom as well.  Abdominal exam was benign.  Hemodynamically stable.  He had a total of 2 bowel movements while in the emergency department.  Without distention on exam.  Doubt stool impaction.  Constipation seems to have resolved.  He was able to void in the emergency department.  No concern for urinary retention.  He is stable for discharge.  He is agreeable.  He states he does feel better.  Discharged in stable condition.  Return precaution discussed.  Patient voices understanding and is in agreement with plan.   Final Clinical Impression(s) / ED Diagnoses Final diagnoses:  Constipation, unspecified constipation type    Rx / DC Orders ED Discharge Orders     None         Marita Kansas, PA-C 07/17/23 0847    Gerhard Munch, MD 07/17/23 1119

## 2023-07-17 NOTE — ED Notes (Signed)
Pt has had bowel movement in the bathroom.  Refuses to wait for help to walk to the bathroom. Pt has climbed out of bed again and is walking with walker to the bathroom.

## 2023-07-17 NOTE — ED Notes (Signed)
Pt refuses bedside commode. Refuses urinal, Pt climbing out of bed despite rails being up. Posey alarm in place.

## 2023-07-17 NOTE — Progress Notes (Signed)
ED Antimicrobial Stewardship Positive Culture Follow Up   Chad Santos is an 62 y.o. male who presented to [facility] on 07/17/2023 with a chief complaint of  Chief Complaint  Patient presents with   Constipation    Recent Results (from the past 720 hours)  Urine Culture     Status: Abnormal   Collection Time: 06/23/23 11:21 AM   Specimen: Urine, Random  Result Value Ref Range Status   Specimen Description URINE, RANDOM  Final   Special Requests   Final    NONE Reflexed from [id number] Performed at [facility], [address], [place], [place] [address]    Culture MULTIPLE SPECIES PRESENT, SUGGEST RECOLLECTION (A)  Final   Report Status 06/24/2023 FINAL  Final  Blood culture (routine x 2)     Status: None   Collection Time: 07/12/23  4:33 PM   Specimen: BLOOD RIGHT HAND  Result Value Ref Range Status   Specimen Description BLOOD RIGHT HAND  Final   Special Requests   Final    BOTTLES DRAWN AEROBIC AND ANAEROBIC Blood Culture results may not be optimal due to an inadequate volume of blood received in culture bottles   Culture   Final    NO GROWTH 5 DAYS Performed at [facility], [address], [place], [place] [address]    Report Status 07/17/2023 FINAL  Final  Resp panel by RT-PCR (RSV, Flu A&B, Covid) Anterior Nasal Swab     Status: None   Collection Time: 07/12/23  4:54 PM   Specimen: Anterior Nasal Swab  Result Value Ref Range Status   SARS Coronavirus 2 by RT PCR NEGATIVE NEGATIVE Final   Influenza A by PCR NEGATIVE NEGATIVE Final   Influenza B by PCR NEGATIVE NEGATIVE Final    Comment: (NOTE) The Xpert Xpress SARS-CoV-2/FLU/RSV plus assay is intended as an aid in the diagnosis of influenza from Nasopharyngeal swab specimens and should not be used as a sole basis for treatment. Nasal washings and aspirates are unacceptable for Xpert Xpress SARS-CoV-2/FLU/RSV testing.  Fact Sheet for Patients: BloggerCourse.com  Fact Sheet  for Healthcare Providers: SeriousBroker.it  This test is not yet approved or cleared by the [place] FDA and has been authorized for detection and/or diagnosis of SARS-CoV-2 by FDA under an Emergency Use Authorization (EUA). This EUA will remain in effect (meaning this test can be used) for the duration of the COVID-19 declaration under Section 564(b)(1) of the Act, 21 U.S.C. section 360bbb-3(b)(1), unless the authorization is terminated or revoked.     Resp Syncytial Virus by PCR NEGATIVE NEGATIVE Final    Comment: (NOTE) Fact Sheet for Patients: BloggerCourse.com  Fact Sheet for Healthcare Providers: SeriousBroker.it  This test is not yet approved or cleared by the [place] FDA and has been authorized for detection and/or diagnosis of SARS-CoV-2 by FDA under an Emergency Use Authorization (EUA). This EUA will remain in effect (meaning this test can be used) for the duration of the COVID-19 declaration under Section 564(b)(1) of the Act, 21 U.S.C. section 360bbb-3(b)(1), unless the authorization is terminated or revoked.  Performed at [facility], [address], [place], [place] [address]   Blood culture (routine x 2)     Status: Abnormal   Collection Time: 07/12/23  5:00 PM   Specimen: BLOOD RIGHT WRIST  Result Value Ref Range Status   Specimen Description BLOOD RIGHT WRIST  Final   Special Requests   Final    BOTTLES DRAWN  AEROBIC AND ANAEROBIC Blood Culture results may not be optimal due to an inadequate volume of blood received in culture bottles   Culture  Setup Time   Final    GRAM POSITIVE COCCI IN PAIRS AND CHAINS ANAEROBIC BOTTLE ONLY CRITICAL RESULT CALLED TO, READ BACK BY AND VERIFIED WITH: PHARMD E.MARTIN AT 1141 ON 07/13/23 BY T.SAAD. Performed at [facility], [address], [place], [place] [address]    Culture STREPTOCOCCUS SALIVARIUS (A)  Final   Report  Status 07/16/2023 FINAL  Final   Organism ID, Bacteria STREPTOCOCCUS SALIVARIUS  Final      Susceptibility   Streptococcus salivarius - MIC*    PENICILLIN 0.5 INTERMEDIATE Intermediate     CEFTRIAXONE 0.25 SENSITIVE Sensitive     ERYTHROMYCIN <=0.12 SENSITIVE Sensitive     LEVOFLOXACIN 2 SENSITIVE Sensitive     VANCOMYCIN 0.5 SENSITIVE Sensitive     * STREPTOCOCCUS SALIVARIUS  Blood Culture ID Panel (Reflexed)     Status: Abnormal   Collection Time: 07/12/23  5:00 PM  Result Value Ref Range Status   Enterococcus faecalis NOT DETECTED NOT DETECTED Final   Enterococcus Faecium NOT DETECTED NOT DETECTED Final   Listeria monocytogenes NOT DETECTED NOT DETECTED Final   Staphylococcus species NOT DETECTED NOT DETECTED Final   Staphylococcus aureus (BCID) NOT DETECTED NOT DETECTED Final   Staphylococcus epidermidis NOT DETECTED NOT DETECTED Final   Staphylococcus lugdunensis NOT DETECTED NOT DETECTED Final   Streptococcus species DETECTED (A) NOT DETECTED Final    Comment: Not Enterococcus species, Streptococcus agalactiae, Streptococcus pyogenes, or Streptococcus pneumoniae. CRITICAL RESULT CALLED TO, READ BACK BY AND VERIFIED WITH: PHARMD E.MARTIN AT 1141 ON 07/13/23 BY T.SAAD.    Streptococcus agalactiae NOT DETECTED NOT DETECTED Final   Streptococcus pneumoniae NOT DETECTED NOT DETECTED Final   Streptococcus pyogenes NOT DETECTED NOT DETECTED Final   A.calcoaceticus-baumannii NOT DETECTED NOT DETECTED Final   Bacteroides fragilis NOT DETECTED NOT DETECTED Final   Enterobacterales NOT DETECTED NOT DETECTED Final   Enterobacter cloacae complex NOT DETECTED NOT DETECTED Final   Escherichia coli NOT DETECTED NOT DETECTED Final   Klebsiella aerogenes NOT DETECTED NOT DETECTED Final   Klebsiella oxytoca NOT DETECTED NOT DETECTED Final   Klebsiella pneumoniae NOT DETECTED NOT DETECTED Final   Proteus species NOT DETECTED NOT DETECTED Final   Salmonella species NOT DETECTED NOT DETECTED  Final   Serratia marcescens NOT DETECTED NOT DETECTED Final   Haemophilus influenzae NOT DETECTED NOT DETECTED Final   Neisseria meningitidis NOT DETECTED NOT DETECTED Final   Pseudomonas aeruginosa NOT DETECTED NOT DETECTED Final   Stenotrophomonas maltophilia NOT DETECTED NOT DETECTED Final   Candida albicans NOT DETECTED NOT DETECTED Final   Candida auris NOT DETECTED NOT DETECTED Final   Candida glabrata NOT DETECTED NOT DETECTED Final   Candida krusei NOT DETECTED NOT DETECTED Final   Candida parapsilosis NOT DETECTED NOT DETECTED Final   Candida tropicalis NOT DETECTED NOT DETECTED Final   Cryptococcus neoformans/gattii NOT DETECTED NOT DETECTED Final    Comment: Performed at [facility], [address], [place], [place] [address]  Blood culture (routine x 2)     Status: None (Preliminary result)   Collection Time: 07/16/23  9:45 AM   Specimen: BLOOD RIGHT HAND  Result Value Ref Range Status   Specimen Description BLOOD RIGHT HAND  Final   Special Requests   Final    BOTTLES DRAWN AEROBIC AND ANAEROBIC Blood Culture results may not be optimal due to  an inadequate volume of blood received in culture bottles   Culture   Final    NO GROWTH < 24 HOURS Performed at [facility], [address], [place], [place] [address]    Report Status PENDING  Incomplete  Blood culture (routine x 2)     Status: None (Preliminary result)   Collection Time: 07/16/23 10:00 AM   Specimen: BLOOD LEFT ARM  Result Value Ref Range Status   Specimen Description BLOOD LEFT ARM  Final   Special Requests   Final    BOTTLES DRAWN AEROBIC AND ANAEROBIC Blood Culture results may not be optimal due to an inadequate volume of blood received in culture bottles   Culture   Final    NO GROWTH < 24 HOURS Performed at [facility], [address], [place], [place] [address]    Report Status PENDING  Incomplete   26 YOM presented to the ED with altered mental status. Blood cultures were drawn  and resulted with strep salivarius in 1/4 bottles. Was given 1 dose of ceftriaxone in the ED and repeated blood cultures, no growth at 24 hours. Patient was discharged without antibiotics.  Blood culture results likely contaminate, no antibiotics indicated.  ED Provider: Marita Kansas, PA-C  Stephenie Acres, PharmD PGY1 Pharmacy Resident 07/17/2023 12:59 PM  Clinical Pharmacist Monday - Friday phone -  [phone] Saturday - Sunday phone - [phone]

## 2023-07-17 NOTE — Discharge Instructions (Addendum)
Your exam today was reassuring.  You had bowel movement in the emergency department.  Your bladder scan did not show any urine in your bladder.  Follow-up with your primary care provider.  Return for any concerning symptoms.  If you take pain medication at home it is best if you take a stool softener along with it to keep yourself from getting constipated.

## 2023-07-17 NOTE — ED Notes (Signed)
PT will not stay in the bed. He was assisted to the bathroom by tech and then this paramedic had to get a wheelchair to get him back to his room. Pt is now trying to get out of bed again. He was put back in bed and given water.

## 2023-07-17 NOTE — Telephone Encounter (Signed)
Post ED Visit - Positive Culture Follow-up  Culture report reviewed by antimicrobial stewardship pharmacist: [facility] [address] [place].D. []  Celedonio Miyamoto, Pharm.D., BCPS AQ-ID []  Garvin Fila, Pharm.D., BCPS []  Georgina Pillion, Pharm.D., BCPS []  [place], [address].D., BCPS, AAHIVP []  Estella Husk, Pharm.D., BCPS, AAHIVP []  Lysle Pearl, PharmD, BCPS []  Phillips Climes, PharmD, BCPS []  Agapito Games, PharmD, BCPS []  Verlan Friends, PharmD []  Mervyn Gay, PharmD, BCPS []  Vinnie Level, PharmD  [facility] []  Len Childs, PharmD []  Greer Pickerel, PharmD []  Adalberto Cole, PharmD []  Perlie Gold, Rph []  Lonell Face) Jean Rosenthal, PharmD []  Earl Many, PharmD []  Junita Push, PharmD []  Dorna Leitz, PharmD []  Terrilee Files, PharmD []  Lynann Beaver, PharmD []  Keturah Barre, PharmD []  Loralee Pacas, PharmD []  Bernadene Person, PharmD   Positive blood culture Reviewed by ED provider Marita Kansas, PA-C Positive 1/4 bottles likely contaminate. No need to treat and no further patient follow-up is required at this time.  Sandria Senter 07/17/2023, 3:15 PM

## 2023-07-21 LAB — CULTURE, BLOOD (ROUTINE X 2)
Culture: NO GROWTH
Culture: NO GROWTH

## 2023-08-07 ENCOUNTER — Ambulatory Visit (HOSPITAL_COMMUNITY)
Admission: RE | Admit: 2023-08-07 | Discharge: 2023-08-07 | Disposition: A | Discharge status: Home / Self Care | Payer: Medicare (Managed Care) | Source: Ambulatory Visit | Attending: Vascular Surgery | Admitting: Vascular Surgery

## 2023-08-07 ENCOUNTER — Encounter (HOSPITAL_COMMUNITY): Payer: Self-pay

## 2023-08-07 DIAGNOSIS — M25551 Pain in right hip: Secondary | ICD-10-CM

## 2023-08-15 ENCOUNTER — Other Ambulatory Visit (HOSPITAL_COMMUNITY): Payer: Self-pay | Admitting: Vascular Surgery

## 2023-08-15 ENCOUNTER — Ambulatory Visit (HOSPITAL_COMMUNITY)
Admission: RE | Admit: 2023-08-15 | Discharge: 2023-08-15 | Disposition: A | Discharge status: Home / Self Care | Payer: Medicare (Managed Care) | Source: Ambulatory Visit | Attending: Vascular Surgery | Admitting: Vascular Surgery

## 2023-08-15 DIAGNOSIS — M25551 Pain in right hip: Secondary | ICD-10-CM

## 2023-08-15 DIAGNOSIS — M25552 Pain in left hip: Secondary | ICD-10-CM | POA: Diagnosis present

## 2023-08-16 ENCOUNTER — Other Ambulatory Visit (HOSPITAL_COMMUNITY): Payer: Medicare (Managed Care)

## 2023-09-03 IMAGING — DX DG LUMBAR SPINE COMPLETE 4+V
4 series · 4 of 4 positions shown · non-contrast
Comparison: None.

CLINICAL DATA: lumbar radiculopathy, low back pain

EXAM:
LUMBAR SPINE - COMPLETE 4+ VIEW

[l-spine ap]
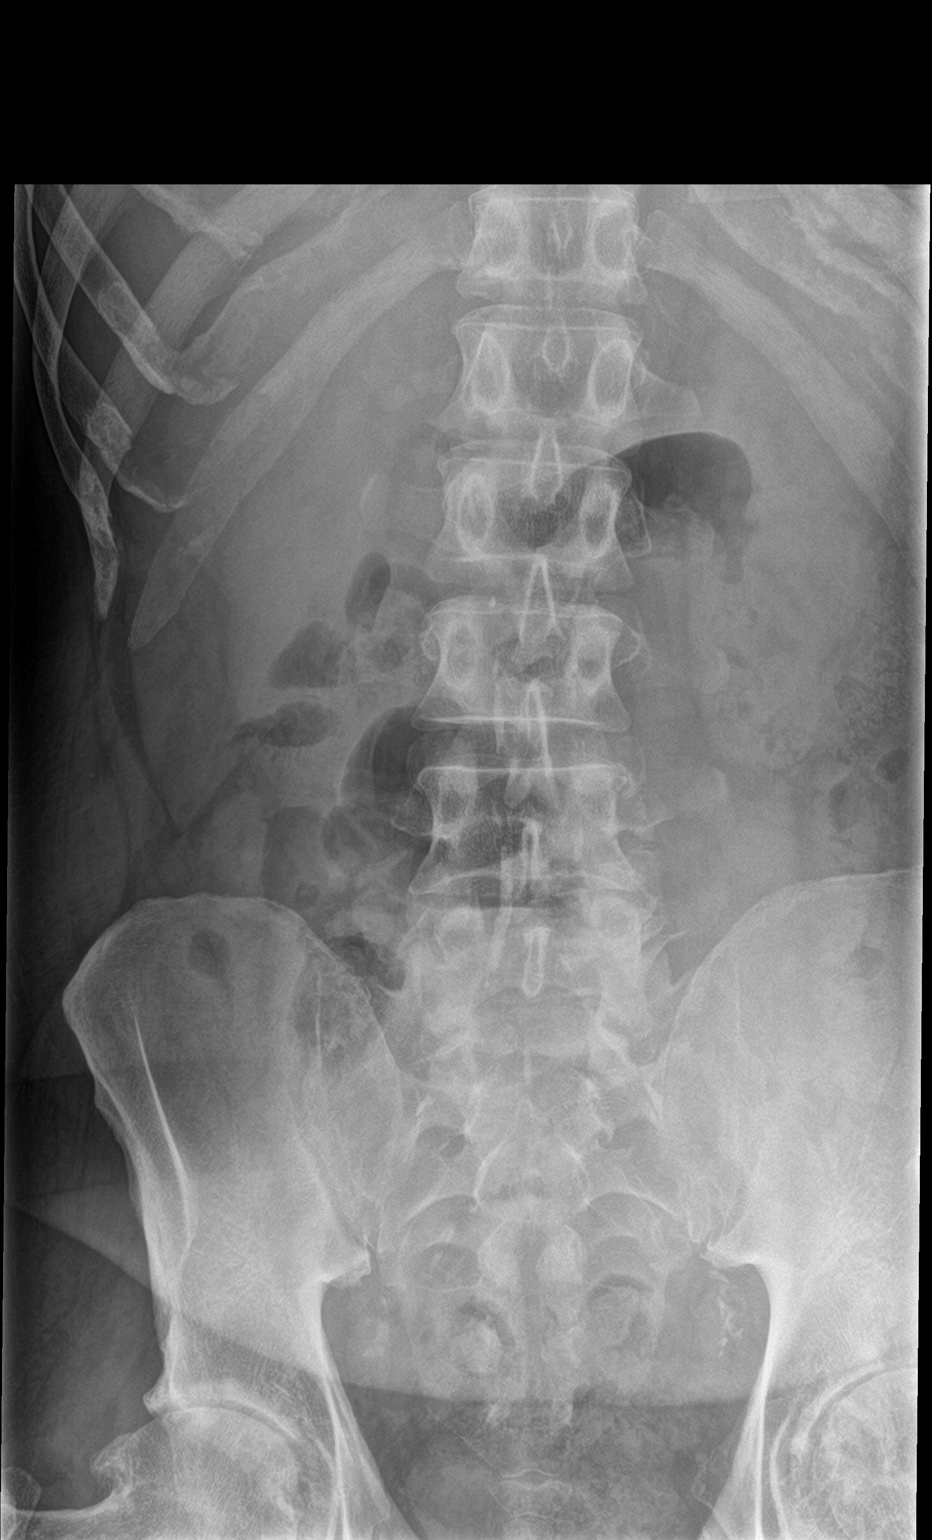

[l-spine obl (1 of 2)]
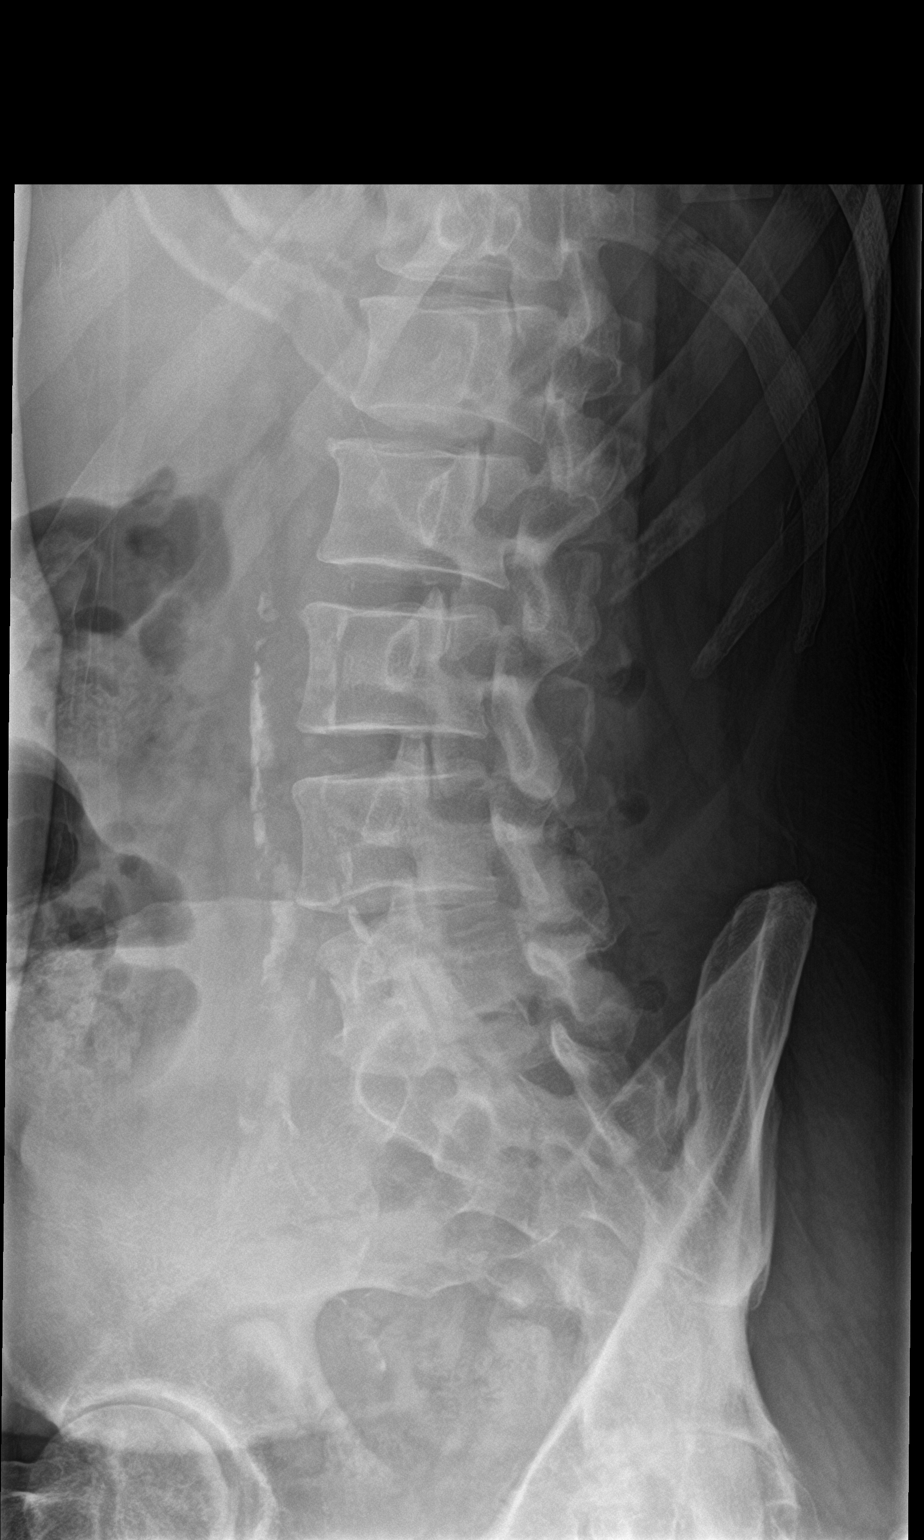

[l-spine obl (2 of 2)]
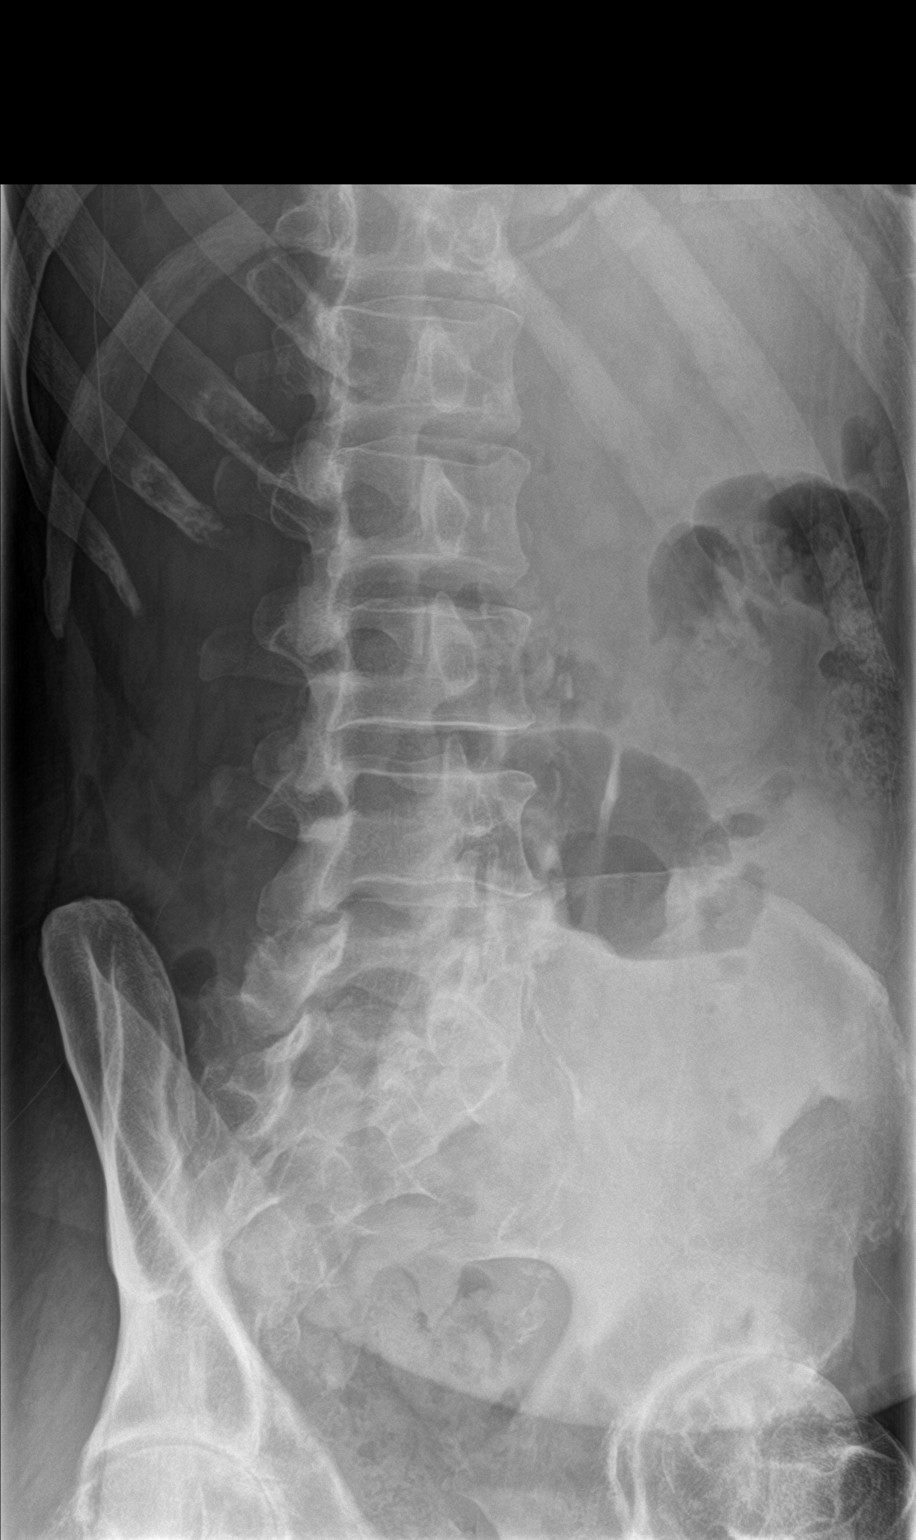

[l-spine lat]
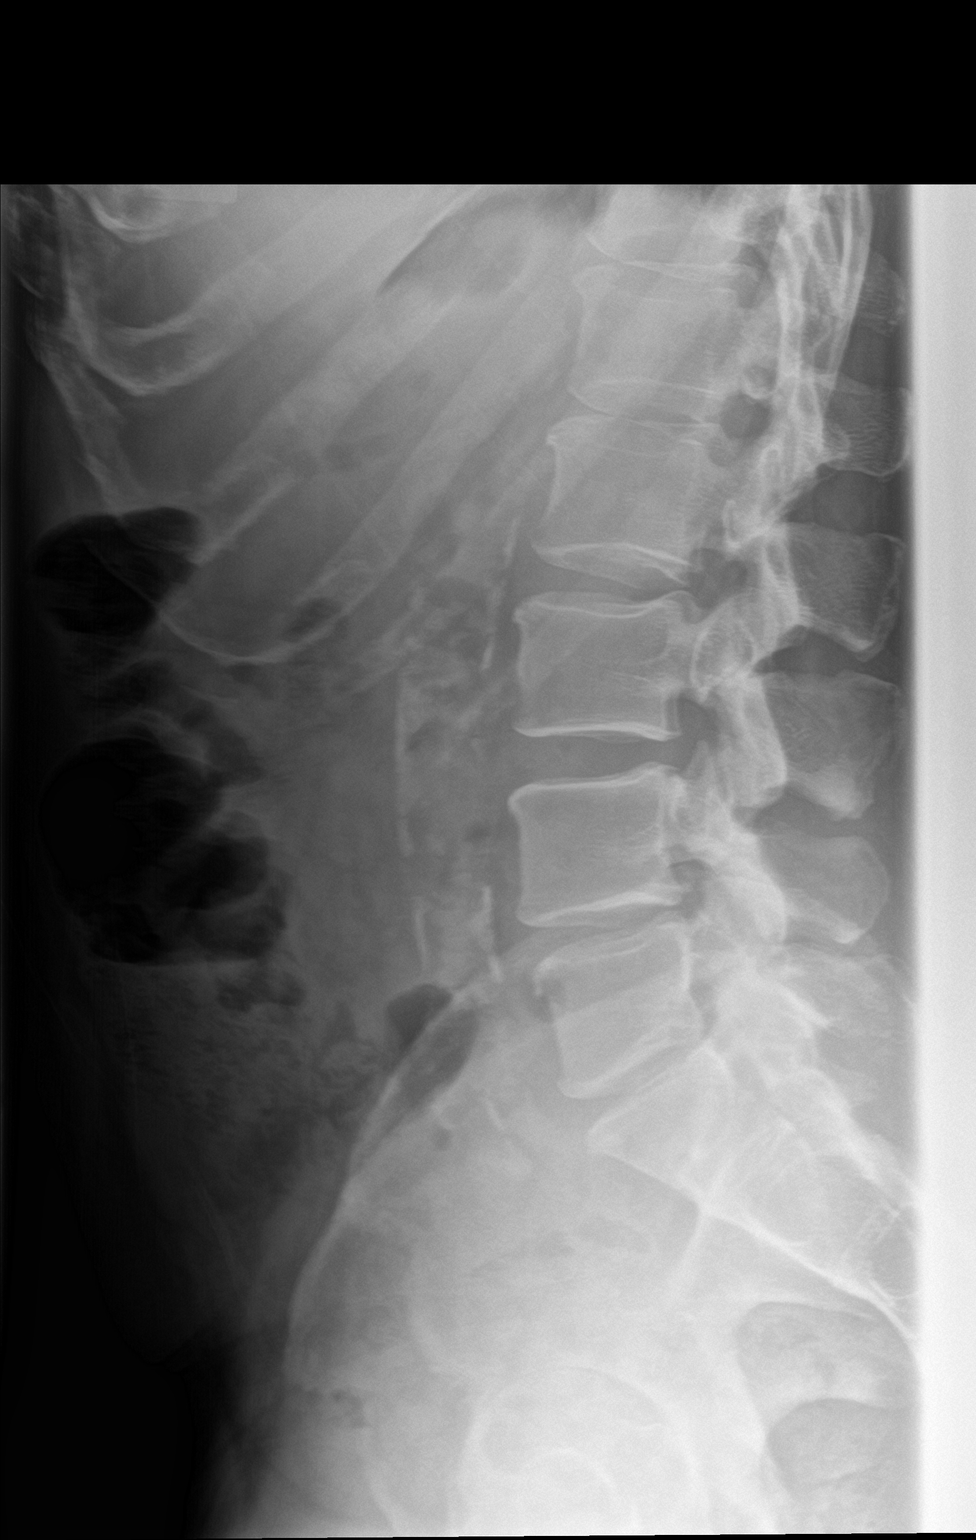

[4 of 4 positions shown; findings below may reference images not displayed]

FINDINGS: There is no evidence of lumbar spine fracture. Alignment is normal.
Intervertebral disc spaces are maintained. Atherosclerotic
calcification of the abdominal aorta.
IMPRESSION: Negative.

## 2024-02-22 ENCOUNTER — Emergency Department
Admission: EM | Admit: 2024-02-22 | Discharge: 2024-02-22 | Disposition: A | Discharge status: Home / Self Care | Payer: Medicare (Managed Care) | Attending: Emergency Medicine | Admitting: Emergency Medicine

## 2024-02-22 ENCOUNTER — Emergency Department: Payer: Medicare (Managed Care)

## 2024-02-22 ENCOUNTER — Other Ambulatory Visit: Payer: Self-pay

## 2024-02-22 DIAGNOSIS — I2510 Atherosclerotic heart disease of native coronary artery without angina pectoris: Secondary | ICD-10-CM | POA: Insufficient documentation

## 2024-02-22 DIAGNOSIS — R600 Localized edema: Secondary | ICD-10-CM | POA: Insufficient documentation

## 2024-02-22 DIAGNOSIS — R000 Tachycardia, unspecified: Secondary | ICD-10-CM | POA: Insufficient documentation

## 2024-02-22 DIAGNOSIS — R1084 Generalized abdominal pain: Secondary | ICD-10-CM | POA: Insufficient documentation

## 2024-02-22 LAB — LIPASE, BLOOD: Lipase: 27 U/L (ref 11–51)

## 2024-02-22 LAB — URINALYSIS, W/ REFLEX TO CULTURE (INFECTION SUSPECTED)
Bilirubin Urine: NEGATIVE
Glucose, UA: NEGATIVE mg/dL
Hgb urine dipstick: NEGATIVE
Ketones, ur: NEGATIVE mg/dL
Leukocytes,Ua: NEGATIVE
Nitrite: NEGATIVE
Protein, ur: NEGATIVE mg/dL
Specific Gravity, Urine: 1.021 (ref 1.005–1.030)
pH: 5 (ref 5.0–8.0)

## 2024-02-22 LAB — CBC WITH DIFFERENTIAL/PLATELET
Abs Immature Granulocytes: 0.03 K/uL (ref 0.00–0.07)
Basophils Absolute: 0.1 K/uL (ref 0.0–0.1)
Basophils Relative: 1 %
Eosinophils Absolute: 0.1 K/uL (ref 0.0–0.5)
Eosinophils Relative: 1 %
HCT: 32.8 % — ABNORMAL LOW (ref 39.0–52.0)
Hemoglobin: 10.9 g/dL — ABNORMAL LOW (ref 13.0–17.0)
Immature Granulocytes: 0 %
Lymphocytes Relative: 25 %
Lymphs Abs: 2.5 K/uL (ref 0.7–4.0)
MCH: 31.9 pg (ref 26.0–34.0)
MCHC: 33.2 g/dL (ref 30.0–36.0)
MCV: 95.9 fL (ref 80.0–100.0)
Monocytes Absolute: 0.7 K/uL (ref 0.1–1.0)
Monocytes Relative: 7 %
Neutro Abs: 6.5 K/uL (ref 1.7–7.7)
Neutrophils Relative %: 66 %
Platelets: 271 K/uL (ref 150–400)
RBC: 3.42 MIL/uL — ABNORMAL LOW (ref 4.22–5.81)
RDW: 13.4 % (ref 11.5–15.5)
WBC: 9.8 K/uL (ref 4.0–10.5)
nRBC: 0 % (ref 0.0–0.2)

## 2024-02-22 LAB — COMPREHENSIVE METABOLIC PANEL WITH GFR
ALT: 16 U/L (ref 0–44)
AST: 24 U/L (ref 15–41)
Albumin: 3.9 g/dL (ref 3.5–5.0)
Alkaline Phosphatase: 66 U/L (ref 38–126)
Anion gap: 11 (ref 5–15)
BUN: 30 mg/dL — ABNORMAL HIGH (ref 8–23)
CO2: 25 mmol/L (ref 22–32)
Calcium: 9.6 mg/dL (ref 8.9–10.3)
Chloride: 106 mmol/L (ref 98–111)
Creatinine, Ser: 1.09 mg/dL (ref 0.61–1.24)
GFR, Estimated: >60 mL/min (ref >60)
Glucose, Bld: 99 mg/dL (ref 70–99)
Potassium: 3.8 mmol/L (ref 3.5–5.1)
Sodium: 142 mmol/L (ref 135–145)
Total Bilirubin: 0.6 mg/dL (ref 0.0–1.2)
Total Protein: 6.7 g/dL (ref 6.5–8.1)

## 2024-02-22 LAB — MAGNESIUM: Magnesium: 1.6 mg/dL — ABNORMAL LOW (ref 1.7–2.4)

## 2024-02-22 LAB — D-DIMER, QUANTITATIVE: D-Dimer, Quant: 0.53 ug{FEU}/mL — ABNORMAL HIGH (ref 0.00–0.50)

## 2024-02-22 LAB — TROPONIN I (HIGH SENSITIVITY): Troponin I (High Sensitivity): 20 ng/L — ABNORMAL HIGH (ref <18)

## 2024-02-22 MED ORDER — FENTANYL CITRATE PF 50 MCG/ML IJ SOSY
50.0000 ug | PREFILLED_SYRINGE | Freq: Once | INTRAMUSCULAR | Status: AC
  Administered 2024-02-22: 50 ug via INTRAVENOUS
  Filled 2024-02-22: qty 1

## 2024-02-22 MED ORDER — LACTATED RINGERS IV BOLUS
1000.0000 mL | Freq: Once | INTRAVENOUS | Status: AC
  Administered 2024-02-22: 1000 mL via INTRAVENOUS

## 2024-02-22 MED ORDER — MAGNESIUM SULFATE IN D5W 1-5 GM/100ML-% IV SOLN
1.0000 g | Freq: Once | INTRAVENOUS | Status: AC
  Administered 2024-02-22: 1 g via INTRAVENOUS
  Filled 2024-02-22: qty 100

## 2024-02-22 MED ORDER — IOHEXOL 300 MG/ML  SOLN
100.0000 mL | Freq: Once | INTRAMUSCULAR | Status: AC | PRN
  Administered 2024-02-22: 100 mL via INTRAVENOUS

## 2024-02-22 NOTE — ED Notes (Signed)
 Patient repeatedly asking for sleep medications stating her has not slept all night.

## 2024-02-22 NOTE — Discharge Instructions (Signed)
 You were evaluated in the emergency department for leg and abdominal pain.  You had a CT scan of your abdomen that did not show any abnormalities to explain your pain today.  You do screening test to look for blood clots in your legs and that was negative.  Your lab work showed a low magnesium  level and you were given IV magnesium  replacement.  It is importantly follow-up with your primary care physician so they can repeat your lab work.  Return to the emergency department for any new or worsening symptoms.  Discussed with your primary care physician your ongoing abdominal pain, you may need a referral to a gastroenterologist.

## 2024-02-22 NOTE — ED Notes (Signed)
 Called [facility] for transport to [facility]

## 2024-02-22 NOTE — ED Notes (Signed)
 Patient called out requesting to use the restroom. Patient advised he is unable to walk and was placed on the bed pan as he stated he needed to have a bowel movement. It was noted that the patient had already had a bowel movement at some point in time as there were dried feces in the patient's brief. Patient was advised to use the call button when he finished so that he could be cleaned up.

## 2024-02-22 NOTE — ED Notes (Signed)
 Patient was noted to have two approximately golf ball sized lumps, one to the mid right buttock and the other to the upper left inner thigh area.

## 2024-02-22 NOTE — ED Triage Notes (Addendum)
 BIBEMS, coming from [facility]. c/o generalized ABD pain., L leg pain and HTN. Pt reports getting wrong medication from facility to EMS, facility states this is baseline.  PMH: chronic pain. 163/98 VSS. CBG: 88. 20LAC. GCS 15.

## 2024-02-22 NOTE — ED Provider Notes (Signed)
 [facility] Provider Note    Event Date/Time   First MD Initiated Contact with Patient 02/22/24 [phone]     (approximate)   History   Abdominal Pain   HPI  Chad Santos is a 63 y.o. male past medical significant for CAD, CVA, baseline weakness, history of GI bleed, chronic abdominal pain, who presents to the emergency department from nursing facility for abdominal pain and not feeling well.  Patient states that he is here because he was given the incorrect medication.  States that he was supposed to be given sleep medication but they did not give him a sleep medication.  EMS stated that he presented for lower abdominal pain and leg pain which is chronic for the patient.  States that he is at his normal mental status.  Patient complaining of pain to his lower abdomen and his legs.  Denies any falls or trauma.  Denies change in vision.  Denies any chest pain or shortness of breath.  Denies dysuria, urinary urgency or frequency.  He is uncertain of his home medications.     Physical Exam   Triage Vital Signs: ED Triage Vitals  Encounter Vitals Group     BP      Girls Systolic BP Percentile      Girls Diastolic BP Percentile      Boys Systolic BP Percentile      Boys Diastolic BP Percentile      Pulse      Resp      Temp      Temp src      SpO2      Weight      Height      Head Circumference      Peak Flow      Pain Score      Pain Loc      Pain Education      Exclude from Growth Chart     Most recent vital signs: Vitals:   02/22/24 1015 02/22/24 1030  BP:  (!) 157/113  Pulse: 89 90  Resp:    Temp:    SpO2: 99% 98%    Physical Exam Constitutional:      Appearance: He is well-developed.  HENT:     Head: Atraumatic.  Eyes:     Extraocular Movements: Extraocular movements intact.     Conjunctiva/sclera: Conjunctivae normal.     Pupils: Pupils are equal, round, and reactive to light.  Cardiovascular:     Rate and Rhythm: Regular  rhythm. Tachycardia present.  Pulmonary:     Effort: No respiratory distress.  Abdominal:     Tenderness: There is no abdominal tenderness.  Musculoskeletal:     Cervical back: Normal range of motion.     Right lower leg: Edema present.     Left lower leg: Edema present.     Comments: Left lower extremity pitting edema increased in size compared to the right lower extremity (chronic according to patient)  Skin:    General: Skin is warm.  Neurological:     Mental Status: He is alert. Mental status is at baseline.     Comments: Moving all extremities.  Cranial nerves grossly intact  Psychiatric:        Mood and Affect: Mood normal.     IMPRESSION / MDM / ASSESSMENT AND PLAN / ED COURSE  I reviewed the triage vital signs and the nursing notes.  Differential diagnosis including dehydration, chronic abdominal pain, constipation, urinary tract infection,  ACS, DVT  EKG  I, Clotilda Punter, the attending physician, personally viewed and interpreted this ECG.  EKG showed sinus tachycardia with heart rate of 116.  Narrow complex, normal intervals, no chamber enlargement.  No significant ST elevation or depression.  No findings of acute ischemia or dysrhythmia.  Sinus tachycardia while on cardiac telemetry.  RADIOLOGY CT scan of the abdomen and pelvis with contrast with no acute abnormality noted  LABS (all labs ordered are listed, but only abnormal results are displayed) Labs interpreted as -    Labs Reviewed  CBC WITH DIFFERENTIAL/PLATELET - Abnormal; Notable for the following components:      Result Value   RBC 3.42 (*)    Hemoglobin 10.9 (*)    HCT 32.8 (*)    All other components within normal limits  COMPREHENSIVE METABOLIC PANEL WITH GFR - Abnormal; Notable for the following components:   BUN 30 (*)    All other components within normal limits  URINALYSIS, W/ REFLEX TO CULTURE (INFECTION SUSPECTED) - Abnormal; Notable for the following components:   Color, Urine YELLOW  (*)    APPearance CLEAR (*)    Bacteria, UA RARE (*)    All other components within normal limits  D-DIMER, QUANTITATIVE - Abnormal; Notable for the following components:   D-Dimer, Quant 0.53 (*)    All other components within normal limits  MAGNESIUM  - Abnormal; Notable for the following components:   Magnesium  1.6 (*)    All other components within normal limits  TROPONIN I (HIGH SENSITIVITY) - Abnormal; Notable for the following components:   Troponin I (High Sensitivity) 20 (*)    All other components within normal limits  LIPASE, BLOOD     MDM  Tachycardic on arrival and hypertensive.  Did not arrive with a MAR so uncertain of his home medications.  Will give 1 L of IV fluids.  On chart review do not see that he is on any anticoagulation we will do a screening D-dimer to further risk stratify for DVT.  Clinical Course as of 02/22/24 1237  Fri Feb 22, 2024  1027 Age-adjusted D-dimer within normal limits, have a low suspicion for acute pulmonary embolism or DVT.  No signs of urinary tract infection.  No significant leukocytosis.  Anemia but hemoglobin is stable at 10.9.  Creatinine at baseline with normal LFTs. [SM]    Clinical Course User Index [SM] Punter Clotilda, MD   Troponin is elevated at 20 but chronically elevated and at his baseline.  Magnesium  level mildly low at 1.6, given IV magnesium  replacement.  Potassium within normal limits at 3.8.  No findings of acute kidney injury.  No leukocytosis.  Chronic anemia but stable and at baseline.  CT scan abdomen and pelvis without acute findings.  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  Patient initially with tachycardia.  EKG with sinus tachycardia.  Was given 1 L of IV fluids.  Heart rate improved with IV fluids.  Repeat abdominal exam with no focal abdominal tenderness to palpation and no rebound or guarding.  Pulses are intact and symmetric, no findings consistent with a dissection.  Plan to discharge back to facility.   Discussed return precautions and outpatient follow-up with gastroenterologist.  PROCEDURES:  Critical Care performed: No  Procedures  Patient's presentation is most consistent with acute presentation with potential threat to life or bodily function.   MEDICATIONS ORDERED IN ED: Medications  lactated ringers  bolus 1,000 mL (0 mLs Intravenous Stopped 02/22/24 1043)  magnesium  sulfate IVPB 1 g  100 mL (0 g Intravenous Stopped 02/22/24 1100)  fentaNYL (SUBLIMAZE) injection 50 mcg (50 mcg Intravenous Given 02/22/24 1016)  iohexol  (OMNIPAQUE ) 300 MG/ML solution 100 mL (100 mLs Intravenous Contrast Given 02/22/24 1108)    FINAL CLINICAL IMPRESSION(S) / ED DIAGNOSES   Final diagnoses:  Generalized abdominal pain     Rx / DC Orders   ED Discharge Orders     None        Note:  This document was prepared using Dragon voice recognition software and may include unintentional dictation errors.   Suzanne Kirsch, MD 02/22/24 [phone]
